# Patient Record
Sex: Male | Born: 1960 | Race: White | Hispanic: No | Marital: Married | Smoking: Never smoker
Health system: Southern US, Community
[De-identification: ages and names within clinical notes are randomized; demographics above are authoritative.]

## PROBLEM LIST (undated history)

## (undated) DIAGNOSIS — I10 Essential (primary) hypertension: Secondary | ICD-10-CM

## (undated) DIAGNOSIS — R45851 Suicidal ideations: Secondary | ICD-10-CM

## (undated) DIAGNOSIS — F419 Anxiety disorder, unspecified: Secondary | ICD-10-CM

## (undated) DIAGNOSIS — F1010 Alcohol abuse, uncomplicated: Secondary | ICD-10-CM

## (undated) DIAGNOSIS — F32A Depression, unspecified: Secondary | ICD-10-CM

## (undated) DIAGNOSIS — F329 Major depressive disorder, single episode, unspecified: Secondary | ICD-10-CM

## (undated) DIAGNOSIS — R001 Bradycardia, unspecified: Secondary | ICD-10-CM

## (undated) DIAGNOSIS — E669 Obesity, unspecified: Secondary | ICD-10-CM

## (undated) DIAGNOSIS — Z5900 Homelessness unspecified: Secondary | ICD-10-CM

## (undated) HISTORY — DX: Obesity, unspecified: E66.9

## (undated) HISTORY — DX: Bradycardia, unspecified: R00.1

## (undated) HISTORY — PX: OTHER SURGICAL HISTORY: SHX169

## (undated) HISTORY — PX: CHOLECYSTECTOMY: SHX55

## (undated) HISTORY — DX: Essential (primary) hypertension: I10

## (undated) HISTORY — PX: TONSILLECTOMY: SUR1361

## (undated) HISTORY — PX: GASTRIC BYPASS: SHX52

---

## 2005-02-04 HISTORY — PX: ROUX-EN-Y PROCEDURE: SUR1287

## 2007-04-03 ENCOUNTER — Inpatient Hospital Stay (HOSPITAL_COMMUNITY): Admission: RE | Admit: 2007-04-03 | Discharge: 2007-04-16 | Payer: Self-pay | Admitting: Psychiatry

## 2007-04-03 ENCOUNTER — Emergency Department (HOSPITAL_COMMUNITY): Admission: EM | Admit: 2007-04-03 | Discharge: 2007-04-03 | Payer: Self-pay | Admitting: Emergency Medicine

## 2007-04-03 ENCOUNTER — Ambulatory Visit: Payer: Self-pay | Admitting: Psychiatry

## 2009-07-26 ENCOUNTER — Emergency Department (HOSPITAL_COMMUNITY): Admission: EM | Admit: 2009-07-26 | Discharge: 2009-07-27 | Payer: Self-pay | Admitting: Emergency Medicine

## 2009-07-27 ENCOUNTER — Inpatient Hospital Stay (HOSPITAL_COMMUNITY): Admission: RE | Admit: 2009-07-27 | Discharge: 2009-07-31 | Payer: Self-pay | Admitting: Psychiatry

## 2009-07-27 ENCOUNTER — Ambulatory Visit: Payer: Self-pay | Admitting: Psychiatry

## 2010-04-22 LAB — COMPREHENSIVE METABOLIC PANEL
Alkaline Phosphatase: 113 U/L (ref 39–117)
BUN: 9 mg/dL (ref 6–23)
Chloride: 109 mEq/L (ref 96–112)
GFR calc non Af Amer: >60 mL/min (ref >60)
Glucose, Bld: 102 mg/dL — ABNORMAL HIGH (ref 70–99)
Potassium: 3.6 mEq/L (ref 3.5–5.1)
Total Bilirubin: 0.3 mg/dL (ref 0.3–1.2)

## 2010-04-22 LAB — DIFFERENTIAL
Basophils Absolute: 0.1 10*3/uL (ref 0.0–0.1)
Basophils Relative: 1 % (ref 0–1)
Neutro Abs: 5.5 10*3/uL (ref 1.7–7.7)
Neutrophils Relative %: 64 % (ref 43–77)

## 2010-04-22 LAB — CBC
HCT: 45 % (ref 39.0–52.0)
MCH: 30.3 pg (ref 26.0–34.0)
MCV: 85.5 fL (ref 78.0–100.0)
RBC: 5.26 MIL/uL (ref 4.22–5.81)
WBC: 8.6 10*3/uL (ref 4.0–10.5)

## 2010-04-22 LAB — RAPID URINE DRUG SCREEN, HOSP PERFORMED
Amphetamines: POSITIVE — AB
Barbiturates: NOT DETECTED
Cocaine: NOT DETECTED
Opiates: NOT DETECTED

## 2010-04-22 LAB — SALICYLATE LEVEL: Salicylate Lvl: <4 mg/dL (ref 2.8–20.0)

## 2010-06-19 NOTE — H&P (Signed)
NAME:  Derek [name], Derek [name] NO.:  [id number]   MEDICAL RECORD NO.:  [id number]          PATIENT TYPE:  IPS   LOCATION:  0501                          FACILITY:  [facility]   PHYSICIAN:  Geoffery Lyons, M.D.      DATE OF BIRTH:  [birth year]   DATE OF ADMISSION:  04/03/2007  DATE OF DISCHARGE:                       PSYCHIATRIC ADMISSION ASSESSMENT   This is a voluntary admission to the service of Dr. Geoffery Lyons.   IDENTIFYING INFORMATION:  This is a 50 year old, married, white male who  presented to the [facility] Emergency Department.  He reported he was  struggling with alcohol and depression.  He denied being suicidal.  He  states that he last saw his therapist, Dr. Owens Loffler, on Tuesday.  They were  talking about issues surrounding his mother and his mother's death and  on July 09, 2022 he realized that he was drinking bourbon at work, and he  left work to continue drinking, and went to the [employer] parking lot in  [place] and began crying.  He called his sister and his wife who  got him to the [facility] Emergency Department on Friday.  In the  emergency room, his blood alcohol level was 77.  His UDS was positive  for benzodiazepines but he is prescribed.   PAST PSYCHIATRIC HISTORY:  He completed a 25-day stay at [facility]  back in 05-Nov-2022.  He states he drank the next day.  He has taken  antidepressant medications for the past 7-8 years.  He had begun to  abuse alcohol before his mother died but it escalated after her death in  11-04-05.  Other important psychiatric information is his ex-  wife's suicide in 1997.  At that time they were separated.   SOCIAL HISTORY:  He has had a couple years of college.  He has been  married x2.  He has a 75-year-old and a 27-year-old son by this wife.  By  his prior wife, he has a 66 year old son and a 63 year old daughter.  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   FAMILY HISTORY:  His father, son, and uncle  all have alcoholism issues.  His own drinking history, he has been drinking a fifth daily for the  past 2 years.   PRIMARY CARE Johnella Crumm:  Dr. Williemae Area.   PSYCHIATRIST:  Dr. Tomasa Rand.   THERAPIST:  Michaelle Copas, Ph.D.   MEDICAL PROBLEMS:  1. He has hypertension and is treated.  2. He is status post a gastric bypass in January 2006.   CURRENT MEDICATIONS:  He is prescribed:  1. Antabuse 250 mg p.o. daily.  2. Campral 333 mg p.o. daily.  3. Diovan 80 mg p.o. daily.  4. Revia 50 mg p.o. daily.  5. Vitamin B12 p.o. daily.  6. Prozac 40 mg p.o. daily.   DRUG ALLERGIES:  No known drug allergies.   POSITIVE PHYSICAL FINDINGS:  He was medically cleared in the ED at  [facility].  As already stated, his blood alcohol level was  77.  His UDS was positive for benzos.  He has no hand tremor and no  CIWA.   Today, he denies any untoward symptoms.   MENTAL STATUS EXAMINATION:  He is alert and oriented.  He is casually  groomed in shorts and T-shirt.  He appears to be adequately nourished.  His speech is not slurred.  His mood is appropriate to the situation.  His affect has normal range.  Thought processes are clear, rational,  goal-oriented.  He realizes that he risks loosing family and employment  if he does not stop drinking.  Judgment and insight are good.  Concentration and memory are intact.  Intelligence is at least average.  He denies being suicidal or homicidal.  He denies auditory or visual  hallucinations.  He states that apparently crying is quite unusual.  He  did do that on Thursday and he wants to find out why he keeps sabotaging  himself.   DIAGNOSES:   AXIS I:  1. Alcohol dependence.  2. Alcohol withdrawal.  3. 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  4. Complicated bereavement.   AXIS II:  Deferred.   AXIS III:  1. History for gastric bypass.  2. Hypertension.   AXIS IV:  1. Severe problems with  primary support group.  2. Potential occupational issues.  3. He is status post a driving while intoxicated in November 2008, but      he does not know when his court date is.   PLAN:  1. Admit for safety and stabilization.  2. We will detox him from alcohol utilizing the low-dose Librium      protocol.  3. His Campral, Revia, and Prozac will be continued.  4. We will involve his family.  5. He will attend [facility].  6. We will consider adding a little Abilify.   ESTIMATED LENGTH OF STAY:  Four to six days.      Mickie Leonarda Salon, P.A.-C.      Geoffery Lyons, M.D.  Electronically Signed    MD/MEDQ  D:  04/04/2007  T:  04/05/2007  Job:  [id number]

## 2010-06-22 NOTE — Discharge Summary (Signed)
NAME:  Derek [name], ATES NO.:  [id number]   MEDICAL RECORD NO.:  [id number]          PATIENT TYPE:  IPS   LOCATION:  0501                          FACILITY:  [facility]   PHYSICIAN:  Geoffery Lyons, M.D.      DATE OF BIRTH:  1961-02-02   DATE OF ADMISSION:  04/03/2007  DATE OF DISCHARGE:  04/16/2007                               DISCHARGE SUMMARY   CHIEF COMPLAINT:  This was the first admission to [facility] for this 50 year-old, married, white male who presented to [facility] Emergency Department.  He reported he was struggling with alcohol  and depression.  He denied being suicidal.  He stated that he saw his  therapist, Dr. Owens Loffler on Tuesday.  They were talking about issues  surrounding his mother and his mother's death.  On 07-05-22,  he  realized that he was drinking at work.  He left work to continue  drinking and went to a [employer] parking lot in [place] and began  crying.  He called his sister and his wife who got him to [facility].  In the emergency room, the alcohol level was 77.   PAST PSYCHIATRIC HISTORY:  He had a 25-day stay in 11/01/22 and  although, he drank the next day.  He has been taking antidepressants for  the past 7-8 years.  He began to abuse alcohol before his mother died,  but escalated after her death in 31-Oct-2005.  Their are issues also  of his ex-wife's suicide in 1977.  At that time, they were separated.   FAMILY HISTORY:  Positive for alcohol dependence.   ALCOHOL AND DRUG HISTORY:  As already stated.  Persistent use of alcohol  drinking a fifth daily for the past 2 years.  Currently, seeing Dr.  Tomasa Rand.   PAST MEDICAL HISTORY:  1. Hypertension.  2. Status post gastric bypass in January 2006.   MEDICATIONS:  1. Antabuse 250 mg per day.  2. Compro 233 mg daily, 2-3 times a day.  3. Diovan 80 mg per day.  4. ReVia 50 mg per day.  5. Prozac 40 mg per day.   PHYSICAL EXAMINATION:  Physical exam failed  to show any acute findings.   LABORATORY DATA AND X-RAY FINDINGS:  Alcohol level 77.  UDS positive for  benzodiazepines.   MENTAL STATUS EXAM:  An alert, cooperative male, casually groomed,  adequately nourished.  Speech is normal rate, tempo and production.  Mood is depressed.  Affect depressed.  Thought process is clear,  rational and goal oriented.  Endorsed that he realized the risk of  losing his family as well as his job if he does not stop drinking.  No  active suicidal or homicidal ideas, no delusions, feeling very  overwhelmed. A lot of issues of loss and grief for the mother.  The ex-  wife died after committing suicide.  No hallucinations.  Cognition well  preserved.   ADMISSION DIAGNOSES:  AXIS I:  1.  Alcohol dependence.  1. Major depression, recurrent.  AXIS II:  No diagnosis.  AXIS III:  1.  History of gastric bypass.  1. Hypertension.  AXIS IV: Moderate.  AXIS V:  On admission 35; GAF in the last year 80.   COURSE IN THE HOSPITAL:  He was admitted and started in individual and  group psychotherapy. We pursued detox with Librium.  Initially, he was  placed on Abilify.  It was discontinued and we start working with  Seroquel.  As already stated, he is dealing with a lot of losses.  He  was able to open up and talk about the losses in his life.  There is a  lot of guilt associated to them.  Concerned that if he does not do  something about his life, he is going to lose his job and his family.  He has depressed mood dealing with the grief and his ex-wife's suicide  and loss of his mother.  Endorsed that he is not the same with lots of  self-doubt, a sense of hope and helplessness, very overwhelmed.  He  claimed he lost himself.  He relapsed shortly after coming out of  hall.  Several medication trials with Wellbutrin and Lexapro and  switched to Prozac.  He continued to deal with the loss and depression.  He slept through the morning, endorsed feeling sluggish, more  down than  usual.  He went to the loss group.  He was able to get, as he claims,  some good hints from the group in terms of thinking about what is he  missing from his mother.  Still hold on to the loss and unsure of what  has been taking so long to let go.  He did intellectualize and  rationalized.  He was focusing on the loss and minimizing the alcohol.  At that particular time, we held the Abilify due to the sluggishness and  considering adding the Wellbutrin to the Prozac.  By March 4, there was  increased pressure from his job in terms of getting an estimated length  of absence.  They were wanting to get him to take a less stressful  position at the job.  On March 4, he slept better and was more energetic  in the morning and went to group.  Then, he suddenly started feeling  depressed, down and started crying having to leave the group.  He was  found in a corner sitting and crying and endorsed he was talking to his  mother in his mind asking what to do.  He was able to talk about guilt,  about the wife's suicide, tending to intellectualize, needing a lot of  approval, apologizing for any displaced emotion.  He would not  acknowledge anger.  He then was involved in some behaviors stating that  he was not benefiting from the group, but yet able to say that in the  group, he was able to start dealing with some things, something  triggered the display of the emotion, but he is very uncomfortable with  it.  He did say that by now he would be drinking if he was to face this  state of affairs at home.  There was a family session with the wife.  He  continued to intellectualize, very controlled and uncomfortable  displaying emotions.  He was approaching his situation as he would  approach work in that he would like to fix it.  He does not feel that  he speaks on the time-frame he should be.  He did start projecting a lot  of anger.  He was seen by the psychiatric intern and the counselors  and  the Pharm-D.  There were definitely some characterological issues that  surfaced.  We worked with the Prozac and the Wellbutrin.  On March 6,  the psychologist met with him.  He was distressed about the realization  that he coped with feelings of inadequacy by putting on a front.  On  March 6, he endorsed he did not sleep at all last night thinking  ruminating, worrying and catastrophizing.  He wanted to address the  core issues that he has been helped to identify with while in this  unit, a sense of hopelessness and helplessness.  On March 9, he was  still very anxious.  Continues to admit that he is very depressed.  He  stated that he has tried to keep his defenses down and deny the way he  really feels and put on a front, but endorsed that he could not do this  anymore, feeling very overwhelmed.  Depressed mood with depressed  affect, tearful, hopeless, helpless, worried about going home, unable to  sleep due to the worries, concerns and ruminations.  We continued to  work with the Seroquel.  On March 10, endorsed overwhelming anxiety.  He  wakes up with anxiety, catastrophizing.  He requested the use of a  benzodiazepine short-term well aware of the dependency issues, but the  feeling was that if this was not addressed, that he was not able to move  forward.  We instituted a trial with a small amount of Klonopin on as a  needed basis, especially in the morning.  We upped our offerings in  terms of coping skills.  On March 11, he stayed in bed most of the  morning.  He was grieving the loss of his job as he knew.  It happened  that he was going to continue employment, but his responsibility was  going to be different.  He was probably going to continue on the same  salary, but he was not going to have as much responsibility.  A lot of  his self-esteem was tied up to this job and this was just one more loss.  The Klonopin did help.  He continued to evidence some characterological   issues and cognitive distortion that we addressed.  On March 12, he was  in full contact reality, no overt withdrawal.  After a lot of thinking  and going back and forth, the family was able to get some money  together.  He felt that he was ready to go deep and deal with his  issues.  He was not suicidal or homicidal.  He was excited to be able to  go into a residential program that might help him address his core  issues.   DISCHARGE DIAGNOSES:  AXIS I:  1.  Alcohol dependence.  1. Major depression, recurrent.  2. Anxiety disorder, not otherwise specified.  AXIS II:  Personality disorder, not otherwise specified.  AXIS III:  1.  Gastric bypass.  1. Hypertension.  AXIS IV:  Moderate.  AXIS V:  Upon discharge, 50.   DISCHARGE MEDICATIONS:  1. Diovan 80 mg per day.  2. ReVia 50 mg per day.  3. Prozac 60 mg per day.  4. Wellbutrin XL 300 mg daily.  5. Seroquel 100 at bedtime.   FOLLOW UP:  Follow up at [facility].  After discharge, he will pursue  outpatient treatment with Dr. Tomasa Rand.  Geoffery Lyons, M.D.  Electronically Signed     IL/MEDQ  D:  05/14/2007  T:  05/14/2007  Job:  [id number]

## 2010-10-26 LAB — RAPID URINE DRUG SCREEN, HOSP PERFORMED
Amphetamines: NOT DETECTED
Barbiturates: NOT DETECTED

## 2010-10-26 LAB — DIFFERENTIAL
Lymphocytes Relative: 16
Monocytes Absolute: 0.5
Monocytes Relative: 6
Neutro Abs: 7.2

## 2010-10-26 LAB — URINALYSIS, ROUTINE W REFLEX MICROSCOPIC
Hgb urine dipstick: NEGATIVE
Nitrite: NEGATIVE
Protein, ur: NEGATIVE
Urobilinogen, UA: 1

## 2010-10-26 LAB — CBC
Hemoglobin: 15.8
MCHC: 34.7
RBC: 5.11

## 2010-10-26 LAB — BASIC METABOLIC PANEL
CO2: 27
Calcium: 9.3
GFR calc Af Amer: >60
Sodium: 142

## 2010-10-29 LAB — HEPATIC FUNCTION PANEL
ALT: 20
AST: 26
Alkaline Phosphatase: 62
Bilirubin, Direct: 0.2
Total Bilirubin: 0.7

## 2015-02-05 HISTORY — PX: ROTATOR CUFF REPAIR: SHX139

## 2015-03-13 ENCOUNTER — Other Ambulatory Visit: Payer: Self-pay | Admitting: Orthopedic Surgery

## 2015-03-13 DIAGNOSIS — M67911 Unspecified disorder of synovium and tendon, right shoulder: Secondary | ICD-10-CM

## 2015-03-22 ENCOUNTER — Ambulatory Visit
Admission: RE | Admit: 2015-03-22 | Discharge: 2015-03-22 | Disposition: A | Discharge status: Home / Self Care | Payer: 59 | Source: Ambulatory Visit | Attending: Orthopedic Surgery | Admitting: Orthopedic Surgery

## 2015-03-22 DIAGNOSIS — M67911 Unspecified disorder of synovium and tendon, right shoulder: Secondary | ICD-10-CM

## 2015-03-30 ENCOUNTER — Other Ambulatory Visit: Payer: Self-pay | Admitting: Orthopedic Surgery

## 2015-04-13 ENCOUNTER — Encounter (HOSPITAL_BASED_OUTPATIENT_CLINIC_OR_DEPARTMENT_OTHER): Payer: Self-pay | Admitting: *Deleted

## 2015-04-13 NOTE — Progress Notes (Signed)
Bring all medications. Can not come for labs before surgery - going out of town. Explained will get labs and Ekg Monday.

## 2015-04-17 ENCOUNTER — Encounter (HOSPITAL_BASED_OUTPATIENT_CLINIC_OR_DEPARTMENT_OTHER)
Admission: RE | Disposition: A | Discharge status: Home / Self Care | Payer: Self-pay | Source: Ambulatory Visit | Attending: Orthopedic Surgery

## 2015-04-17 ENCOUNTER — Encounter (HOSPITAL_BASED_OUTPATIENT_CLINIC_OR_DEPARTMENT_OTHER): Payer: Self-pay

## 2015-04-17 ENCOUNTER — Ambulatory Visit (HOSPITAL_BASED_OUTPATIENT_CLINIC_OR_DEPARTMENT_OTHER): Payer: 59 | Admitting: Anesthesiology

## 2015-04-17 ENCOUNTER — Ambulatory Visit (HOSPITAL_BASED_OUTPATIENT_CLINIC_OR_DEPARTMENT_OTHER)
Admission: RE | Admit: 2015-04-17 | Discharge: 2015-04-17 | Disposition: A | Discharge status: Home / Self Care | Payer: 59 | Source: Ambulatory Visit | Attending: Orthopedic Surgery | Admitting: Orthopedic Surgery

## 2015-04-17 DIAGNOSIS — Z6835 Body mass index (BMI) 35.0-35.9, adult: Secondary | ICD-10-CM | POA: Diagnosis not present

## 2015-04-17 DIAGNOSIS — I10 Essential (primary) hypertension: Secondary | ICD-10-CM | POA: Diagnosis not present

## 2015-04-17 DIAGNOSIS — M75111 Incomplete rotator cuff tear or rupture of right shoulder, not specified as traumatic: Secondary | ICD-10-CM | POA: Diagnosis not present

## 2015-04-17 HISTORY — PX: SHOULDER ARTHROSCOPY WITH SUBACROMIAL DECOMPRESSION: SHX5684

## 2015-04-17 HISTORY — DX: Essential (primary) hypertension: I10

## 2015-04-17 HISTORY — DX: Depression, unspecified: F32.A

## 2015-04-17 HISTORY — DX: Major depressive disorder, single episode, unspecified: F32.9

## 2015-04-17 LAB — POCT I-STAT, CHEM 8
BUN: 7 mg/dL (ref 6–20)
CALCIUM ION: 1.04 mmol/L — AB (ref 1.12–1.23)
CHLORIDE: 105 mmol/L (ref 101–111)
CREATININE: 1 mg/dL (ref 0.61–1.24)
GLUCOSE: 109 mg/dL — AB (ref 65–99)
HCT: 46 % (ref 39.0–52.0)
Hemoglobin: 15.6 g/dL (ref 13.0–17.0)
POTASSIUM: 3.7 mmol/L (ref 3.5–5.1)
Sodium: 143 mmol/L (ref 135–145)
TCO2: 23 mmol/L (ref 0–100)

## 2015-04-17 SURGERY — SHOULDER ARTHROSCOPY WITH SUBACROMIAL DECOMPRESSION
Anesthesia: General | Site: Shoulder | Laterality: Right

## 2015-04-17 MED ORDER — EPHEDRINE SULFATE 50 MG/ML IJ SOLN
INTRAMUSCULAR | Status: DC | PRN
  Administered 2015-04-17 (×2): 10 mg via INTRAVENOUS

## 2015-04-17 MED ORDER — MIDAZOLAM HCL 2 MG/2ML IJ SOLN
1.0000 mg | INTRAMUSCULAR | Status: DC | PRN
  Administered 2015-04-17: 2 mg via INTRAVENOUS

## 2015-04-17 MED ORDER — BUPIVACAINE-EPINEPHRINE (PF) 0.5% -1:200000 IJ SOLN
INTRAMUSCULAR | Status: DC | PRN
  Administered 2015-04-17: 30 mL via PERINEURAL

## 2015-04-17 MED ORDER — SUCCINYLCHOLINE CHLORIDE 20 MG/ML IJ SOLN
INTRAMUSCULAR | Status: AC
  Filled 2015-04-17: qty 1

## 2015-04-17 MED ORDER — SODIUM CHLORIDE 0.9 % IR SOLN
Status: DC | PRN
  Administered 2015-04-17: 3000 mL

## 2015-04-17 MED ORDER — DEXAMETHASONE SODIUM PHOSPHATE 4 MG/ML IJ SOLN
INTRAMUSCULAR | Status: DC | PRN
  Administered 2015-04-17: 10 mg via INTRAVENOUS

## 2015-04-17 MED ORDER — MIDAZOLAM HCL 5 MG/5ML IJ SOLN
INTRAMUSCULAR | Status: DC | PRN
  Administered 2015-04-17: 2 mg via INTRAVENOUS

## 2015-04-17 MED ORDER — DOCUSATE SODIUM 100 MG PO CAPS: 100.0000 mg | ORAL_CAPSULE | Freq: Three times a day (TID) | ORAL | Status: DC | PRN

## 2015-04-17 MED ORDER — CEFAZOLIN SODIUM 1-5 GM-% IV SOLN
INTRAVENOUS | Status: AC
  Filled 2015-04-17: qty 50

## 2015-04-17 MED ORDER — LACTATED RINGERS IV SOLN: INTRAVENOUS | Status: DC

## 2015-04-17 MED ORDER — CEFAZOLIN SODIUM-DEXTROSE 2-3 GM-% IV SOLR
INTRAVENOUS | Status: AC
  Filled 2015-04-17: qty 50

## 2015-04-17 MED ORDER — FENTANYL CITRATE (PF) 100 MCG/2ML IJ SOLN
INTRAMUSCULAR | Status: DC | PRN
  Administered 2015-04-17: 100 ug via INTRAVENOUS

## 2015-04-17 MED ORDER — EPHEDRINE SULFATE 50 MG/ML IJ SOLN
INTRAMUSCULAR | Status: AC
  Filled 2015-04-17: qty 1

## 2015-04-17 MED ORDER — FENTANYL CITRATE (PF) 100 MCG/2ML IJ SOLN
50.0000 ug | INTRAMUSCULAR | Status: DC | PRN
  Administered 2015-04-17: 100 ug via INTRAVENOUS

## 2015-04-17 MED ORDER — SUCCINYLCHOLINE CHLORIDE 20 MG/ML IJ SOLN
INTRAMUSCULAR | Status: DC | PRN
  Administered 2015-04-17: 100 mg via INTRAVENOUS

## 2015-04-17 MED ORDER — PHENYLEPHRINE 40 MCG/ML (10ML) SYRINGE FOR IV PUSH (FOR BLOOD PRESSURE SUPPORT)
PREFILLED_SYRINGE | INTRAVENOUS | Status: AC
  Filled 2015-04-17: qty 10

## 2015-04-17 MED ORDER — SCOPOLAMINE 1 MG/3DAYS TD PT72: 1.0000 | MEDICATED_PATCH | Freq: Once | TRANSDERMAL | Status: DC | PRN

## 2015-04-17 MED ORDER — DEXAMETHASONE SODIUM PHOSPHATE 10 MG/ML IJ SOLN
INTRAMUSCULAR | Status: AC
  Filled 2015-04-17: qty 1

## 2015-04-17 MED ORDER — GLYCOPYRROLATE 0.2 MG/ML IJ SOLN
INTRAMUSCULAR | Status: AC
  Filled 2015-04-17: qty 1

## 2015-04-17 MED ORDER — FENTANYL CITRATE (PF) 100 MCG/2ML IJ SOLN
INTRAMUSCULAR | Status: AC
  Filled 2015-04-17: qty 2

## 2015-04-17 MED ORDER — ONDANSETRON HCL 4 MG/2ML IJ SOLN
INTRAMUSCULAR | Status: DC | PRN
  Administered 2015-04-17: 4 mg via INTRAVENOUS

## 2015-04-17 MED ORDER — PROPOFOL 10 MG/ML IV BOLUS
INTRAVENOUS | Status: AC
  Filled 2015-04-17: qty 20

## 2015-04-17 MED ORDER — LIDOCAINE HCL (CARDIAC) 20 MG/ML IV SOLN
INTRAVENOUS | Status: AC
  Filled 2015-04-17: qty 5

## 2015-04-17 MED ORDER — MIDAZOLAM HCL 2 MG/2ML IJ SOLN
INTRAMUSCULAR | Status: AC
  Filled 2015-04-17: qty 2

## 2015-04-17 MED ORDER — POVIDONE-IODINE 7.5 % EX SOLN: Freq: Once | CUTANEOUS | Status: DC

## 2015-04-17 MED ORDER — OXYCODONE-ACETAMINOPHEN 5-325 MG PO TABS: 1.0000 | ORAL_TABLET | ORAL | Status: DC | PRN

## 2015-04-17 MED ORDER — LACTATED RINGERS IV SOLN
INTRAVENOUS | Status: DC | PRN
  Administered 2015-04-17 (×2): via INTRAVENOUS

## 2015-04-17 MED ORDER — CEFAZOLIN SODIUM-DEXTROSE 2-3 GM-% IV SOLR
2.0000 g | INTRAVENOUS | Status: AC
  Administered 2015-04-17: 3 g via INTRAVENOUS

## 2015-04-17 MED ORDER — LIDOCAINE HCL (CARDIAC) 20 MG/ML IV SOLN
INTRAVENOUS | Status: DC | PRN
  Administered 2015-04-17: 50 mg via INTRAVENOUS

## 2015-04-17 MED ORDER — PROPOFOL 10 MG/ML IV BOLUS
INTRAVENOUS | Status: DC | PRN
  Administered 2015-04-17: 300 mg via INTRAVENOUS

## 2015-04-17 MED ORDER — GLYCOPYRROLATE 0.2 MG/ML IJ SOLN: 0.2000 mg | Freq: Once | INTRAMUSCULAR | Status: DC | PRN

## 2015-04-17 SURGICAL SUPPLY — 80 items
BENZOIN TINCTURE PRP APPL 2/3 (GAUZE/BANDAGES/DRESSINGS) IMPLANT
BLADE CLIPPER SURG (BLADE) IMPLANT
BLADE SURG 15 STRL LF DISP TIS (BLADE) IMPLANT
BLADE SURG 15 STRL SS (BLADE)
BUR OVAL 4.0 (BURR) ×2 IMPLANT
CANNULA 5.75X71 LONG (CANNULA) ×2 IMPLANT
CANNULA TWIST IN 8.25X7CM (CANNULA) IMPLANT
CHLORAPREP W/TINT 26ML (MISCELLANEOUS) ×2 IMPLANT
DECANTER SPIKE VIAL GLASS SM (MISCELLANEOUS) IMPLANT
DRAPE IMP U-DRAPE 54X76 (DRAPES) ×2 IMPLANT
DRAPE INCISE IOBAN 66X45 STRL (DRAPES) ×2 IMPLANT
DRAPE STERI 35X30 U-POUCH (DRAPES) ×2 IMPLANT
DRAPE SURG 17X23 STRL (DRAPES) ×2 IMPLANT
DRAPE U-SHAPE 47X51 STRL (DRAPES) ×2 IMPLANT
DRAPE U-SHAPE 76X120 STRL (DRAPES) ×4 IMPLANT
DRSG PAD ABDOMINAL 8X10 ST (GAUZE/BANDAGES/DRESSINGS) ×2 IMPLANT
ELECT REM PT RETURN 9FT ADLT (ELECTROSURGICAL) ×2
ELECTRODE REM PT RTRN 9FT ADLT (ELECTROSURGICAL) ×1 IMPLANT
GAUZE SPONGE 4X4 12PLY STRL (GAUZE/BANDAGES/DRESSINGS) ×2 IMPLANT
GAUZE SPONGE 4X4 16PLY XRAY LF (GAUZE/BANDAGES/DRESSINGS) IMPLANT
GAUZE XEROFORM 1X8 LF (GAUZE/BANDAGES/DRESSINGS) ×2 IMPLANT
GLOVE BIO SURGEON STRL SZ7 (GLOVE) ×2 IMPLANT
GLOVE BIO SURGEON STRL SZ7.5 (GLOVE) ×2 IMPLANT
GLOVE BIOGEL PI IND STRL 7.0 (GLOVE) ×2 IMPLANT
GLOVE BIOGEL PI IND STRL 8 (GLOVE) ×1 IMPLANT
GLOVE BIOGEL PI INDICATOR 7.0 (GLOVE) ×2
GLOVE BIOGEL PI INDICATOR 8 (GLOVE) ×1
GLOVE ECLIPSE 6.5 STRL STRAW (GLOVE) ×2 IMPLANT
GOWN STRL REUS W/ TWL LRG LVL3 (GOWN DISPOSABLE) ×2 IMPLANT
GOWN STRL REUS W/ TWL XL LVL3 (GOWN DISPOSABLE) ×1 IMPLANT
GOWN STRL REUS W/TWL LRG LVL3 (GOWN DISPOSABLE) ×2
GOWN STRL REUS W/TWL XL LVL3 (GOWN DISPOSABLE) ×1
LASSO CRESCENT QUICKPASS (SUTURE) IMPLANT
LIQUID BAND (GAUZE/BANDAGES/DRESSINGS) IMPLANT
MANIFOLD NEPTUNE II (INSTRUMENTS) ×2 IMPLANT
NDL SUT 6 .5 CRC .975X.05 MAYO (NEEDLE) IMPLANT
NEEDLE 1/2 CIR CATGUT .05X1.09 (NEEDLE) IMPLANT
NEEDLE MAYO TAPER (NEEDLE)
NEEDLE SCORPION MULTI FIRE (NEEDLE) IMPLANT
NS IRRIG 1000ML POUR BTL (IV SOLUTION) IMPLANT
PACK ARTHROSCOPY DSU (CUSTOM PROCEDURE TRAY) ×2 IMPLANT
PACK BASIN DAY SURGERY FS (CUSTOM PROCEDURE TRAY) ×2 IMPLANT
PENCIL BUTTON HOLSTER BLD 10FT (ELECTRODE) IMPLANT
RESECTOR FULL RADIUS 4.2MM (BLADE) ×2 IMPLANT
SHEET MEDIUM DRAPE 40X70 STRL (DRAPES) IMPLANT
SLEEVE SCD COMPRESS KNEE MED (MISCELLANEOUS) ×2 IMPLANT
SLING ARM FOAM STRAP LRG (SOFTGOODS) IMPLANT
SLING ARM FOAM STRAP XLG (SOFTGOODS) ×2 IMPLANT
SLING ARM IMMOBILIZER MED (SOFTGOODS) IMPLANT
SLING ARM MED ADULT FOAM STRAP (SOFTGOODS) IMPLANT
SLING ARM XL FOAM STRAP (SOFTGOODS) IMPLANT
SPONGE LAP 4X18 X RAY DECT (DISPOSABLE) IMPLANT
STRIP CLOSURE SKIN 1/2X4 (GAUZE/BANDAGES/DRESSINGS) IMPLANT
SUCTION FRAZIER HANDLE 10FR (MISCELLANEOUS)
SUCTION TUBE FRAZIER 10FR DISP (MISCELLANEOUS) IMPLANT
SUPPORT WRAP ARM LG (MISCELLANEOUS) IMPLANT
SUT BONE WAX W31G (SUTURE) IMPLANT
SUT ETHIBOND 2 OS 4 DA (SUTURE) IMPLANT
SUT ETHILON 3 0 PS 1 (SUTURE) ×2 IMPLANT
SUT ETHILON 4 0 PS 2 18 (SUTURE) IMPLANT
SUT FIBERWIRE #2 38 T-5 BLUE (SUTURE)
SUT MNCRL AB 3-0 PS2 18 (SUTURE) IMPLANT
SUT MNCRL AB 4-0 PS2 18 (SUTURE) IMPLANT
SUT PDS AB 0 CT 36 (SUTURE) IMPLANT
SUT PROLENE 3 0 PS 2 (SUTURE) IMPLANT
SUT TIGER TAPE 7 IN WHITE (SUTURE) IMPLANT
SUT VIC AB 0 CT1 27 (SUTURE)
SUT VIC AB 0 CT1 27XBRD ANBCTR (SUTURE) IMPLANT
SUT VIC AB 2-0 SH 27 (SUTURE)
SUT VIC AB 2-0 SH 27XBRD (SUTURE) IMPLANT
SUTURE FIBERWR #2 38 T-5 BLUE (SUTURE) IMPLANT
SYR BULB 3OZ (MISCELLANEOUS) IMPLANT
TAPE FIBER 2MM 7IN #2 BLUE (SUTURE) IMPLANT
TOWEL OR 17X24 6PK STRL BLUE (TOWEL DISPOSABLE) ×2 IMPLANT
TOWEL OR NON WOVEN STRL DISP B (DISPOSABLE) ×2 IMPLANT
TUBE CONNECTING 20X1/4 (TUBING) ×2 IMPLANT
TUBING ARTHROSCOPY IRRIG 16FT (MISCELLANEOUS) ×2 IMPLANT
WAND STAR VAC 90 (SURGICAL WAND) ×2 IMPLANT
WATER STERILE IRR 1000ML POUR (IV SOLUTION) ×2 IMPLANT
YANKAUER SUCT BULB TIP NO VENT (SUCTIONS) IMPLANT

## 2015-04-17 NOTE — Op Note (Signed)
Procedure(s): SHOULDER ARTHROSCOPY ROTATOR CUFF DEBRIDEMENT WITH SUBACROMIAL DECOMPRESSION Procedure Note  Derek [name] male 55 y.o. 04/17/2015  Procedure(s) and Anesthesia Type:    #1 arthroscopic debridement right type I SLAP tear, debridement partial thickness rotator cuff tear  #2 arthroscopic right subacromial decompression  Surgeon(s) and Role:    * Jones Broom, MD - Primary     Surgeon: Mable Paris   Assistants: Damita Lack PA-C (Danielle was present and scrubbed throughout the procedure and was essential in positioning, assisting with the camera and instrumentation,, and closure)  Anesthesia: General endotracheal anesthesia with preoperative interscalene block given by the attending anesthesiologist     Procedure Detail  SHOULDER ARTHROSCOPY ROTATOR CUFF DEBRIDEMENT WITH SUBACROMIAL DECOMPRESSION  Estimated Blood Loss: Min         Drains: none  Blood Given: none         Specimens: none        Complications:  * No complications entered in OR log *         Disposition: PACU - hemodynamically stable.         Condition: stable    Procedure:   INDICATIONS FOR SURGERY: The patient is 55 y.o. male who has had a long history proximally 6 months of right shoulder pain, failed conservative management. MRI showed some partial-thickness rotator cuff tearing. Ultimately indicated for surgical treatment to try and decrease pain and restore function.  OPERATIVE FINDINGS: Examination under anesthesia: No stiffness or instability.   DESCRIPTION OF PROCEDURE: The patient was identified in preoperative  holding area where I personally marked the operative site after  verifying site, side, and procedure with the patient. An interscalene block was given by the attending anesthesiologist the holding area.  The patient was taken back to the operating room where general anesthesia was induced without complication and was placed in the beach-chair position  with the back  elevated about 60 degrees and all extremities and head and neck carefully padded and  positioned.   The right upper extremity was then prepped and  draped in a standard sterile fashion. The appropriate time-out  procedure was carried out. The patient did receive IV antibiotics  within 30 minutes of incision.   A small posterior portal incision was made and the arthroscope was introduced into the joint. An anterior portal was then established above the subscapularis using needle localization. Small cannula was placed anteriorly. Diagnostic arthroscopy was then carried out.  He was noted to have some extensive partial tearing of the superior and anterior labrum. This was extensively debrided with a shaver back to healthy stable labrum. The biceps anchor was not significantly involved. The biceps tendon was pulled into the joint and noted to be completely intact. Clinic humeral joint surfaces were intact without significant chondromalacia. Posterior cuff was intact. Superiorly he was noted to have an interesting longitudinal split about 5 or 6 mm back from the biceps. This extended about 15 mm medial to the insertion. There is a few articular sided strands and then some intrasubstance tearing of the rotator cuff. The shaver was used to remove some of the thinned articular sided strands and debride the inner portion of the intrasubstance tear. The tendon posterior and anterior to this area was intact and healthy-appearing.  The arthroscope was then introduced into the subacromial space a standard lateral portal was established with needle localization. The shaver was used through the lateral portal to perform extensive bursectomy. Coracoacromial ligament was examined and found to be severely frayed indicating chronic  impingement.  The bursal side of the rotator cuff was carefully examined. There is a small area and the area of impingement that was thin and but after careful debridement and  probing it was clear that there was no exposed tuberosity and I did not feel it was thin enough for formal takedown and repair here.  The coracoacromial ligament was taken down off the anterior acromion with the ArthroCare exposing a moderate hooked anterior acromial spur. A high-speed bur was then used through the lateral portal to take down the anterior acromial spur from lateral to medial in a standard acromioplasty.  The acromioplasty was also viewed from the lateral portal and the bur was used as necessary to ensure that the acromion was completely flat from posterior to anterior.   The arthroscopic equipment was removed from the joint and the portals were closed with 3-0 nylon in an interrupted fashion. Sterile dressings were then applied including Xeroform 4 x 4's ABDs and tape. The patient was then allowed to awaken from general anesthesia, placed in a sling, transferred to the stretcher and taken to the recovery room in stable condition.   POSTOPERATIVE PLAN: The patient will be discharged home today and will followup in one week for suture removal and wound check.  We will get him into therapy right away.

## 2015-04-17 NOTE — Progress Notes (Signed)
Assisted Dr. Crews with right, ultrasound guided, interscalene  block. Side rails up, monitors on throughout procedure. See vital signs in flow sheet. Tolerated Procedure well. 

## 2015-04-17 NOTE — Anesthesia Postprocedure Evaluation (Signed)
Anesthesia Post Note  Patient: Derek ConchJames Yeatts  Procedure(s) Performed: Procedure(s) (LRB): SHOULDER ARTHROSCOPY ROTATOR CUFF DEBRIDEMENT WITH SUBACROMIAL DECOMPRESSION (Right)  Patient location during evaluation: PACU Anesthesia Type: General Level of consciousness: awake and alert Pain management: pain level controlled Vital Signs Assessment: post-procedure vital signs reviewed and stable Respiratory status: spontaneous breathing, nonlabored ventilation and respiratory function stable Cardiovascular status: blood pressure returned to baseline and stable Postop Assessment: no signs of nausea or vomiting Anesthetic complications: no    Last Vitals:  Filed Vitals:   04/17/15 1430 04/17/15 1443  BP: 127/90 127/91  Pulse: 69 70  Temp:    Resp: 15 19    Last Pain:  Filed Vitals:   04/17/15 1447  PainSc: 0-No pain                 [name]

## 2015-04-17 NOTE — H&P (Signed)
Derek [name] is an 55 y.o. male.   Chief Complaint: Right shoulder pain HPI: 6 month history of right shoulder pain which has failed conservative management with injections, activity modification, medications. MRI shows partial-thickness articular supraspinatus tearing with some degenerative changes in the joint. Indicated for surgical treatment to try and decrease pain and restore function.  Past Medical History  Diagnosis Date  . Hypertension   . Depression     Past Surgical History  Procedure Laterality Date  . Gastric bypass      [facility]  . Cholecystectomy    . Tonsillectomy    . Wisdom teeth extractions      History reviewed. No pertinent family history. Social History:  reports that he has never smoked. He does not have any smokeless tobacco history on file. He reports that he does not drink alcohol or use illicit drugs.  Allergies: No Known Allergies  Medications Prior to Admission  Medication Sig Dispense Refill  . AMPHETAMINE SULFATE PO Take by mouth.    . desvenlafaxine (PRISTIQ) 50 MG 24 hr tablet Take 50 mg by mouth daily.    Marland Kitchen. DOXAZOSIN MESYLATE PO Take 10 mg by mouth.    . valsartan (DIOVAN) 80 MG tablet Take 80 mg by mouth daily.      Results for orders placed or performed during the hospital encounter of 04/17/15 (from the past 48 hour(s))  I-STAT, chem 8     Status: Abnormal   Collection Time: 04/17/15 11:39 AM  Result Value Ref Range   Sodium 143 135 - 145 mmol/L   Potassium 3.7 3.5 - 5.1 mmol/L   Chloride 105 101 - 111 mmol/L   BUN 7 6 - 20 mg/dL   Creatinine, Ser 1.611.00 0.61 - 1.24 mg/dL   Glucose, Bld 096109 (H) 65 - 99 mg/dL   Calcium, Ion 0.451.04 (L) 1.12 - 1.23 mmol/L   TCO2 23 0 - 100 mmol/L   Hemoglobin 15.6 13.0 - 17.0 g/dL   HCT 40.946.0 81.139.0 - 91.452.0 %   No results found.  Review of Systems  All other systems reviewed and are negative.   Blood pressure 154/95, pulse 83, temperature 98.3 F (36.8 C), temperature source Oral, resp. rate  19, height 6\' 1"  (1.854 m), weight 123.492 kg (272 lb 4 oz), SpO2 99 %. Physical Exam  Constitutional: He is oriented to person, place, and time. He appears well-developed and well-nourished.  HENT:  Head: Atraumatic.  Eyes: EOM are normal.  Cardiovascular: Intact distal pulses.   Respiratory: Effort normal.  Musculoskeletal:  Right shoulder pain with rotator cuff testing.  Neurological: He is alert and oriented to person, place, and time.  Skin: Skin is warm and dry.  Psychiatric: He has a normal mood and affect.     Assessment/Plan Right shoulder articular sided rotator cuff partial tear with impingement, failed conservative management Plan right shoulder arthroscopic debridement versus repair with subacromial decompression Risks / benefits of surgery discussed Consent on chart  NPO for OR Preop antibiotics   Mable ParisHANDLER,Cherica Heiden WILLIAM, MD 04/17/2015, 12:51 PM

## 2015-04-17 NOTE — Transfer of Care (Signed)
Immediate Anesthesia Transfer of Care Note  Patient: Derek [name]  Procedure(s) Performed: Procedure(s): SHOULDER ARTHROSCOPY ROTATOR CUFF DEBRIDEMENT WITH SUBACROMIAL DECOMPRESSION (Right)  Patient Location: PACU  Anesthesia Type:GA combined with regional for post-op pain  Level of Consciousness: sedated  Airway & Oxygen Therapy: Patient Spontanous Breathing and Patient connected to face mask oxygen  Post-op Assessment: Report given to RN and Post -op Vital signs reviewed and stable  Post vital signs: Reviewed and stable  Last Vitals:  Filed Vitals:   04/17/15 1230 04/17/15 1245  BP: 170/97 163/93  Pulse: 82 81  Temp:    Resp: 16 16    Complications: No apparent anesthesia complications

## 2015-04-17 NOTE — Anesthesia Procedure Notes (Addendum)
Anesthesia Regional Block:  Interscalene brachial plexus block  Pre-Anesthetic Checklist: ,, timeout performed, Correct Patient, Correct Site, Correct Laterality, Correct Procedure, Correct Position, site marked, Risks and benefits discussed,  Surgical consent,  Pre-op evaluation,  At surgeon's request and post-op pain management  Laterality: Right and Upper  Prep: chloraprep       Needles:  Injection technique: Single-shot  Needle Type: Echogenic Needle     Needle Length: 5cm 5 cm Needle Gauge: 21 and 21 G    Additional Needles:  Procedures: ultrasound guided (picture in chart) Interscalene brachial plexus block Narrative:  Start time: 04/17/2015 12:20 PM End time: 04/17/2015 12:26 PM Injection made incrementally with aspirations every 5 mL.  Performed by: Personally  Anesthesiologist: CREWS, DAVID   Procedure Name: Intubation Date/Time: 04/17/2015 1:02 PM Performed by: [place] DesanctisLINKA, Keshona Kartes L Pre-anesthesia Checklist: Patient identified, Emergency Drugs available, Suction available, Patient being monitored and Timeout performed Patient Re-evaluated:Patient Re-evaluated prior to inductionOxygen Delivery Method: Circle System Utilized Preoxygenation: Pre-oxygenation with 100% oxygen Intubation Type: IV induction Ventilation: Mask ventilation without difficulty Laryngoscope Size: Miller and 3 Grade View: Grade II Tube type: Oral Number of attempts: 1 Airway Equipment and Method: Stylet and Oral airway Placement Confirmation: ETT inserted through vocal cords under direct vision,  positive ETCO2 and breath sounds checked- equal and bilateral Secured at: 23 cm Tube secured with: Tape Dental Injury: Teeth and Oropharynx as per pre-operative assessment       Right ISB image

## 2015-04-17 NOTE — Anesthesia Preprocedure Evaluation (Signed)
Anesthesia Evaluation  Patient identified by MRN, date of birth, ID band Patient awake    Reviewed: Allergy & Precautions, NPO status , Patient's Chart, lab work & pertinent test results  Airway Mallampati: II  TM Distance: >3 FB Neck ROM: Full    Dental  (+) Teeth Intact, Dental Advisory Given   Pulmonary    breath sounds clear to auscultation       Cardiovascular hypertension, Pt. on medications  Rhythm:Regular Rate:Normal     Neuro/Psych    GI/Hepatic   Endo/Other  Morbid obesity  Renal/GU      Musculoskeletal   Abdominal   Peds  Hematology   Anesthesia Other Findings   Reproductive/Obstetrics                             Anesthesia Physical Anesthesia Plan  ASA: III  Anesthesia Plan: General   Post-op Pain Management: MAC Combined w/ Regional for Post-op pain   Induction: Intravenous  Airway Management Planned: Oral ETT  Additional Equipment:   Intra-op Plan:   Post-operative Plan: Extubation in OR  Informed Consent: I have reviewed the patients History and Physical, chart, labs and discussed the procedure including the risks, benefits and alternatives for the proposed anesthesia with the patient or authorized representative who has indicated his/her understanding and acceptance.   Dental advisory given  Plan Discussed with: CRNA, Surgeon and Anesthesiologist  Anesthesia Plan Comments:         Anesthesia Quick Evaluation

## 2015-04-17 NOTE — Discharge Instructions (Signed)
Discharge Instructions after Arthroscopic Shoulder Surgery ° ° °A sling has been provided for you. You may remove the sling after 72 hours. The sling may be worn for your protection, if you are in a crowd.  °Use ice on the shoulder intermittently over the first 48 hours after surgery.  °Pain medication has been prescribed for you.  °Use your medication liberally over the first 48 hours, and then begin to taper your use. You may take Extra Strength Tylenol or Tylenol only in place of the pain pills. DO NOT take ANY nonsteroidal anti-inflammatory pain medications: Advil, Motrin, Ibuprofen, Aleve, Naproxen, or Naprosyn.  °You may remove your dressing after two days.  °You may shower 5 days after surgery. The incision CANNOT get wet prior to 5 days. Simply allow the water to wash over the site and then pat dry. Do not rub the incision. Make sure your axilla (armpit) is completely dry after showering.  °Take one aspirin a day for 2 weeks after surgery, unless you have an aspirin sensitivity/allergy or asthma.  °Three to 5 times each day you should perform assisted overhead reaching and external rotation (outward turning) exercises with the operative arm. Both exercises should be done with the non-operative arm used as the "therapist arm" while the operative arm remains relaxed. Ten of each exercise should be done three to five times each day. ° ° ° °Overhead reach is helping to lift your stiff arm up as high as it will go. To stretch your overhead reach, lie flat on your back, relax, and grasp the wrist of the tight shoulder with your opposite hand. Using the power in your opposite arm, bring the stiff arm up as far as it is comfortable. Start holding it for ten seconds and then work up to where you can hold it for a count of 30. Breathe slowly and deeply while the arm is moved. Repeat this stretch ten times, trying to help the arm up a little higher each time.  ° ° ° ° ° °External rotation is turning the arm out to  the side while your elbow stays close to your body. External rotation is best stretched while you are lying on your back. Hold a cane, yardstick, broom handle, or dowel in both hands. Bend both elbows to a right angle. Use steady, gentle force from your normal arm to rotate the hand of the stiff shoulder out away from your body. Continue the rotation as far as it will go comfortably, holding it there for a count of 10. Repeat this exercise ten times.  ° ° ° °Please call [phone] during normal business hours or [phone] after hours for any problems. Including the following: ° °- excessive redness of the incisions °- drainage for more than 4 days °- fever of more than 101.5 F ° °*Please note that pain medications will not be refilled after hours or on weekends. ° ° °Regional Anesthesia Blocks ° °1. Numbness or the inability to move the "blocked" extremity may last from 3-48 hours after placement. The length of time depends on the medication injected and your individual response to the medication. If the numbness is not going away after 48 hours, call your surgeon. ° °2. The extremity that is blocked will need to be protected until the numbness is gone and the  Strength has returned. Because you cannot feel it, you will need to take extra care to avoid injury. Because it may be weak, you may have difficulty moving it or using   it. You may not know what position it is in without looking at it while the block is in effect. ° °3. For blocks in the legs and feet, returning to weight bearing and walking needs to be done carefully. You will need to wait until the numbness is entirely gone and the strength has returned. You should be able to move your leg and foot normally before you try and bear weight or walk. You will need someone to be with you when you first try to ensure you do not fall and possibly risk injury. ° °4. Bruising and tenderness at the needle site are common side effects and will resolve in a few  days. ° °5. Persistent numbness or new problems with movement should be communicated to the surgeon or the [facility] ([phone])/ [facility] ([phone]). ° ° °Post Anesthesia Home Care Instructions ° °Activity: °Get plenty of rest for the remainder of the day. A responsible adult should stay with you for 24 hours following the procedure.  °For the next 24 hours, DO NOT: °-Drive a car °-Operate machinery °-Drink alcoholic beverages °-Take any medication unless instructed by your physician °-Make any legal decisions or sign important papers. ° °Meals: °Start with liquid foods such as gelatin or soup. Progress to regular foods as tolerated. Avoid greasy, spicy, heavy foods. If nausea and/or vomiting occur, drink only clear liquids until the nausea and/or vomiting subsides. Call your physician if vomiting continues. ° °Special Instructions/Symptoms: °Your throat may feel dry or sore from the anesthesia or the breathing tube placed in your throat during surgery. If this causes discomfort, gargle with warm salt water. The discomfort should disappear within 24 hours. ° °If you had a scopolamine patch placed behind your ear for the management of post- operative nausea and/or vomiting: ° °1. The medication in the patch is effective for 72 hours, after which it should be removed.  Wrap patch in a tissue and discard in the trash. Wash hands thoroughly with soap and water. °2. You may remove the patch earlier than 72 hours if you experience unpleasant side effects which may include dry mouth, dizziness or visual disturbances. °3. Avoid touching the patch. Wash your hands with soap and water after contact with the patch. °  ° °

## 2015-04-18 ENCOUNTER — Encounter (HOSPITAL_BASED_OUTPATIENT_CLINIC_OR_DEPARTMENT_OTHER): Payer: Self-pay | Admitting: Orthopedic Surgery

## 2015-11-07 NOTE — Progress Notes (Signed)
Cardiology Office Note   Date:  11/08/2015   ID:  Derek ConchJames Lenz, DOB 1960/09/17, MRN [id number]  PCP:  Pcp Not In System  Cardiologist:   Chilton Siiffany [place], MD   Chief Complaint  Patient presents with  . New Patient (Initial Visit)    lightheaded/dizziness; occasionally. edema; legs.      History of Present Illness: Derek [name] is a 55 y.o. male with hypertension and alcoholism who presents for an evaluation of bradycardia.  Mr. Landis GandyWilder was admitted to an alcohol addiction program 09/2015 and was noted to have bradycardia with rates in the 50s.  This was discovered on routine vital sign assessments and he was otherwise asymptomatic.  They also noted that the pulses in his right leg were diminished.   While at his rehabilitation program it was recommended that he not take doxazosin due to bradycardia.  It was recommended that he follow up with a cardiologist.  He saw Kathleene HazelKaitlin Stock, NP on 10/16/15 and was referred to our office.  He had an EKG at that appointment that revealed sinus bradycardia with a rate of 56 bpm.   Mr. Landis GandyWilder reports that he has been feeling well. He occasionally notes a flushing sensation that occurs over his head last for a few seconds at a time and has been several times during the day. It typically occurs when he is walking sometimes when he is sitting. He has not noted and when laying down. He denies any chest pain. He does have exertional shortness of breath that he attributes to being out of shape. He notes occasional lower extremity edema after drinking sodas. It improves with elevation. He denies orthopnea or PND.  Also denies claudication. He notes frequent palpitations that last for a few seconds at a time.  He denies syncope or presyncope.  Does not get much exercise due to a torn meniscus in his left knee and her recent rotator cuff surgery.   Past Medical History:  Diagnosis Date  . Depression   . Essential hypertension 11/08/2015  . Hypertension   . Obesity  (BMI 30.0-34.9) 11/08/2015  . Sinus bradycardia 11/08/2015    Past Surgical History:  Procedure Laterality Date  . CHOLECYSTECTOMY    . [facility]IC [facility]PASS     [facility]  . ROTATOR CUFF REPAIR  2017  . ROUX-EN-Y PROCEDURE  2007  . SHOULDER ARTHROSCOPY WITH SUBACROMIAL DECOMPRESSION Right 04/17/2015   Procedure: SHOULDER ARTHROSCOPY ROTATOR CUFF DEBRIDEMENT WITH SUBACROMIAL DECOMPRESSION;  Surgeon: Jones BroomJustin Chandler, MD;  Location: [facility];  Service: Orthopedics;  Laterality: Right;  . TONSILLECTOMY    . wisdom teeth extractions       Current Outpatient Prescriptions  Medication Sig Dispense Refill  . AMPHETAMINE SULFATE PO Take by mouth.    Marland Kitchen. buPROPion (WELLBUTRIN XL) 150 MG 24 hr tablet     . DOXAZOSIN MESYLATE PO Take 10 mg by mouth.    . escitalopram (LEXAPRO) 10 MG tablet     . valsartan (DIOVAN) 80 MG tablet Take 80 mg by mouth daily.     No current facility-administered medications for this visit.     Allergies:   Review of patient's allergies indicates no known allergies.    Social History:  The patient  reports that he has never smoked. He has never used smokeless tobacco. He reports that he does not drink alcohol or use drugs.   Family History:  The patient's family history includes Cancer in his mother; Heart attack in  his maternal grandfather; Peripheral Artery Disease in his father.    ROS:  Please see the history of present illness.   Otherwise, review of systems are positive for none.   All other systems are reviewed and negative.    PHYSICAL EXAM: VS:  BP (!) 181/94   Pulse 61   Ht 6\' 1"  (1.854 m)   Wt 263 lb 3.2 oz (119.4 kg)   BMI 34.73 kg/m  , BMI Body mass index is 34.73 kg/m. GENERAL:  Well appearing HEENT:  Pupils equal round and reactive, fundi not visualized, oral mucosa unremarkable NECK:  No jugular venous distention, waveform within normal limits, carotid upstroke brisk and symmetric, no bruits, no thyromegaly LYMPHATICS:   No cervical adenopathy LUNGS:  Clear to auscultation bilaterally HEART:  RRR.  PMI not displaced or sustained,S1 and S2 within normal limits, no S3, no S4, no clicks, no rubs, no murmurs ABD:  Flat, positive bowel sounds normal in frequency in pitch, no bruits, no rebound, no guarding, no midline pulsatile mass, no hepatomegaly, no splenomegaly EXT:  1+ DP/PT bilaterally, no edema, no cyanosis no clubbing SKIN:  No rashes no nodules NEURO:  Cranial nerves II through XII grossly intact, motor grossly intact throughout PSYCH:  Cognitively intact, oriented to person place and time   EKG:  EKG is ordered today. The ekg ordered today demonstrates sinus rhythm.  Rate 61 bpm.  Low voltage.     Recent Labs: 04/17/2015: BUN 7; Creatinine, Ser 1.00; Hemoglobin 15.6; Potassium 3.7; Sodium 143   02/11/15: Total cholesterol 201, triglycerides 100, HDL 55, LDL 126 AST 26, ALT 19  Lipid Panel No results found for: CHOL, TRIG, HDL, CHOLHDL, VLDL, LDLCALC, LDLDIRECT    Wt Readings from Last 3 Encounters:  11/08/15 263 lb 3.2 oz (119.4 kg)  04/17/15 272 lb 4 oz (123.5 kg)      ASSESSMENT AND PLAN:  # Bradycardia: Mr. Jefferys has asymptomatic bradycardia. It is unclear if these episodes of flushing are related to his bradycardia. It does seem as though his heart rate increases appropriately with exercise, as he notes that it was in the 70s when they checked it after he walked across campus. We will obtain a 48-hour Holter to better evaluate these flushing sensations and see if he is having transient episodes of more significant bradycardia.  # Hypertension:  Blood pressure is poorly-controlled. He has not been taking doxazosin since he was noted to have bradycardia. I have instructed him to resume this medication. He will follow-up in blood pressure clinic in 2 weeks.  He reports that it was previously well-controlled when taking  # Diminished pulses: Mr. Rodriguez has 1+ pedal pulses bilaterally.  However, he is asymptomatic and has no claudication with walking long distances. Therefore, no intervention is required at this time.  # Obesity: # CV Disease Prevention: Lipids recently checked with PCP.  Recommend increasing exercise to at least 30-40 minutes most days of the week. He expressed understanding.   Current medicines are reviewed at length with the patient today.  The patient does not have concerns regarding medicines.  The following changes have been made:  Resume doxazosin  Labs/ tests ordered today include:   Orders Placed This Encounter  Procedures  . Holter monitor - 48 hour  . EKG 12-Lead     Disposition:   FU with Avram Danielson [facility], MD, [facility] in 1 year.  BP clinic in 2 weeks.     This note was written with the assistance of  speech recognition software.  Please excuse any transcriptional errors.  Signed, Caeleigh Prohaska [facility]. [facility] Sal[facility], MD, [facility]  11/08/2015 9:40 AM    [facility]

## 2015-11-08 ENCOUNTER — Ambulatory Visit (INDEPENDENT_AMBULATORY_CARE_PROVIDER_SITE_OTHER): Payer: 59 | Admitting: Cardiovascular Disease

## 2015-11-08 ENCOUNTER — Encounter: Payer: Self-pay | Admitting: Cardiovascular Disease

## 2015-11-08 VITALS — BP 181/94 | HR 61 | Ht 73.0 in | Wt 263.2 lb

## 2015-11-08 DIAGNOSIS — E6609 Other obesity due to excess calories: Secondary | ICD-10-CM

## 2015-11-08 DIAGNOSIS — R001 Bradycardia, unspecified: Secondary | ICD-10-CM

## 2015-11-08 DIAGNOSIS — E669 Obesity, unspecified: Secondary | ICD-10-CM | POA: Diagnosis not present

## 2015-11-08 DIAGNOSIS — E66811 Obesity, class 1: Secondary | ICD-10-CM

## 2015-11-08 DIAGNOSIS — I10 Essential (primary) hypertension: Secondary | ICD-10-CM | POA: Diagnosis not present

## 2015-11-08 HISTORY — DX: Obesity, unspecified: E66.9

## 2015-11-08 HISTORY — DX: Obesity, class 1: E66.811

## 2015-11-08 HISTORY — DX: Bradycardia, unspecified: R00.1

## 2015-11-08 HISTORY — DX: Essential (primary) hypertension: I10

## 2015-11-08 NOTE — Patient Instructions (Signed)
Medication Instructions:  RESTART YOUR DOXAZOSIN   Labwork: NONE  Testing/Procedures: Your physician has recommended that you wear a holter monitor. Holter monitors are medical devices that record the heart's electrical activity. Doctors most often use these monitors to diagnose arrhythmias. Arrhythmias are problems with the speed or rhythm of the heartbeat. The monitor is a small, portable device. You can wear one while you do your normal daily activities. This is usually used to diagnose what is causing palpitations/syncope (passing out). 48 HOUR  Follow-Up: Your physician recommends that you schedule a follow-up appointment in: 2 WEEKS WITH PHARM D FOR BLOOD PRESSURE  Your physician wants you to follow-up in: 1 YEAR OV You will receive a reminder letter in the mail two months in advance. If you don't receive a letter, please call our office to schedule the follow-up appointment.  If you need a refill on your cardiac medications before your next appointment, please call your pharmacy.

## 2015-11-15 ENCOUNTER — Ambulatory Visit (INDEPENDENT_AMBULATORY_CARE_PROVIDER_SITE_OTHER): Payer: 59

## 2015-11-15 DIAGNOSIS — R001 Bradycardia, unspecified: Secondary | ICD-10-CM

## 2015-11-28 ENCOUNTER — Ambulatory Visit: Payer: 59

## 2015-11-28 NOTE — Progress Notes (Deleted)
Patient ID: Derek [name]                 DOB: [birth year]                      MRN: [id number]     HPI: Derek [name] is a 55 y.o. male referred by Dr. [facility] Salviaandolph to HTN clinic - last seen 11/08/2015. PMH significant for HTN, asymptomatic bradycardia, alcoholism, and obesity. Previously on doxazosin which was subsequently stopped by pt per recs from an alcohol treatment program in the setting of bradycardia. When seen by Dr. [facility] Salvia[facility] for evaluation of bradycardia, noted BP 181/94 at which time doxazosin restarted (pt reports BP controlled with medication). Plan for 48hr Holter monitor placement 11/15/2015 - results pending.   Dizziness headache fatigue? Sexual dys? Dry mout?  Current HTN meds: Valsartan 80mg  daily, doxazosin 10mg  daily Previously tried: None BP goal: <140/90 mmHg  Family History:   Social History:   Diet:   Exercise: Little exercise due to a torn meniscus in his left knee and her recent rotator cuff surgery.  Home BP readings:   Wt Readings from Last 3 Encounters:  11/08/15 263 lb 3.2 oz (119.4 kg)  04/17/15 272 lb 4 oz (123.5 kg)   BP Readings from Last 3 Encounters:  11/08/15 (!) 181/94  04/17/15 138/87   Pulse Readings from Last 3 Encounters:  11/08/15 61  04/17/15 73    Renal function: CrCl cannot be calculated (Unknown ideal weight.).  Past Medical History:  Diagnosis Date  . Depression   . Essential hypertension 11/08/2015  . Hypertension   . Obesity (BMI 30.0-34.9) 11/08/2015  . Sinus bradycardia 11/08/2015    Current Outpatient Prescriptions on File Prior to Visit  Medication Sig Dispense Refill  . AMPHETAMINE SULFATE PO Take by mouth.    Marland Kitchen. buPROPion (WELLBUTRIN XL) 150 MG 24 hr tablet     . DOXAZOSIN MESYLATE PO Take 10 mg by mouth.    . escitalopram (LEXAPRO) 10 MG tablet     . valsartan (DIOVAN) 80 MG tablet Take 80 mg by mouth daily.     No current facility-administered medications on file prior to visit.     No Known  Allergies   Assessment/Plan:

## 2016-01-02 ENCOUNTER — Telehealth: Payer: Self-pay | Admitting: *Deleted

## 2016-01-02 NOTE — Telephone Encounter (Signed)
-----   Message from Chilton Siiffany Acampo, MD sent at 12/12/2015  4:18 PM EST ----- Monitor shows occasional early heartbeats in the top and bottom chambers of the heart. This is not dangerous but can cause palpitations. Keep follow-up to discuss

## 2016-01-02 NOTE — Telephone Encounter (Signed)
Left message to call back  Patient no showed for follow up, needs to reschedule when he calls back

## 2016-01-25 NOTE — Telephone Encounter (Signed)
Left message to call back  

## 2016-02-08 ENCOUNTER — Encounter: Payer: Self-pay | Admitting: *Deleted

## 2016-02-08 NOTE — Telephone Encounter (Signed)
Notes Recorded by Burnell BlanksMelinda B Aslin Farinas on 02/08/2016 at 3:30 PM EST Patient never returned call after several messages being left. Mailed letter regarding results

## 2016-02-19 DIAGNOSIS — E119 Type 2 diabetes mellitus without complications: Secondary | ICD-10-CM | POA: Diagnosis not present

## 2016-02-19 DIAGNOSIS — S0990XA Unspecified injury of head, initial encounter: Secondary | ICD-10-CM | POA: Diagnosis not present

## 2016-03-18 DIAGNOSIS — G5602 Carpal tunnel syndrome, left upper limb: Secondary | ICD-10-CM | POA: Diagnosis not present

## 2016-03-18 DIAGNOSIS — M720 Palmar fascial fibromatosis [Dupuytren]: Secondary | ICD-10-CM | POA: Diagnosis not present

## 2016-03-18 DIAGNOSIS — G5622 Lesion of ulnar nerve, left upper limb: Secondary | ICD-10-CM | POA: Diagnosis not present

## 2016-04-06 DIAGNOSIS — R0789 Other chest pain: Secondary | ICD-10-CM | POA: Diagnosis not present

## 2016-04-06 DIAGNOSIS — R61 Generalized hyperhidrosis: Secondary | ICD-10-CM | POA: Diagnosis not present

## 2016-04-06 DIAGNOSIS — I10 Essential (primary) hypertension: Secondary | ICD-10-CM | POA: Diagnosis not present

## 2016-04-06 DIAGNOSIS — R0602 Shortness of breath: Secondary | ICD-10-CM | POA: Diagnosis not present

## 2016-04-06 DIAGNOSIS — R079 Chest pain, unspecified: Secondary | ICD-10-CM | POA: Diagnosis not present

## 2016-04-11 DIAGNOSIS — R0789 Other chest pain: Secondary | ICD-10-CM | POA: Diagnosis not present

## 2016-04-11 DIAGNOSIS — R079 Chest pain, unspecified: Secondary | ICD-10-CM | POA: Diagnosis not present

## 2016-04-11 DIAGNOSIS — R61 Generalized hyperhidrosis: Secondary | ICD-10-CM | POA: Diagnosis not present

## 2016-04-11 DIAGNOSIS — R0602 Shortness of breath: Secondary | ICD-10-CM | POA: Diagnosis not present

## 2016-04-12 DIAGNOSIS — R079 Chest pain, unspecified: Secondary | ICD-10-CM | POA: Diagnosis not present

## 2016-04-16 DIAGNOSIS — R079 Chest pain, unspecified: Secondary | ICD-10-CM | POA: Diagnosis not present

## 2016-04-24 DIAGNOSIS — Z79899 Other long term (current) drug therapy: Secondary | ICD-10-CM | POA: Diagnosis not present

## 2016-05-01 DIAGNOSIS — Z79899 Other long term (current) drug therapy: Secondary | ICD-10-CM | POA: Diagnosis not present

## 2016-05-14 DIAGNOSIS — Z79899 Other long term (current) drug therapy: Secondary | ICD-10-CM | POA: Diagnosis not present

## 2016-05-20 DIAGNOSIS — Z79899 Other long term (current) drug therapy: Secondary | ICD-10-CM | POA: Diagnosis not present

## 2016-05-31 DIAGNOSIS — Z79899 Other long term (current) drug therapy: Secondary | ICD-10-CM | POA: Diagnosis not present

## 2016-06-04 DIAGNOSIS — I10 Essential (primary) hypertension: Secondary | ICD-10-CM | POA: Diagnosis not present

## 2016-06-05 DIAGNOSIS — Z79899 Other long term (current) drug therapy: Secondary | ICD-10-CM | POA: Diagnosis not present

## 2016-06-12 DIAGNOSIS — Z79899 Other long term (current) drug therapy: Secondary | ICD-10-CM | POA: Diagnosis not present

## 2016-06-13 DIAGNOSIS — Z79899 Other long term (current) drug therapy: Secondary | ICD-10-CM | POA: Diagnosis not present

## 2016-06-20 DIAGNOSIS — I10 Essential (primary) hypertension: Secondary | ICD-10-CM | POA: Diagnosis not present

## 2016-06-26 ENCOUNTER — Emergency Department (HOSPITAL_COMMUNITY)
Admission: EM | Admit: 2016-06-26 | Discharge: 2016-06-26 | Disposition: A | Discharge status: Home / Self Care | Payer: 59 | Attending: Emergency Medicine | Admitting: Emergency Medicine

## 2016-06-26 ENCOUNTER — Encounter (HOSPITAL_COMMUNITY): Payer: Self-pay | Admitting: *Deleted

## 2016-06-26 DIAGNOSIS — R55 Syncope and collapse: Secondary | ICD-10-CM | POA: Insufficient documentation

## 2016-06-26 DIAGNOSIS — T50905A Adverse effect of unspecified drugs, medicaments and biological substances, initial encounter: Secondary | ICD-10-CM | POA: Diagnosis not present

## 2016-06-26 DIAGNOSIS — Y829 Unspecified medical devices associated with adverse incidents: Secondary | ICD-10-CM | POA: Insufficient documentation

## 2016-06-26 DIAGNOSIS — I10 Essential (primary) hypertension: Secondary | ICD-10-CM | POA: Diagnosis not present

## 2016-06-26 DIAGNOSIS — T887XXA Unspecified adverse effect of drug or medicament, initial encounter: Secondary | ICD-10-CM | POA: Insufficient documentation

## 2016-06-26 DIAGNOSIS — T675XXA Heat exhaustion, unspecified, initial encounter: Secondary | ICD-10-CM | POA: Diagnosis not present

## 2016-06-26 DIAGNOSIS — Z79899 Other long term (current) drug therapy: Secondary | ICD-10-CM | POA: Diagnosis not present

## 2016-06-26 DIAGNOSIS — I952 Hypotension due to drugs: Secondary | ICD-10-CM | POA: Insufficient documentation

## 2016-06-26 DIAGNOSIS — R079 Chest pain, unspecified: Secondary | ICD-10-CM | POA: Diagnosis not present

## 2016-06-26 DIAGNOSIS — E860 Dehydration: Secondary | ICD-10-CM | POA: Diagnosis not present

## 2016-06-26 DIAGNOSIS — R61 Generalized hyperhidrosis: Secondary | ICD-10-CM | POA: Diagnosis not present

## 2016-06-26 HISTORY — DX: Alcohol abuse, uncomplicated: F10.10

## 2016-06-26 LAB — COMPREHENSIVE METABOLIC PANEL
ALBUMIN: 4.1 g/dL (ref 3.5–5.0)
ALK PHOS: 99 U/L (ref 38–126)
ALT: 22 U/L (ref 17–63)
AST: 36 U/L (ref 15–41)
Anion gap: 12 (ref 5–15)
BILIRUBIN TOTAL: 1.2 mg/dL (ref 0.3–1.2)
BUN: 13 mg/dL (ref 6–20)
CALCIUM: 9.2 mg/dL (ref 8.9–10.3)
CO2: 20 mmol/L — ABNORMAL LOW (ref 22–32)
Chloride: 106 mmol/L (ref 101–111)
Creatinine, Ser: 1.6 mg/dL — ABNORMAL HIGH (ref 0.61–1.24)
GFR calc Af Amer: 54 mL/min — ABNORMAL LOW (ref >60)
GFR calc non Af Amer: 46 mL/min — ABNORMAL LOW (ref >60)
Glucose, Bld: 103 mg/dL — ABNORMAL HIGH (ref 65–99)
Potassium: 4.2 mmol/L (ref 3.5–5.1)
Sodium: 138 mmol/L (ref 135–145)
TOTAL PROTEIN: 6.7 g/dL (ref 6.5–8.1)

## 2016-06-26 LAB — CBC WITH DIFFERENTIAL/PLATELET
BASOS ABS: 0 10*3/uL (ref 0.0–0.1)
BASOS PCT: 0 %
Eosinophils Absolute: 0.1 10*3/uL (ref 0.0–0.7)
Eosinophils Relative: 1 %
HEMATOCRIT: 42.2 % (ref 39.0–52.0)
HEMOGLOBIN: 14.2 g/dL (ref 13.0–17.0)
Lymphocytes Relative: 21 %
Lymphs Abs: 1.6 10*3/uL (ref 0.7–4.0)
MCH: 28 pg (ref 26.0–34.0)
MCHC: 33.6 g/dL (ref 30.0–36.0)
MCV: 83.1 fL (ref 78.0–100.0)
Monocytes Absolute: 0.5 10*3/uL (ref 0.1–1.0)
Monocytes Relative: 7 %
NEUTROS ABS: 5.6 10*3/uL (ref 1.7–7.7)
NEUTROS PCT: 71 %
Platelets: 222 10*3/uL (ref 150–400)
RBC: 5.08 MIL/uL (ref 4.22–5.81)
RDW: 14.9 % (ref 11.5–15.5)
WBC: 7.8 10*3/uL (ref 4.0–10.5)

## 2016-06-26 LAB — CBG MONITORING, ED: GLUCOSE-CAPILLARY: 100 mg/dL — AB (ref 65–99)

## 2016-06-26 LAB — TROPONIN I

## 2016-06-26 MED ORDER — SODIUM CHLORIDE 0.9 % IV BOLUS (SEPSIS)
1000.0000 mL | Freq: Once | INTRAVENOUS | Status: AC
  Administered 2016-06-26: 1000 mL via INTRAVENOUS

## 2016-06-26 MED ORDER — ACETAMINOPHEN 325 MG PO TABS
650.0000 mg | ORAL_TABLET | Freq: Four times a day (QID) | ORAL | Status: DC | PRN
  Administered 2016-06-26: 650 mg via ORAL
  Filled 2016-06-26: qty 2

## 2016-06-26 NOTE — ED Notes (Signed)
Pt complaining of headache and asking for tylenol. PA-C notified. Will await further orders.

## 2016-06-26 NOTE — ED Notes (Signed)
Took patient vitals did ekg shown to Dr Estell HarpinZammit

## 2016-06-26 NOTE — ED Triage Notes (Addendum)
[facility] after possible syncopal  Episode while working outside.  PT was dizzy this am, and dizziness increased when he climbed up latter (later realized he had taken double dose of bp meds). Staff at office stated pt was diaphoretic, flushed and disoriented.  When EMS arrived pt was dry, but flushed.AO x 4, but will occ state there is a bug on his face when there isn't (been sober x 1 week).  ekg unremarkable, 129/84, hr 59, 99% RA, rr 18.  Initial rr were 30  And pt was c/o tingling to face and feet bil.  CBG 126.

## 2016-06-26 NOTE — Discharge Instructions (Signed)
Return if any problems.  Take our Medication as directed.  Avoid becoming overheated

## 2016-06-26 NOTE — ED Notes (Signed)
Pt verbalized understanding of discharge instructions.

## 2016-06-26 NOTE — ED Provider Notes (Signed)
[facility]-EMERGENCY DEPT Provider Note   CSN: [id number] Arrival date & time: 06/26/16  1350     History   Chief Complaint Chief Complaint  Patient presents with  . Loss of Consciousness    HPI Derek [name] is a 56 y.o. male.  The history is provided by the patient. No language interpreter was used.  Loss of Consciousness   This is a new problem. The current episode started 6 to 12 hours ago. The problem occurs constantly. The problem has been gradually worsening. There was no loss of consciousness. The problem is associated with normal activity. Pertinent negatives include visual change. He has tried nothing for the symptoms. The treatment provided no relief. His past medical history is significant for HTN.  Pt reports he took an extra dosage of his blood pressure medication this morning by accident.  Pt reports he was working outside in the heat and became very hot and sweaty.   Pt states he felt like he was going to pass out.   Past Medical History:  Diagnosis Date  . Alcohol abuse   . Depression   . Essential hypertension 11/08/2015  . Hypertension   . Obesity (BMI 30.0-34.9) 11/08/2015  . Sinus bradycardia 11/08/2015    Patient Active Problem List   Diagnosis Date Noted  . Essential hypertension 11/08/2015  . Obesity (BMI 30.0-34.9) 11/08/2015  . Sinus bradycardia 11/08/2015    Past Surgical History:  Procedure Laterality Date  . CHOLECYSTECTOMY    . [facility]PASS     [facility]  . ROTATOR CUFF REPAIR  2017  . ROUX-EN-Y PROCEDURE  2007  . SHOULDER ARTHROSCOPY WITH SUBACROMIAL DECOMPRESSION Right 04/17/2015   Procedure: SHOULDER ARTHROSCOPY ROTATOR CUFF DEBRIDEMENT WITH SUBACROMIAL DECOMPRESSION;  Surgeon: Jones BroomJustin Chandler, MD;  Location: [facility];  Service: Orthopedics;  Laterality: Right;  . TONSILLECTOMY    . wisdom teeth extractions         Home Medications    Prior to Admission medications   Medication Sig Start Date End Date  Taking? Authorizing Provider  acamprosate (CAMPRAL) 333 MG tablet Take 666 mg by mouth 3 (three) times daily. 06/16/16  Yes [provider]  buPROPion (WELLBUTRIN SR) 150 MG 12 hr tablet Take 150 mg by mouth daily.   Yes [provider]  Cyanocobalamin (B-12) 2500 MCG SUBL Place 2,500 mcg under the tongue daily.   Yes [provider]  FLUoxetine (PROZAC) 20 MG capsule Take 20 mg by mouth daily. 06/24/16  Yes [provider]  naltrexone (DEPADE) 50 MG tablet Take 50 mg by mouth daily. 06/24/16  Yes [provider]  valsartan (DIOVAN) 80 MG tablet Take 40 mg by mouth daily.    Yes [provider]  Vitamin D, Ergocalciferol, (DRISDOL) 50000 units CAPS capsule Take 50,000 Units by mouth every Sunday.   Yes [provider]  DOXAZOSIN MESYLATE PO Take 10 mg by mouth.    [provider]    Family History Family History  Problem Relation Age of Onset  . Cancer Mother   . Peripheral Artery Disease Father   . Heart attack Maternal Grandfather     Social History Social History  Substance Use Topics  . Smoking status: Never Smoker  . Smokeless tobacco: Never Used  . Alcohol use No     Comment: history of alcoholism     Allergies   Patient has no known allergies.   Review of Systems Review of Systems  Cardiovascular: Positive for  syncope.  All other systems reviewed and are negative.    Physical Exam Updated Vital Signs BP (!) 152/87   Pulse 61   Temp 98.4 F (36.9 C)   Resp 20   SpO2 96%   Physical Exam  Constitutional: He appears well-developed and well-nourished.  HENT:  Head: Normocephalic and atraumatic.  Eyes: Conjunctivae are normal.  Neck: Neck supple.  Cardiovascular: Normal rate and regular rhythm.   No murmur heard. Pulmonary/Chest: Effort normal and breath sounds normal. No respiratory distress.  Abdominal: Soft. There is no tenderness.  Musculoskeletal: Normal range of motion. He exhibits  no edema.  Neurological: He is alert. A cranial nerve deficit is present.  Skin: Skin is warm and dry.  Psychiatric: He has a normal mood and affect.  Nursing note and vitals reviewed.    ED Treatments / Results  Labs (all labs ordered are listed, but only abnormal results are displayed) Labs Reviewed  COMPREHENSIVE METABOLIC PANEL - Abnormal; Notable for the following:       Result Value   CO2 20 (*)    Glucose, Bld 103 (*)    Creatinine, Ser 1.60 (*)    GFR calc non Af Amer 46 (*)    GFR calc Af Amer 54 (*)    All other components within normal limits  CBG MONITORING, ED - Abnormal; Notable for the following:    Glucose-Capillary 100 (*)    All other components within normal limits  CBC WITH DIFFERENTIAL/PLATELET  TROPONIN I    EKG  EKG Interpretation None       Radiology No results found.  Procedures Procedures (including critical care time)  Medications Ordered in ED Medications  sodium chloride 0.9 % bolus 1,000 mL (not administered)  acetaminophen (TYLENOL) tablet 650 mg (not administered)  sodium chloride 0.9 % bolus 1,000 mL (1,000 mLs Intravenous New Bag/Given 06/26/16 1451)     Initial Impression / Assessment and Plan / ED Course  I have reviewed the triage vital signs and the nursing notes.  Pertinent labs & imaging results that were available during my care of the patient were reviewed by me and considered in my medical decision making (see chart for details).     Pt given Iv fluids x 2 liters.  Pt's blood pressure has stable in ED.  Pt counseled on heat exposure and need to take medication as directed.   Final Clinical Impressions(s) / ED Diagnoses   Final diagnoses:  Near syncope  Heat exhaustion, initial encounter  Hypotension due to drugs   Pt feels better after IV fluids,  No numbness no weakness.  New Prescriptions New Prescriptions   No medications on file  An After Visit Summary was printed and given to the patient.   Elson Areas, PA-C 06/26/16 1709    Elson Areas, PA-C 06/26/16 Maurine Cane, MD 06/28/16 [phone]

## 2016-06-27 DIAGNOSIS — Z79899 Other long term (current) drug therapy: Secondary | ICD-10-CM | POA: Diagnosis not present

## 2016-07-11 DIAGNOSIS — Z79899 Other long term (current) drug therapy: Secondary | ICD-10-CM | POA: Diagnosis not present

## 2016-09-05 DIAGNOSIS — L237 Allergic contact dermatitis due to plants, except food: Secondary | ICD-10-CM | POA: Diagnosis not present

## 2016-10-20 DIAGNOSIS — L03113 Cellulitis of right upper limb: Secondary | ICD-10-CM | POA: Diagnosis not present

## 2016-10-21 DIAGNOSIS — L03113 Cellulitis of right upper limb: Secondary | ICD-10-CM | POA: Diagnosis not present

## 2016-12-14 ENCOUNTER — Encounter (HOSPITAL_COMMUNITY): Payer: Self-pay

## 2016-12-14 DIAGNOSIS — Z23 Encounter for immunization: Secondary | ICD-10-CM | POA: Insufficient documentation

## 2016-12-14 DIAGNOSIS — Y999 Unspecified external cause status: Secondary | ICD-10-CM | POA: Diagnosis not present

## 2016-12-14 DIAGNOSIS — Y939 Activity, unspecified: Secondary | ICD-10-CM | POA: Diagnosis not present

## 2016-12-14 DIAGNOSIS — R7989 Other specified abnormal findings of blood chemistry: Secondary | ICD-10-CM | POA: Diagnosis not present

## 2016-12-14 DIAGNOSIS — W19XXXA Unspecified fall, initial encounter: Secondary | ICD-10-CM | POA: Insufficient documentation

## 2016-12-14 DIAGNOSIS — R42 Dizziness and giddiness: Secondary | ICD-10-CM | POA: Insufficient documentation

## 2016-12-14 DIAGNOSIS — S0990XA Unspecified injury of head, initial encounter: Secondary | ICD-10-CM | POA: Diagnosis not present

## 2016-12-14 DIAGNOSIS — R791 Abnormal coagulation profile: Secondary | ICD-10-CM | POA: Diagnosis not present

## 2016-12-14 DIAGNOSIS — I10 Essential (primary) hypertension: Secondary | ICD-10-CM | POA: Diagnosis not present

## 2016-12-14 DIAGNOSIS — Y929 Unspecified place or not applicable: Secondary | ICD-10-CM | POA: Insufficient documentation

## 2016-12-14 DIAGNOSIS — F1010 Alcohol abuse, uncomplicated: Secondary | ICD-10-CM | POA: Insufficient documentation

## 2016-12-14 DIAGNOSIS — R0789 Other chest pain: Secondary | ICD-10-CM | POA: Diagnosis not present

## 2016-12-14 DIAGNOSIS — R079 Chest pain, unspecified: Secondary | ICD-10-CM | POA: Insufficient documentation

## 2016-12-14 DIAGNOSIS — S299XXA Unspecified injury of thorax, initial encounter: Secondary | ICD-10-CM | POA: Diagnosis not present

## 2016-12-14 DIAGNOSIS — S199XXA Unspecified injury of neck, initial encounter: Secondary | ICD-10-CM | POA: Diagnosis not present

## 2016-12-14 DIAGNOSIS — Z79899 Other long term (current) drug therapy: Secondary | ICD-10-CM | POA: Insufficient documentation

## 2016-12-14 NOTE — ED Triage Notes (Addendum)
States drinking problem and drank more today than he usually drinks with chest pain voiced with nausea voiced. Last drink about 1600 pm today.

## 2016-12-15 ENCOUNTER — Other Ambulatory Visit: Payer: Self-pay

## 2016-12-15 ENCOUNTER — Emergency Department (HOSPITAL_COMMUNITY)
Admission: EM | Admit: 2016-12-15 | Discharge: 2016-12-15 | Disposition: A | Discharge status: Home / Self Care | Payer: 59 | Attending: Emergency Medicine | Admitting: Emergency Medicine

## 2016-12-15 ENCOUNTER — Encounter (HOSPITAL_COMMUNITY): Payer: Self-pay | Admitting: Radiology

## 2016-12-15 ENCOUNTER — Emergency Department (HOSPITAL_COMMUNITY): Payer: 59

## 2016-12-15 DIAGNOSIS — R079 Chest pain, unspecified: Secondary | ICD-10-CM | POA: Diagnosis not present

## 2016-12-15 DIAGNOSIS — S0990XA Unspecified injury of head, initial encounter: Secondary | ICD-10-CM | POA: Diagnosis not present

## 2016-12-15 DIAGNOSIS — S199XXA Unspecified injury of neck, initial encounter: Secondary | ICD-10-CM | POA: Diagnosis not present

## 2016-12-15 DIAGNOSIS — R7989 Other specified abnormal findings of blood chemistry: Secondary | ICD-10-CM | POA: Diagnosis not present

## 2016-12-15 DIAGNOSIS — W19XXXA Unspecified fall, initial encounter: Secondary | ICD-10-CM

## 2016-12-15 DIAGNOSIS — F1010 Alcohol abuse, uncomplicated: Secondary | ICD-10-CM

## 2016-12-15 DIAGNOSIS — S299XXA Unspecified injury of thorax, initial encounter: Secondary | ICD-10-CM | POA: Diagnosis not present

## 2016-12-15 LAB — I-STAT TROPONIN, ED
TROPONIN I, POC: 0 ng/mL (ref 0.00–0.08)
Troponin i, poc: 0 ng/mL (ref 0.00–0.08)

## 2016-12-15 LAB — CBC WITH DIFFERENTIAL/PLATELET
Basophils Absolute: 0.1 10*3/uL (ref 0.0–0.1)
Basophils Relative: 1 %
EOS ABS: 0.1 10*3/uL (ref 0.0–0.7)
Eosinophils Relative: 2 %
HCT: 39 % (ref 39.0–52.0)
Hemoglobin: 13.5 g/dL (ref 13.0–17.0)
Lymphocytes Relative: 29 %
Lymphs Abs: 2.1 10*3/uL (ref 0.7–4.0)
MCH: 29 pg (ref 26.0–34.0)
MCHC: 34.6 g/dL (ref 30.0–36.0)
MCV: 83.7 fL (ref 78.0–100.0)
Monocytes Absolute: 0.8 10*3/uL (ref 0.1–1.0)
Monocytes Relative: 11 %
NEUTROS ABS: 4.1 10*3/uL (ref 1.7–7.7)
Neutrophils Relative %: 57 %
PLATELETS: 257 10*3/uL (ref 150–400)
RBC: 4.66 MIL/uL (ref 4.22–5.81)
RDW: 13.2 % (ref 11.5–15.5)
WBC: 7.2 10*3/uL (ref 4.0–10.5)

## 2016-12-15 LAB — URINALYSIS, ROUTINE W REFLEX MICROSCOPIC
Bilirubin Urine: NEGATIVE
Glucose, UA: 50 mg/dL — AB
Hgb urine dipstick: NEGATIVE
KETONES UR: 5 mg/dL — AB
LEUKOCYTES UA: NEGATIVE
NITRITE: NEGATIVE
PH: 5 (ref 5.0–8.0)
PROTEIN: NEGATIVE mg/dL
Specific Gravity, Urine: 1.018 (ref 1.005–1.030)

## 2016-12-15 LAB — LIPASE, BLOOD: LIPASE: 32 U/L (ref 11–51)

## 2016-12-15 LAB — COMPREHENSIVE METABOLIC PANEL
ALT: 30 U/L (ref 17–63)
ANION GAP: 18 — AB (ref 5–15)
AST: 46 U/L — ABNORMAL HIGH (ref 15–41)
Albumin: 3.6 g/dL (ref 3.5–5.0)
Alkaline Phosphatase: 110 U/L (ref 38–126)
BUN: 14 mg/dL (ref 6–20)
CHLORIDE: 102 mmol/L (ref 101–111)
CO2: 18 mmol/L — AB (ref 22–32)
Calcium: 8.9 mg/dL (ref 8.9–10.3)
Creatinine, Ser: 0.99 mg/dL (ref 0.61–1.24)
Glucose, Bld: 169 mg/dL — ABNORMAL HIGH (ref 65–99)
POTASSIUM: 3.7 mmol/L (ref 3.5–5.1)
SODIUM: 138 mmol/L (ref 135–145)
Total Bilirubin: 0.6 mg/dL (ref 0.3–1.2)
Total Protein: 6.5 g/dL (ref 6.5–8.1)

## 2016-12-15 LAB — RAPID URINE DRUG SCREEN, HOSP PERFORMED
Amphetamines: NOT DETECTED
Barbiturates: NOT DETECTED
Benzodiazepines: NOT DETECTED
COCAINE: NOT DETECTED
OPIATES: NOT DETECTED
TETRAHYDROCANNABINOL: NOT DETECTED

## 2016-12-15 LAB — ETHANOL: Alcohol, Ethyl (B): 125 mg/dL — ABNORMAL HIGH (ref <10)

## 2016-12-15 LAB — D-DIMER, QUANTITATIVE: D-Dimer, Quant: 3.48 ug/mL-FEU — ABNORMAL HIGH (ref 0.00–0.50)

## 2016-12-15 MED ORDER — VITAMIN B-1 100 MG PO TABS: 100.0000 mg | ORAL_TABLET | Freq: Every day | ORAL | Status: DC

## 2016-12-15 MED ORDER — LORAZEPAM 2 MG/ML IJ SOLN
0.0000 mg | Freq: Four times a day (QID) | INTRAMUSCULAR | Status: DC
  Administered 2016-12-15: 2 mg via INTRAVENOUS
  Filled 2016-12-15: qty 1

## 2016-12-15 MED ORDER — LORAZEPAM 2 MG/ML IJ SOLN: 0.0000 mg | Freq: Two times a day (BID) | INTRAMUSCULAR | Status: DC

## 2016-12-15 MED ORDER — IOPAMIDOL (ISOVUE-370) INJECTION 76%
100.0000 mL | Freq: Once | INTRAVENOUS | Status: AC | PRN
  Administered 2016-12-15: 100 mL via INTRAVENOUS

## 2016-12-15 MED ORDER — SODIUM CHLORIDE 0.9 % IV BOLUS (SEPSIS)
500.0000 mL | Freq: Once | INTRAVENOUS | Status: AC
  Administered 2016-12-15: 500 mL via INTRAVENOUS

## 2016-12-15 MED ORDER — THIAMINE HCL 100 MG/ML IJ SOLN: 100.0000 mg | Freq: Every day | INTRAMUSCULAR | Status: DC

## 2016-12-15 MED ORDER — LORAZEPAM 1 MG PO TABS: 0.0000 mg | ORAL_TABLET | Freq: Four times a day (QID) | ORAL | Status: DC

## 2016-12-15 MED ORDER — TETANUS-DIPHTH-ACELL PERTUSSIS 5-2.5-18.5 LF-MCG/0.5 IM SUSP
0.5000 mL | Freq: Once | INTRAMUSCULAR | Status: AC
  Administered 2016-12-15: 0.5 mL via INTRAMUSCULAR
  Filled 2016-12-15: qty 0.5

## 2016-12-15 MED ORDER — FAMOTIDINE 20 MG PO TABS
20.0000 mg | ORAL_TABLET | Freq: Once | ORAL | Status: AC
  Administered 2016-12-15: 20 mg via ORAL
  Filled 2016-12-15: qty 1

## 2016-12-15 MED ORDER — IOPAMIDOL (ISOVUE-370) INJECTION 76%
INTRAVENOUS | Status: AC
  Filled 2016-12-15: qty 100

## 2016-12-15 MED ORDER — LORAZEPAM 1 MG PO TABS: 0.0000 mg | ORAL_TABLET | Freq: Two times a day (BID) | ORAL | Status: DC

## 2016-12-15 NOTE — ED Notes (Signed)
Pt called from lobby with no response. 

## 2016-12-15 NOTE — Discharge Instructions (Signed)
1. Medications: usual home medications 2. Treatment: rest, drink plenty of fluids, eat a balanced diet and ensure that you are getting enough calories 3. Follow Up: Please followup with your primary doctor in 2-3 days for discussion of your diagnoses and further evaluation after today's visit; if you do not have a primary care doctor use the resource guide provided to find one; Please return to the ER for return of CP, SOB or development of abd pain, N/V/D, weakness or other concerns

## 2016-12-15 NOTE — ED Notes (Signed)
Bed: [id number] Expected date:  Expected time:  Means of arrival:  Comments: [facility]

## 2016-12-15 NOTE — ED Provider Notes (Signed)
[facility]-EMERGENCY DEPT Provider Note   CSN: [id number] Arrival date & time: 12/14/16  2053     History   Chief Complaint Chief Complaint  Patient presents with  . Alcohol Problem    chest pain    HPI Derek [name] is a 56 y.o. male with a hx of depression, alcohol abuse, hypertension, obesity, gastric bypass (2007) presents to the Emergency Department complaining of gradual, persistent, progressively worsening SOB and palpitations onset earlier in the day. Associated symptoms include central chest pain that persists.  He reports pain has been steady, but is not worsened with exertion.  Pt reports drinking approx 1/5 of liquor tonight.  Pt reports he is a binge drinker and not a daily drinker.  Pt denies smoking.  Pt reports getting lightheaded in the waiting room. Pt reports he fell earlier but cannot remember the incident.  Unknown syncope.  Pt denies feeling like this in the past.  Nothing makes it better and nothing makes it worse.  Pt denies fever, chills, headache, neck pain, cough, congestion, abdominal pain, nausea, vomiting, diarrhea, weakness, dizziness, syncope.  Pt also denies recent illness, cough or congestion.   The history is provided by the patient and medical records. No language interpreter was used.    Past Medical History:  Diagnosis Date  . Alcohol abuse   . Depression   . Essential hypertension 11/08/2015  . Hypertension   . Obesity (BMI 30.0-34.9) 11/08/2015  . Sinus bradycardia 11/08/2015    Patient Active Problem List   Diagnosis Date Noted  . Essential hypertension 11/08/2015  . Obesity (BMI 30.0-34.9) 11/08/2015  . Sinus bradycardia 11/08/2015    Past Surgical History:  Procedure Laterality Date  . CHOLECYSTECTOMY    . GASTRIC BYPASS     2006 - [facility]  . ROTATOR CUFF REPAIR  2017  . ROUX-EN-Y PROCEDURE  2007  . TONSILLECTOMY    . wisdom teeth extractions         Home Medications    Prior to Admission  medications   Medication Sig Start Date End Date Taking? Authorizing Provider  acamprosate (CAMPRAL) 333 MG tablet Take 666 mg by mouth 3 (three) times daily. 06/16/16  Yes [provider]  amphetamine-dextroamphetamine (ADDERALL) 20 MG tablet Take 20 mg 3 (three) times daily by mouth. 11/22/16  Yes [provider]  buPROPion (WELLBUTRIN SR) 150 MG 12 hr tablet Take 150 mg by mouth daily.   Yes [provider]  Cyanocobalamin (B-12) 2500 MCG SUBL Place 2,500 mcg under the tongue daily.   Yes [provider]  FLUoxetine (PROZAC) 20 MG capsule Take 20 mg by mouth daily. 06/24/16  Yes [provider]  losartan (COZAAR) 25 MG tablet Take 25 mg daily by mouth. 11/12/16  Yes [provider]  Vitamin D, Ergocalciferol, (DRISDOL) 50000 units CAPS capsule Take 50,000 Units by mouth every Sunday.   Yes [provider]    Family History Family History  Problem Relation Age of Onset  . Cancer Mother   . Peripheral Artery Disease Father   . Heart attack Maternal Grandfather     Social History Social History   Tobacco Use  . Smoking status: Never Smoker  . Smokeless tobacco: Never Used  Substance Use Topics  . Alcohol use: No    Comment: history of alcoholism  . Drug use: No     Allergies   Patient has no known allergies.   Review of Systems Review of Systems  Constitutional:  Negative for appetite change, diaphoresis, fatigue, fever and unexpected weight change.  HENT: Negative for mouth sores.   Eyes: Negative for visual disturbance.  Respiratory: Positive for chest tightness and shortness of breath. Negative for cough and wheezing.   Cardiovascular: Positive for chest pain.  Gastrointestinal: Negative for abdominal pain, constipation, diarrhea, nausea and vomiting.  Endocrine: Negative for polydipsia, polyphagia and polyuria.  Genitourinary: Negative for dysuria, frequency, hematuria and urgency.  Musculoskeletal: Negative  for back pain and neck stiffness.  Skin: Negative for rash.  Allergic/Immunologic: Negative for immunocompromised state.  Neurological: Positive for light-headedness. Negative for syncope and headaches.  Hematological: Does not bruise/bleed easily.  Psychiatric/Behavioral: Negative for sleep disturbance. The patient is not nervous/anxious.      Physical Exam Updated Vital Signs BP (!) 145/86   Pulse 81   Temp 98.4 F (36.9 C) (Oral)   Resp 18   Ht 6\' 1"  (1.854 m)   Wt 119.3 kg (263 lb)   SpO2 95%   BMI 34.70 kg/m   Physical Exam  Constitutional: He appears well-developed and well-nourished. No distress.  Awake, alert, nontoxic appearance  HENT:  Head: Normocephalic and atraumatic.  Mouth/Throat: Oropharynx is clear and moist. No oropharyngeal exudate.  Eyes: Conjunctivae are normal. No scleral icterus.  Neck: Normal range of motion. Neck supple.  Cardiovascular: Normal rate, regular rhythm and intact distal pulses.  Pulmonary/Chest: Effort normal and breath sounds normal. Tachypnea noted. No respiratory distress. He has no wheezes.  Equal chest expansion  Abdominal: Soft. Bowel sounds are normal. He exhibits no mass. There is no tenderness. There is no rebound and no guarding.  Musculoskeletal: Normal range of motion. He exhibits no edema.  No calf tenderness No peripheral edema  Neurological: He is alert.  Speech is clear and goal oriented Moves extremities without ataxia Strength 5/5 in the BUE and BLE  Sensation intact to normal touch in all extremities  Skin: Skin is warm and dry. He is not diaphoretic.  Abrasions to the right inner thigh  Psychiatric: His mood appears anxious.  Nursing note and vitals reviewed.    ED Treatments / Results  Labs (all labs ordered are listed, but only abnormal results are displayed) Labs Reviewed  COMPREHENSIVE METABOLIC PANEL - Abnormal; Notable for the following components:      Result Value   CO2 18 (*)    Glucose, Bld 169  (*)    AST 46 (*)    Anion gap 18 (*)    All other components within normal limits  ETHANOL - Abnormal; Notable for the following components:   Alcohol, Ethyl (B) 125 (*)    All other components within normal limits  URINALYSIS, ROUTINE W REFLEX MICROSCOPIC - Abnormal; Notable for the following components:   Glucose, UA 50 (*)    Ketones, ur 5 (*)    All other components within normal limits  D-DIMER, QUANTITATIVE (NOT AT [facility]) - Abnormal; Notable for the following components:   D-Dimer, Quant 3.48 (*)    All other components within normal limits  CBC WITH DIFFERENTIAL/PLATELET  LIPASE, BLOOD  RAPID URINE DRUG SCREEN, HOSP PERFORMED  I-STAT TROPONIN, ED  I-STAT TROPONIN, ED    EKG  EKG Interpretation  Date/Time:  Sunday December 15 2016 01:56:13 EST Ventricular Rate:  74 PR Interval:    QRS Duration: 90 QT Interval:  389 QTC Calculation: 432 R Axis:   -61 Text Interpretation:  Sinus or ectopic atrial rhythm Left anterior fascicular block Abnormal R-wave progression, late transition Confirmed  by Ross Marcus ([id number]) on 12/15/2016 1:58:27 AM        Radiology Dg Chest 2 View  Result Date: 12/15/2016 CLINICAL DATA:  56 y/o M; heart fluttering, chest pain, shortness of breath, lightheadedness. EXAM: CHEST  2 VIEW COMPARISON:  None. FINDINGS: The heart size and mediastinal contours are within normal limits. Both lungs are clear. The visualized skeletal structures are unremarkable. Cholecystectomy and gastric bypass upper abdomen surgical clips. IMPRESSION: No active cardiopulmonary disease. Electronically Signed   By: Mitzi Hansen M.D.   On: 12/15/2016 01:33   Ct Head Wo Contrast  Result Date: 12/15/2016 CLINICAL DATA:  Status post fall. Hit head, with concern for head or cervical spine injury. Initial encounter. EXAM: CT HEAD WITHOUT CONTRAST CT CERVICAL SPINE WITHOUT CONTRAST TECHNIQUE: Multidetector CT imaging of the head and cervical spine was performed  following the standard protocol without intravenous contrast. Multiplanar CT image reconstructions of the cervical spine were also generated. COMPARISON:  None. FINDINGS: CT HEAD FINDINGS Brain: No evidence of acute infarction, hemorrhage, hydrocephalus, extra-axial collection or mass lesion/mass effect. Prominence of the ventricles and sulci reflects mild cortical volume loss. Mild subcortical white matter change likely reflects small vessel ischemic microangiopathy. The posterior fossa, including the cerebellum, brainstem and fourth ventricle, is within normal limits. The third and lateral ventricles, and basal ganglia are unremarkable in appearance. The cerebral hemispheres are symmetric in appearance, with normal gray-white differentiation. No mass effect or midline shift is seen. Vascular: No hyperdense vessel or unexpected calcification. Skull: There is no evidence of fracture; visualized osseous structures are unremarkable in appearance. Sinuses/Orbits: The visualized portions of the orbits are within normal limits. The paranasal sinuses and mastoid air cells are well-aerated. Other: No significant soft tissue abnormalities are seen. CT CERVICAL SPINE FINDINGS Alignment: Normal. Skull base and vertebrae: No acute fracture. No primary bone lesion or focal pathologic process. Soft tissues and spinal canal: No prevertebral fluid or swelling. No visible canal hematoma. Disc levels: Minimal intervertebral disc space narrowing is noted along the lower cervical spine, with anterior disc osteophyte complexes. Mild facet disease is noted along the cervical spine, particularly on the right at C4-C5. Upper chest: The visualized lung apices are clear. The thyroid gland is unremarkable. Other: No additional soft tissue abnormalities are seen. IMPRESSION: 1. No evidence of traumatic intracranial injury or fracture. 2. No evidence fracture or subluxation along the cervical spine. 3. Mild cortical volume loss and scattered  small vessel ischemic microangiopathy. 4. Minimal degenerative change along the cervical spine. Electronically Signed   By: Roanna Raider M.D.   On: 12/15/2016 03:15   Ct Angio Chest Pe W And/or Wo Contrast  Result Date: 12/15/2016 CLINICAL DATA:  Elevated D-dimer, fall with intoxication EXAM: CT ANGIOGRAPHY CHEST WITH CONTRAST TECHNIQUE: Multidetector CT imaging of the chest was performed using the standard protocol during bolus administration of intravenous contrast. Multiplanar CT image reconstructions and MIPs were obtained to evaluate the vascular anatomy. CONTRAST:  ISOVUE-370 IOPAMIDOL (ISOVUE-370) INJECTION 76% COMPARISON:  None. FINDINGS: Cardiovascular: Satisfactory opacification of the pulmonary arteries to the segmental level. No evidence of pulmonary embolism. Normal heart size. No pericardial effusion. Mild atherosclerotic calcification of the aorta. No aneurysmal dilatation. Mediastinum/Nodes: Midline trachea. No thyroid mass. No significantly enlarged lymph nodes. Esophagus within normal limits. Lungs/Pleura: Lungs are clear. No pleural effusion or pneumothorax. Upper Abdomen: Postsurgical changes of the stomach consistent with gastric bypass. No acute abnormality. Musculoskeletal: No chest wall abnormality. No acute or significant osseous findings. Review of the MIP  images confirms the above findings. IMPRESSION: 1. Negative for acute pulmonary embolus. 2. Clear lung fields Aortic Atherosclerosis (ICD10-I70.0). Electronically Signed   By: Jasmine Pang M.D.   On: 12/15/2016 03:58   Ct Cervical Spine Wo Contrast  Result Date: 12/15/2016 CLINICAL DATA:  Status post fall. Hit head, with concern for head or cervical spine injury. Initial encounter. EXAM: CT HEAD WITHOUT CONTRAST CT CERVICAL SPINE WITHOUT CONTRAST TECHNIQUE: Multidetector CT imaging of the head and cervical spine was performed following the standard protocol without intravenous contrast. Multiplanar CT image  reconstructions of the cervical spine were also generated. COMPARISON:  None. FINDINGS: CT HEAD FINDINGS Brain: No evidence of acute infarction, hemorrhage, hydrocephalus, extra-axial collection or mass lesion/mass effect. Prominence of the ventricles and sulci reflects mild cortical volume loss. Mild subcortical white matter change likely reflects small vessel ischemic microangiopathy. The posterior fossa, including the cerebellum, brainstem and fourth ventricle, is within normal limits. The third and lateral ventricles, and basal ganglia are unremarkable in appearance. The cerebral hemispheres are symmetric in appearance, with normal gray-white differentiation. No mass effect or midline shift is seen. Vascular: No hyperdense vessel or unexpected calcification. Skull: There is no evidence of fracture; visualized osseous structures are unremarkable in appearance. Sinuses/Orbits: The visualized portions of the orbits are within normal limits. The paranasal sinuses and mastoid air cells are well-aerated. Other: No significant soft tissue abnormalities are seen. CT CERVICAL SPINE FINDINGS Alignment: Normal. Skull base and vertebrae: No acute fracture. No primary bone lesion or focal pathologic process. Soft tissues and spinal canal: No prevertebral fluid or swelling. No visible canal hematoma. Disc levels: Minimal intervertebral disc space narrowing is noted along the lower cervical spine, with anterior disc osteophyte complexes. Mild facet disease is noted along the cervical spine, particularly on the right at C4-C5. Upper chest: The visualized lung apices are clear. The thyroid gland is unremarkable. Other: No additional soft tissue abnormalities are seen. IMPRESSION: 1. No evidence of traumatic intracranial injury or fracture. 2. No evidence fracture or subluxation along the cervical spine. 3. Mild cortical volume loss and scattered small vessel ischemic microangiopathy. 4. Minimal degenerative change along the  cervical spine. Electronically Signed   By: Roanna Raider M.D.   On: 12/15/2016 03:15    Procedures Procedures (including critical care time)  Medications Ordered in ED Medications  LORazepam (ATIVAN) injection 0-4 mg (2 mg Intravenous Given 12/15/16 0238)    Or  LORazepam (ATIVAN) tablet 0-4 mg ( Oral See Alternative 12/15/16 0238)  LORazepam (ATIVAN) injection 0-4 mg (not administered)    Or  LORazepam (ATIVAN) tablet 0-4 mg (not administered)  thiamine (VITAMIN B-1) tablet 100 mg (not administered)    Or  thiamine (B-1) injection 100 mg (not administered)  iopamidol (ISOVUE-370) 76 % injection (not administered)  famotidine (PEPCID) tablet 20 mg (20 mg Oral Given 12/15/16 0206)  Tdap (BOOSTRIX) injection 0.5 mL (0.5 mLs Intramuscular Given 12/15/16 0210)  sodium chloride 0.9 % bolus 500 mL (0 mLs Intravenous Stopped 12/15/16 0307)  iopamidol (ISOVUE-370) 76 % injection 100 mL (100 mLs Intravenous Contrast Given 12/15/16 0338)     Initial Impression / Assessment and Plan / ED Course  I have reviewed the triage vital signs and the nursing notes.  Pertinent labs & imaging results that were available during my care of the patient were reviewed by me and considered in my medical decision making (see chart for details).  Clinical Course as of Dec 15 616  Wynelle Link Dec 15, 2016  0559 Pt  ambulates without assistance in the ED.  [HM]    Clinical Course User Index [HM] Audreyana Huntsberry, Dahlia ClientHannah, PA-C    Pt presents with chest pain.  He is intoxicated and reports a fall earlier today.  He denies neurologic deficits, vision changes, headache, nausea, vomiting.  He is hyperventilating and reports feeling SOB.  Pt denies risk factors for DVT or previous DVT.  Pt with elevated d-dimer, but CT angio neg for PE.  He denies hx of cancer.  No masses or nodules noted on CT scan.  Abd is soft and nontender.  He denies abd pain and emesis.  Denies night sweats or unintentional weight loss. CXR without  pneumomediastinum, PTX, PNA.  ECG is reassuring; no tachycardia or arrhythmia noted while in the ED.  Initial troponin is negative.  No recent URI, doubt myocarditis.  Pt is very anxious on initial exam.  CT head without acute abnormality or ICH.  5:54 AM Pt reports feeling much better.  CP and SOB have resolved completely.  Patient with elevated anion gap.  I believe this is likely secondary to his alcohol intake.  Doubt alcoholic ketoacidosis. He has no abdominal pain nausea or vomiting.  He has no significant hyperglycemia.  Fluids given.  Small amount of ketones on his urine. Pt has tolerated PO without difficulty.  He has not CP, SOB or palpitations with ambulation here. Discussed close PCP follow-up and reasons to return to the ED.  Pt states understanding and is in agreement with the plan.    Final Clinical Impressions(s) / ED Diagnoses   Final diagnoses:  Alcohol abuse  Chest pain, central  Elevated d-dimer  Fall, initial encounter    ED Discharge Orders    None       Mardene SayerMuthersbaugh, Boyd KerbsHannah, PA-C 12/15/16 [id number]    Shon BatonHorton, Courtney F, MD 12/16/16 [phone]

## 2016-12-15 NOTE — ED Notes (Signed)
In xray

## 2016-12-18 ENCOUNTER — Other Ambulatory Visit: Payer: Self-pay

## 2016-12-18 ENCOUNTER — Encounter (HOSPITAL_COMMUNITY): Payer: Self-pay | Admitting: Emergency Medicine

## 2016-12-18 ENCOUNTER — Emergency Department (HOSPITAL_COMMUNITY)
Admission: EM | Admit: 2016-12-18 | Discharge: 2016-12-19 | Disposition: A | Discharge status: Other Acute Inpatient Hospital | Payer: 59 | Attending: Emergency Medicine | Admitting: Emergency Medicine

## 2016-12-18 DIAGNOSIS — Y908 Blood alcohol level of 240 mg/100 ml or more: Secondary | ICD-10-CM | POA: Diagnosis not present

## 2016-12-18 DIAGNOSIS — Z046 Encounter for general psychiatric examination, requested by authority: Secondary | ICD-10-CM | POA: Insufficient documentation

## 2016-12-18 DIAGNOSIS — F339 Major depressive disorder, recurrent, unspecified: Secondary | ICD-10-CM | POA: Diagnosis present

## 2016-12-18 DIAGNOSIS — F329 Major depressive disorder, single episode, unspecified: Secondary | ICD-10-CM | POA: Diagnosis present

## 2016-12-18 DIAGNOSIS — T510X2A Toxic effect of ethanol, intentional self-harm, initial encounter: Secondary | ICD-10-CM | POA: Diagnosis not present

## 2016-12-18 DIAGNOSIS — T450X2A Poisoning by antiallergic and antiemetic drugs, intentional self-harm, initial encounter: Secondary | ICD-10-CM | POA: Diagnosis not present

## 2016-12-18 DIAGNOSIS — F1010 Alcohol abuse, uncomplicated: Secondary | ICD-10-CM | POA: Diagnosis not present

## 2016-12-18 DIAGNOSIS — T450X1A Poisoning by antiallergic and antiemetic drugs, accidental (unintentional), initial encounter: Secondary | ICD-10-CM | POA: Diagnosis not present

## 2016-12-18 DIAGNOSIS — F1020 Alcohol dependence, uncomplicated: Secondary | ICD-10-CM | POA: Diagnosis present

## 2016-12-18 DIAGNOSIS — T50904A Poisoning by unspecified drugs, medicaments and biological substances, undetermined, initial encounter: Secondary | ICD-10-CM | POA: Diagnosis not present

## 2016-12-18 DIAGNOSIS — I10 Essential (primary) hypertension: Secondary | ICD-10-CM | POA: Insufficient documentation

## 2016-12-18 DIAGNOSIS — F332 Major depressive disorder, recurrent severe without psychotic features: Secondary | ICD-10-CM | POA: Diagnosis not present

## 2016-12-18 DIAGNOSIS — Z79899 Other long term (current) drug therapy: Secondary | ICD-10-CM | POA: Insufficient documentation

## 2016-12-18 DIAGNOSIS — T1491XA Suicide attempt, initial encounter: Secondary | ICD-10-CM | POA: Diagnosis not present

## 2016-12-18 LAB — CBG MONITORING, ED: GLUCOSE-CAPILLARY: 80 mg/dL (ref 65–99)

## 2016-12-18 LAB — CBC
HCT: 42.9 % (ref 39.0–52.0)
Hemoglobin: 14.4 g/dL (ref 13.0–17.0)
MCH: 28.8 pg (ref 26.0–34.0)
MCHC: 33.6 g/dL (ref 30.0–36.0)
MCV: 85.8 fL (ref 78.0–100.0)
PLATELETS: 251 10*3/uL (ref 150–400)
RBC: 5 MIL/uL (ref 4.22–5.81)
RDW: 13.3 % (ref 11.5–15.5)
WBC: 8.9 10*3/uL (ref 4.0–10.5)

## 2016-12-18 LAB — COMPREHENSIVE METABOLIC PANEL
ALBUMIN: 4.1 g/dL (ref 3.5–5.0)
ALT: 38 U/L (ref 17–63)
ANION GAP: 9 (ref 5–15)
AST: 49 U/L — AB (ref 15–41)
Alkaline Phosphatase: 116 U/L (ref 38–126)
BUN: 10 mg/dL (ref 6–20)
CHLORIDE: 106 mmol/L (ref 101–111)
CO2: 26 mmol/L (ref 22–32)
Calcium: 9.1 mg/dL (ref 8.9–10.3)
Creatinine, Ser: 1.02 mg/dL (ref 0.61–1.24)
GFR calc Af Amer: >60 mL/min (ref >60)
GLUCOSE: 86 mg/dL (ref 65–99)
POTASSIUM: 3.7 mmol/L (ref 3.5–5.1)
Sodium: 141 mmol/L (ref 135–145)
TOTAL PROTEIN: 6.9 g/dL (ref 6.5–8.1)
Total Bilirubin: 0.5 mg/dL (ref 0.3–1.2)

## 2016-12-18 LAB — RAPID URINE DRUG SCREEN, HOSP PERFORMED
AMPHETAMINES: NOT DETECTED
Barbiturates: NOT DETECTED
Benzodiazepines: NOT DETECTED
COCAINE: NOT DETECTED
OPIATES: NOT DETECTED
Tetrahydrocannabinol: NOT DETECTED

## 2016-12-18 LAB — SALICYLATE LEVEL: Salicylate Lvl: <7 mg/dL (ref 2.8–30.0)

## 2016-12-18 LAB — ETHANOL: ALCOHOL ETHYL (B): 270 mg/dL — AB (ref <10)

## 2016-12-18 LAB — ACETAMINOPHEN LEVEL

## 2016-12-18 NOTE — ED Notes (Signed)
Patients Wife: Rexford Mausmy Granlund- [phone]- [phone]

## 2016-12-18 NOTE — ED Notes (Signed)
Seizure pads placed on bed as precaution. 

## 2016-12-18 NOTE — ED Triage Notes (Signed)
Pt comes in via friend after reportedly overdosing on 15- 25 mg benadryl and drinking a 5th of liquor over the past "few hours".  Pt reporting dizziness and drowsiness. A&O x4.  Keeps repeating "I don't want to go where the crazy people are. I let my wife and my friend down".  Reportedly told friend he did not want to be here anymore.

## 2016-12-18 NOTE — ED Notes (Signed)
Per PC: Recommends EKG and 4 hour tylenol level, check LFT's, electrolytes, and magnesium.  Hydrate patient.  Increased risk for anticholinergic effects.  Due to patient's medication history risk of conduction effects. If seizure occurs treat with benzos first then phenobarb.  Observation time of 6-8 hours.

## 2016-12-19 ENCOUNTER — Other Ambulatory Visit: Payer: Self-pay

## 2016-12-19 ENCOUNTER — Inpatient Hospital Stay (HOSPITAL_COMMUNITY)
Admission: AD | Admit: 2016-12-19 | Discharge: 2016-12-22 | DRG: 885 | Disposition: A | Discharge status: Home / Self Care | Payer: 59 | Source: Intra-hospital | Attending: Psychiatry | Admitting: Psychiatry

## 2016-12-19 ENCOUNTER — Encounter (HOSPITAL_COMMUNITY): Payer: Self-pay | Admitting: *Deleted

## 2016-12-19 DIAGNOSIS — Z6830 Body mass index (BMI) 30.0-30.9, adult: Secondary | ICD-10-CM | POA: Diagnosis not present

## 2016-12-19 DIAGNOSIS — F332 Major depressive disorder, recurrent severe without psychotic features: Secondary | ICD-10-CM | POA: Diagnosis present

## 2016-12-19 DIAGNOSIS — E669 Obesity, unspecified: Secondary | ICD-10-CM | POA: Diagnosis present

## 2016-12-19 DIAGNOSIS — Y92092 Bedroom in other non-institutional residence as the place of occurrence of the external cause: Secondary | ICD-10-CM

## 2016-12-19 DIAGNOSIS — F4310 Post-traumatic stress disorder, unspecified: Secondary | ICD-10-CM | POA: Diagnosis present

## 2016-12-19 DIAGNOSIS — F1020 Alcohol dependence, uncomplicated: Secondary | ICD-10-CM | POA: Diagnosis present

## 2016-12-19 DIAGNOSIS — Z79899 Other long term (current) drug therapy: Secondary | ICD-10-CM | POA: Diagnosis not present

## 2016-12-19 DIAGNOSIS — Z23 Encounter for immunization: Secondary | ICD-10-CM | POA: Diagnosis not present

## 2016-12-19 DIAGNOSIS — Z9049 Acquired absence of other specified parts of digestive tract: Secondary | ICD-10-CM

## 2016-12-19 DIAGNOSIS — T450X2A Poisoning by antiallergic and antiemetic drugs, intentional self-harm, initial encounter: Secondary | ICD-10-CM | POA: Diagnosis present

## 2016-12-19 DIAGNOSIS — T510X2A Toxic effect of ethanol, intentional self-harm, initial encounter: Secondary | ICD-10-CM | POA: Diagnosis not present

## 2016-12-19 DIAGNOSIS — Z62810 Personal history of physical and sexual abuse in childhood: Secondary | ICD-10-CM | POA: Diagnosis present

## 2016-12-19 DIAGNOSIS — T50904A Poisoning by unspecified drugs, medicaments and biological substances, undetermined, initial encounter: Secondary | ICD-10-CM | POA: Diagnosis not present

## 2016-12-19 DIAGNOSIS — Z8249 Family history of ischemic heart disease and other diseases of the circulatory system: Secondary | ICD-10-CM | POA: Diagnosis not present

## 2016-12-19 DIAGNOSIS — Z9884 Bariatric surgery status: Secondary | ICD-10-CM | POA: Diagnosis not present

## 2016-12-19 DIAGNOSIS — T1491XA Suicide attempt, initial encounter: Secondary | ICD-10-CM

## 2016-12-19 DIAGNOSIS — R450 Nervousness: Secondary | ICD-10-CM | POA: Diagnosis not present

## 2016-12-19 DIAGNOSIS — G4700 Insomnia, unspecified: Secondary | ICD-10-CM | POA: Diagnosis present

## 2016-12-19 DIAGNOSIS — F419 Anxiety disorder, unspecified: Secondary | ICD-10-CM | POA: Diagnosis present

## 2016-12-19 DIAGNOSIS — I10 Essential (primary) hypertension: Secondary | ICD-10-CM | POA: Diagnosis not present

## 2016-12-19 DIAGNOSIS — F339 Major depressive disorder, recurrent, unspecified: Secondary | ICD-10-CM | POA: Diagnosis present

## 2016-12-19 LAB — TSH: TSH: 0.749 u[IU]/mL (ref 0.350–4.500)

## 2016-12-19 LAB — VITAMIN B12: VITAMIN B 12: 350 pg/mL (ref 180–914)

## 2016-12-19 LAB — ACETAMINOPHEN LEVEL

## 2016-12-19 LAB — MAGNESIUM: Magnesium: 1.9 mg/dL (ref 1.7–2.4)

## 2016-12-19 LAB — T4, FREE: FREE T4: 0.81 ng/dL (ref 0.61–1.12)

## 2016-12-19 LAB — SALICYLATE LEVEL

## 2016-12-19 MED ORDER — ONDANSETRON 4 MG PO TBDP: 4.0000 mg | ORAL_TABLET | Freq: Four times a day (QID) | ORAL | Status: DC | PRN

## 2016-12-19 MED ORDER — VITAMIN B-1 100 MG PO TABS: 100.0000 mg | ORAL_TABLET | Freq: Every day | ORAL | Status: DC

## 2016-12-19 MED ORDER — PNEUMOCOCCAL VAC POLYVALENT 25 MCG/0.5ML IJ INJ
0.5000 mL | INJECTION | INTRAMUSCULAR | Status: AC
  Administered 2016-12-20: 0.5 mL via INTRAMUSCULAR

## 2016-12-19 MED ORDER — ONDANSETRON HCL 4 MG PO TABS: 4.0000 mg | ORAL_TABLET | Freq: Three times a day (TID) | ORAL | Status: DC | PRN

## 2016-12-19 MED ORDER — LOSARTAN POTASSIUM 25 MG PO TABS
25.0000 mg | ORAL_TABLET | Freq: Every day | ORAL | Status: DC
  Administered 2016-12-19: 25 mg via ORAL
  Filled 2016-12-19 (×2): qty 1

## 2016-12-19 MED ORDER — FLUOXETINE HCL 20 MG PO CAPS
20.0000 mg | ORAL_CAPSULE | Freq: Every day | ORAL | Status: DC
  Administered 2016-12-19: 20 mg via ORAL
  Filled 2016-12-19: qty 1

## 2016-12-19 MED ORDER — GABAPENTIN 100 MG PO CAPS
200.0000 mg | ORAL_CAPSULE | Freq: Two times a day (BID) | ORAL | Status: DC
  Administered 2016-12-19 (×2): 200 mg via ORAL
  Filled 2016-12-19 (×2): qty 2

## 2016-12-19 MED ORDER — ADULT MULTIVITAMIN W/MINERALS CH
1.0000 | ORAL_TABLET | Freq: Every day | ORAL | Status: DC
  Administered 2016-12-19: 1 via ORAL
  Filled 2016-12-19: qty 1

## 2016-12-19 MED ORDER — LOPERAMIDE HCL 2 MG PO CAPS: 2.0000 mg | ORAL_CAPSULE | ORAL | Status: DC | PRN

## 2016-12-19 MED ORDER — INFLUENZA VAC SPLIT QUAD 0.5 ML IM SUSY
0.5000 mL | PREFILLED_SYRINGE | INTRAMUSCULAR | Status: AC
  Administered 2016-12-20: 0.5 mL via INTRAMUSCULAR
  Filled 2016-12-19: qty 0.5

## 2016-12-19 MED ORDER — ALUM & MAG HYDROXIDE-SIMETH 200-200-20 MG/5ML PO SUSP: 30.0000 mL | Freq: Four times a day (QID) | ORAL | Status: DC | PRN

## 2016-12-19 MED ORDER — HYDROXYZINE HCL 25 MG PO TABS: 25.0000 mg | ORAL_TABLET | Freq: Four times a day (QID) | ORAL | Status: DC | PRN

## 2016-12-19 MED ORDER — THIAMINE HCL 100 MG/ML IJ SOLN
100.0000 mg | Freq: Once | INTRAMUSCULAR | Status: AC
  Administered 2016-12-19: 100 mg via INTRAMUSCULAR
  Filled 2016-12-19: qty 2

## 2016-12-19 MED ORDER — LORAZEPAM 1 MG PO TABS
1.0000 mg | ORAL_TABLET | Freq: Four times a day (QID) | ORAL | Status: DC | PRN
  Administered 2016-12-19: 1 mg via ORAL
  Filled 2016-12-19: qty 1

## 2016-12-19 NOTE — ED Notes (Signed)
Pt reports he takes Losartan. This nurse notified Jameson,NP.

## 2016-12-19 NOTE — BH Assessment (Signed)
[facility] Assessment Progress Note  Per Thedore MinsMojeed Akintayo, MD, this pt requires psychiatric hospitalization at this time.  Malva LimesLinsey Strader, RN, [facility]; they will be ready to receive pt at 20:03.  Pt has signed Voluntary Admission and Consent for Treatment, as well as Consent to Release Information to Milagros Evenerupinder Kaur, MD and to Gates RiggMatt Sandifer, pt's psychiatrist and therapist respectively, and notification calls have been placed to both.  S[facility].  Pt's nurse, Morrie Sheldonshley, has been notified, and agrees to send original paperwork along with pt via [place] [place], and to call report to [phone].  Doylene Canninghomas Miryam Mcelhinney, MA Triage Specialist [phone]

## 2016-12-19 NOTE — ED Notes (Signed)
[facility] [place]. Ambulatory and in stable condition. Belongings given to patient.

## 2016-12-19 NOTE — ED Notes (Signed)
Patient currently denies SI/HI/AVH. It was reported that patient took an overdose PTA. Patient is calm and cooperative at this time. Plan of care discussed. Encouragement and support provided and safety maintain. Q 15 min safety checks in place and video monitoring.

## 2016-12-19 NOTE — Progress Notes (Signed)
12/19/16 1407:  LRT introduced self to pt and offered activities.  Pt declined stating he wasn't feeling well.   Caroll RancherMarjette Claris Guymon, LRT/CTRS

## 2016-12-19 NOTE — ED Provider Notes (Signed)
[facility]-EMERGENCY DEPT Provider Note   CSN: [id number] Arrival date & time: 12/18/16  2142     History   Chief Complaint Chief Complaint  Patient presents with  . Ingestion    HPI Derek [name] is a 56 y.o. male.  HPI Patient with history of alcohol abuse, depression comes in with chief complaint of overdose. According to triage note, patient came in with a friend after reporting 15 into 25 mg Benadryl and heavy alcohol use over the past few hours.  Patient had reported to the friends that he "wanted to go to sleep and not wake up".  Unfortunately, the friend who brought the patient to the ER is not available during my exam.  Patient reports that he was discharged from the ER 3 days ago.  He stayed sober until this evening, when he decided to have a "pretty party"and started drinking alcohol.  At some point he "got stupid" and decided to take Benadryl so that he can fall asleep.  Patient normally takes 2 Benadryl, however he read the instructions and decided to take 15 Benadryl so that he can fall asleep and not wake up.  Patient states that he never had intention to hurt himself, and he just acted stupidly.  Patient does not want to be admitted to the hospital.  Patient's ingestion was about 2 hours prior to ED arrival. Pt denies nausea, emesis, fevers, chills, chest pains, shortness of breath, headaches, abdominal pain, uti like symptoms.   Past Medical History:  Diagnosis Date  . Alcohol abuse   . Depression   . Essential hypertension 11/08/2015  . Hypertension   . Obesity (BMI 30.0-34.9) 11/08/2015  . Sinus bradycardia 11/08/2015    Patient Active Problem List   Diagnosis Date Noted  . Essential hypertension 11/08/2015  . Obesity (BMI 30.0-34.9) 11/08/2015  . Sinus bradycardia 11/08/2015    Past Surgical History:  Procedure Laterality Date  . CHOLECYSTECTOMY    . [facility]     [facility]  . ROTATOR CUFF REPAIR  2017  .  ROUX-EN-Y PROCEDURE  2007  . TONSILLECTOMY    . wisdom teeth extractions         Home Medications    Prior to Admission medications   Medication Sig Start Date End Date Taking? Authorizing Provider  acamprosate (CAMPRAL) 333 MG tablet Take 666 mg by mouth 3 (three) times daily. 06/16/16  Yes [provider]  buPROPion (WELLBUTRIN SR) 150 MG 12 hr tablet Take 150 mg by mouth daily.   Yes [provider]  FLUoxetine (PROZAC) 20 MG capsule Take 20 mg by mouth daily. 06/24/16  Yes [provider]    Family History Family History  Problem Relation Age of Onset  . Cancer Mother   . Peripheral Artery Disease Father   . Heart attack Maternal Grandfather     Social History Social History   Tobacco Use  . Smoking status: Never Smoker  . Smokeless tobacco: Never Used  Substance Use Topics  . Alcohol use: Yes    Comment: history of alcoholism  . Drug use: No     Allergies   Patient has no known allergies.   Review of Systems Review of Systems  Constitutional: Positive for activity change.  Eyes: Negative for photophobia and visual disturbance.  Respiratory: Negative for chest tightness.   Cardiovascular: Negative for chest pain.  Gastrointestinal: Negative for abdominal pain.  Neurological: Positive for dizziness. Negative for tremors, seizures, syncope, speech  difficulty, numbness and headaches.  All other systems reviewed and are negative.    Physical Exam Updated Vital Signs BP (!) 147/99   Pulse 68   Temp 98.2 F (36.8 C)   Resp 13   Ht 6\' 1"  (1.854 m)   Wt 99.8 kg (220 lb)   SpO2 95%   BMI 29.03 kg/m   Physical Exam  Constitutional: He is oriented to person, place, and time. He appears well-developed.  HENT:  Head: Normocephalic and atraumatic.  Eyes: Conjunctivae and EOM are normal. Pupils are equal, round, and reactive to light.  horizontal nystagmus  Neck: Normal range of motion. Neck supple.  Cardiovascular: Normal rate  and regular rhythm.  Pulmonary/Chest: Effort normal and breath sounds normal.  Abdominal: Soft. Bowel sounds are normal. He exhibits no distension and no mass. There is no tenderness. There is no rebound and no guarding.  Musculoskeletal: He exhibits no deformity.  Neurological: He is alert and oriented to person, place, and time. No cranial nerve deficit.  Skin: Skin is warm.  Nursing note and vitals reviewed.    ED Treatments / Results  Labs (all labs ordered are listed, but only abnormal results are displayed) Labs Reviewed  COMPREHENSIVE METABOLIC PANEL - Abnormal; Notable for the following components:      Result Value   AST 49 (*)    All other components within normal limits  ETHANOL - Abnormal; Notable for the following components:   Alcohol, Ethyl (B) 270 (*)    All other components within normal limits  ACETAMINOPHEN LEVEL - Abnormal; Notable for the following components:   Acetaminophen (Tylenol), Serum <10 (*)    All other components within normal limits  SALICYLATE LEVEL  CBC  RAPID URINE DRUG SCREEN, HOSP PERFORMED  CBG MONITORING, ED    EKG  EKG Interpretation  Date/Time:  Wednesday December 18 2016 22:09:15 EST Ventricular Rate:  70 PR Interval:    QRS Duration: 98 QT Interval:  450 QTC Calculation: 486 R Axis:   -69 Text Interpretation:  Sinus or ectopic atrial rhythm Left anterior fascicular block Consider anterior infarct No acute changes No significant change since last tracing Confirmed by Derwood KaplanNanavati, Aracelis Ulrey [phone]([id number]) on 12/18/2016 11:17:58 PM       Radiology No results found.  Procedures Procedures (including critical care time)  Medications Ordered in ED Medications - No data to display   Initial Impression / Assessment and Plan / ED Course  I have reviewed the triage vital signs and the nursing notes.  Pertinent labs & imaging results that were available during my care of the patient were reviewed by me and considered in my medical decision  making (see chart for details).    Patient comes in with chief complaint of alcohol intoxication and overdose.   Patient reports that his intent with overdose was never to kill himself.  He states that he started drinking and acted poorly.  He is noted to have mild nystagmus.  Patient has no other medical complaints.  Patient will be medically cleared 6 hours after ED arrival.  Labs and EKG are overall reassuring besides alcohol intoxication   Final Clinical Impressions(s) / ED Diagnoses   Final diagnoses:  Drug overdose, undetermined intent, initial encounter  Alcohol abuse    ED Discharge Orders    None       Derwood KaplanNanavati, Bryttney Netzer, MD 12/19/16 [phone]

## 2016-12-19 NOTE — Tx Team (Signed)
Initial Treatment Plan 12/19/2016 11:53 PM Derek ConchJames Houseworth [id number]    PATIENT STRESSORS: Financial difficulties Substance abuse Other: "lonliness"   PATIENT STRENGTHS: Ability for insight Active sense of humor Average or above average intelligence Capable of independent living General fund of knowledge Motivation for treatment/growth Supportive family/friends   PATIENT IDENTIFIED PROBLEMS: Depression Substance Abuse "Lonliness" "I need help with my depression"                     DISCHARGE CRITERIA:  Ability to meet basic life and health needs Improved stabilization in mood, thinking, and/or behavior Verbal commitment to aftercare and medication compliance Withdrawal symptoms are absent or subacute and managed without 24-hour nursing intervention  PRELIMINARY DISCHARGE PLAN: Attend aftercare/continuing care group Return to previous living arrangement  PATIENT/FAMILY INVOLVEMENT: This treatment plan has been presented to and reviewed with the patient, Derek [name], and/or family member, .  The patient and family have been given the opportunity to ask questions and make suggestions.  Alee Gressman, [place], [place]RN 12/19/2016, 11:53 PM

## 2016-12-19 NOTE — ED Provider Notes (Signed)
8:55 PM patient is alert Glasgow Coma Score 15 pleasant cooperative ambulates without difficulty no distress.  [facility]   Derek Catino, MD 12/19/16 2100

## 2016-12-19 NOTE — ED Notes (Signed)
Patient aware of his transfer. [facility] are [facility]. ([facility])

## 2016-12-19 NOTE — ED Notes (Signed)
[facility] ready to accept patient. [place] transportation called.

## 2016-12-19 NOTE — ED Notes (Signed)
Pt complaint with medication regimen. Pt reports "feeling overwhelmed" and "feeling lonely" Pt denies SI/HI/AVH. Encouragement and support provided. Special checks q 15 mins in place for safety, Video monitoring in place. Will continue to monitor.
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
## 2016-12-19 NOTE — ED Notes (Signed)
Visitor at bedside.
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
## 2016-12-19 NOTE — Progress Notes (Signed)
Derek [name] is a 56 year old male pt admitted on voluntary basis. He reports depression and alcohol abuse and does report recent suicidal thoughts. He reports that he has been drinking daily and has been through detox and rehab before and reports that he went through detox earlier in the year and reports that he keeps slipping up. He also spoke about depression and feeling lonely and spoke about how he is married and how his wife is still with him "hanging in there with me". He reports that he currently is staying at a halfway house and reports that he plans on going back there at discharge. He reports that he is compliant with medications and reports that he does not abuse any other substances. Derek [name] was oriented to the unit and safety maintained.

## 2016-12-19 NOTE — ED Notes (Signed)
Patient cleared by poison control.   

## 2016-12-19 NOTE — BH Assessment (Signed)
[facility] Assessment Progress Note   Per Donell SievertSpencer Simon, PA pt is recommended for inpt treatment. EDP Dr. Nicanor AlconPalumbo, MD notified of disposition.   Princess BruinsAquicha Karion Cudd, MSW, LCSW Therapeutic Triage Specialist  [phone]

## 2016-12-19 NOTE — ED Notes (Signed)
Bed: [id number] Expected date:  Expected time:  Means of arrival:  Comments: Derek [name], res B

## 2016-12-19 NOTE — ED Notes (Signed)
Pt in room reading magazines.

## 2016-12-19 NOTE — ED Notes (Signed)
Patient seen sleeping

## 2016-12-19 NOTE — Consult Note (Signed)
[facility] Face-to-Face Psychiatry Consult    Patient Identification: Derek [name] MRN:  [id number] Principal Diagnosis: Major depressive disorder, recurrent episode [facility]) Diagnosis:   Patient Active Problem List   Diagnosis Date Noted  . Major depressive disorder, recurrent episode ([place]) [F33.9] 12/19/2016  . Alcohol use disorder, severe, dependence ([place]) [F10.20] 12/19/2016  . Essential hypertension [I10] 11/08/2015  . Obesity (BMI 30.0-34.9) [E66.9] 11/08/2015  . Sinus bradycardia [R00.1] 11/08/2015    Total Time spent with patient: 45 minutes  Subjective:   Derek [name] is a 56 y.o. male patient admitted to the ER following an overdose on benadryl and alcohol. Patient brought into the ER by his friend.  HPI:   Patient presents to the ER following a suicide attempt by taking "about 15 benadryl and 1/5th of liquor". Patient notes that after he took the pills he texted his family "something that made them worried" eluding to him committing suicide. A family friend found him and brought him to the ER. Patient notes that he had his heart on moving home after 3 months of sobriety and living in a local recovery house. After talking with his wife who stated that she was not ready for him to come home despite his 3 months of sobriety he went out and took the benadryl pills with a 1/5th of liquor. Patient further notes that over the last few weeks he had been drinking small amounts of alcohol intermittently. Patient denies suicidal ideation, thoughts of hurting himself or others and feels safe at home.  Past Psychiatric History:  Patient states that he currently sees  Dr. Consuela Mimes and Dr. Gloris Manchester for psychiatric mental health treatment. Patient states he has been taking Prozac, Wellbutrin, Losartan, and Adderall that he feels has been "mostly" effective.  Risk to Self: Suicidal Ideation: Yes-Currently Present Suicidal Intent: Yes-Currently Present Is patient at risk for suicide?: Yes Suicidal Plan?:  Yes-Currently Present Specify Current Suicidal Plan: pt intentionally ingested 15 benadryl PTA mixed with alcohol in a suicide attempt  Access to Means: Yes Specify Access to Suicidal Means: pt has access to benadryl and alcohol  What has been your use of drugs/alcohol within the last 12 months?: reports to daily alcohol use  How many times?: 1 Triggers for Past Attempts: Unpredictable Intentional Self Injurious Behavior: None Risk to Others: Homicidal Ideation: No Thoughts of Harm to Others: No Current Homicidal Intent: No Current Homicidal Plan: No Access to Homicidal Means: No History of harm to others?: No Assessment of Violence: None Noted Does patient have access to weapons?: No Criminal Charges Pending?: No Does patient have a court date: No Prior Inpatient Therapy: Prior Inpatient Therapy: Yes Prior Therapy Dates: 2018, 2011, 2009 Prior Therapy Facilty/Provider(s): [facility] [place]  Reason for Treatment: ALCOHOL DEPENDENCE Prior Outpatient Therapy: Prior Outpatient Therapy: Yes Prior Therapy Dates: current Prior Therapy Facilty/Provider(s): Dr. Toy Care, MD Reason for Treatment: Med management, ongoing counseling  Does patient have an ACCT team?: No Does patient have Intensive In-House Services?  : No Does patient have [facility] services? : No Does patient have P4CC services?: No  Past Medical History:  Past Medical History:  Diagnosis Date  . Alcohol abuse   . Depression   . Essential hypertension 11/08/2015  . Hypertension   . Obesity (BMI 30.0-34.9) 11/08/2015  . Sinus bradycardia 11/08/2015    Past Surgical History:  Procedure Laterality Date  . CHOLECYSTECTOMY    . GASTRIC BYPASS     2006 - done at [facility]  . ROTATOR CUFF REPAIR  2017  .  ROUX-EN-Y PROCEDURE  2007  . SHOULDER ARTHROSCOPY WITH SUBACROMIAL DECOMPRESSION Right 04/17/2015   Procedure: SHOULDER ARTHROSCOPY ROTATOR CUFF DEBRIDEMENT WITH SUBACROMIAL DECOMPRESSION;  Surgeon: Tania Ade, MD;  Location:  [facility];  Service: Orthopedics;  Laterality: Right;  . TONSILLECTOMY    . wisdom teeth extractions     Family History:  Family History  Problem Relation Age of Onset  . Cancer Mother   . Peripheral Artery Disease Father   . Heart attack Maternal Grandfather    Family Psychiatric  History: depression Social History:  Social History   Substance and Sexual Activity  Alcohol Use Yes   Comment: history of alcoholism     Social History   Substance and Sexual Activity  Drug Use No    Social History   Socioeconomic History  . Marital status: Married    Spouse name: None  . Number of children: None  . Years of education: None  . Highest education level: None  Social Needs  . Financial resource strain: None  . Food insecurity - worry: None  . Food insecurity - inability: None  . Transportation needs - medical: None  . Transportation needs - non-medical: None  Occupational History  . None  Tobacco Use  . Smoking status: Never Smoker  . Smokeless tobacco: Never Used  Substance and Sexual Activity  . Alcohol use: Yes    Comment: history of alcoholism  . Drug use: No  . Sexual activity: None  Other Topics Concern  . None  Social History Narrative  . None   Additional Social History:    Allergies:  No Known Allergies  Labs:  Results for orders placed or performed during the hospital encounter of 12/18/16 (from the past 48 hour(s))  Comprehensive metabolic panel     Status: Abnormal   Collection Time: 12/18/16  9:55 PM  Result Value Ref Range   Sodium 141 135 - 145 mmol/L   Potassium 3.7 3.5 - 5.1 mmol/L   Chloride 106 101 - 111 mmol/L   CO2 26 22 - 32 mmol/L   Glucose, Bld 86 65 - 99 mg/dL   BUN 10 6 - 20 mg/dL   Creatinine, Ser 1.02 0.61 - 1.24 mg/dL   Calcium 9.1 8.9 - 10.3 mg/dL   Total Protein 6.9 6.5 - 8.1 g/dL   Albumin 4.1 3.5 - 5.0 g/dL   AST 49 (H) 15 - 41 U/L   ALT 38 17 - 63 U/L   Alkaline Phosphatase 116 38 - 126 U/L   Total  Bilirubin 0.5 0.3 - 1.2 mg/dL   GFR calc non Af Amer >60 >60 mL/min   GFR calc Af Amer >60 >60 mL/min    Comment: (NOTE) The eGFR has been calculated using the CKD EPI equation. This calculation has not been validated in all clinical situations. eGFR's persistently <60 mL/min signify possible Chronic Kidney Disease.    Anion gap 9 5 - 15  Ethanol     Status: Abnormal   Collection Time: 12/18/16  9:55 PM  Result Value Ref Range   Alcohol, Ethyl (B) 270 (H) <10 mg/dL    Comment:        LOWEST DETECTABLE LIMIT FOR SERUM ALCOHOL IS 10 mg/dL FOR MEDICAL PURPOSES ONLY   Salicylate level     Status: None   Collection Time: 12/18/16  9:55 PM  Result Value Ref Range   Salicylate Lvl <8.4 2.8 - 30.0 mg/dL  Acetaminophen level     Status: Abnormal  Collection Time: 12/18/16  9:55 PM  Result Value Ref Range   Acetaminophen (Tylenol), Serum <10 (L) 10 - 30 ug/mL    Comment:        THERAPEUTIC CONCENTRATIONS VARY SIGNIFICANTLY. A RANGE OF 10-30 ug/mL MAY BE AN EFFECTIVE CONCENTRATION FOR MANY PATIENTS. HOWEVER, SOME ARE BEST TREATED AT CONCENTRATIONS OUTSIDE THIS RANGE. ACETAMINOPHEN CONCENTRATIONS >150 ug/mL AT 4 HOURS AFTER INGESTION AND >50 ug/mL AT 12 HOURS AFTER INGESTION ARE OFTEN ASSOCIATED WITH TOXIC REACTIONS.   cbc     Status: None   Collection Time: 12/18/16  9:55 PM  Result Value Ref Range   WBC 8.9 4.0 - 10.5 K/uL   RBC 5.00 4.22 - 5.81 MIL/uL   Hemoglobin 14.4 13.0 - 17.0 g/dL   HCT 42.9 39.0 - 52.0 %   MCV 85.8 78.0 - 100.0 fL   MCH 28.8 26.0 - 34.0 pg   MCHC 33.6 30.0 - 36.0 g/dL   RDW 13.3 11.5 - 15.5 %   Platelets 251 150 - 400 K/uL  Magnesium     Status: None   Collection Time: 12/18/16  9:55 PM  Result Value Ref Range   Magnesium 1.9 1.7 - 2.4 mg/dL  Rapid urine drug screen (hospital performed)     Status: None   Collection Time: 12/18/16 10:06 PM  Result Value Ref Range   Opiates NONE DETECTED NONE DETECTED   Cocaine NONE DETECTED NONE DETECTED    Benzodiazepines NONE DETECTED NONE DETECTED   Amphetamines NONE DETECTED NONE DETECTED   Tetrahydrocannabinol NONE DETECTED NONE DETECTED   Barbiturates NONE DETECTED NONE DETECTED    Comment:        DRUG SCREEN FOR MEDICAL PURPOSES ONLY.  IF CONFIRMATION IS NEEDED FOR ANY PURPOSE, NOTIFY LAB WITHIN 5 DAYS.        LOWEST DETECTABLE LIMITS FOR URINE DRUG SCREEN Drug Class       Cutoff (ng/mL) Amphetamine      1000 Barbiturate      200 Benzodiazepine   397 Tricyclics       673 Opiates          300 Cocaine          300 THC              50   CBG monitoring, ED     Status: None   Collection Time: 12/18/16 10:32 PM  Result Value Ref Range   Glucose-Capillary 80 65 - 99 mg/dL  Acetaminophen level     Status: Abnormal   Collection Time: 12/19/16  3:06 AM  Result Value Ref Range   Acetaminophen (Tylenol), Serum <10 (L) 10 - 30 ug/mL    Comment:        THERAPEUTIC CONCENTRATIONS VARY SIGNIFICANTLY. A RANGE OF 10-30 ug/mL MAY BE AN EFFECTIVE CONCENTRATION FOR MANY PATIENTS. HOWEVER, SOME ARE BEST TREATED AT CONCENTRATIONS OUTSIDE THIS RANGE. ACETAMINOPHEN CONCENTRATIONS >150 ug/mL AT 4 HOURS AFTER INGESTION AND >50 ug/mL AT 12 HOURS AFTER INGESTION ARE OFTEN ASSOCIATED WITH TOXIC REACTIONS.   Salicylate level     Status: None   Collection Time: 12/19/16  3:06 AM  Result Value Ref Range   Salicylate Lvl <4.1 2.8 - 30.0 mg/dL    Current Facility-Administered Medications  Medication Dose Route Frequency Provider Last Rate Last Dose  . alum & mag hydroxide-simeth (MAALOX/MYLANTA) 200-200-20 MG/5ML suspension 30 mL  30 mL Oral Q6H PRN Palumbo, April, MD      . FLUoxetine (PROZAC) capsule 20 mg  20 mg Oral  Daily Corena Pilgrim, MD   20 mg at 12/19/16 1121  . gabapentin (NEURONTIN) capsule 200 mg  200 mg Oral BID Darleene Cleaver, Racquel Arkin, MD   200 mg at 12/19/16 1121  . ondansetron (ZOFRAN) tablet 4 mg  4 mg Oral Q8H PRN Palumbo, April, MD       Current Outpatient Medications   Medication Sig Dispense Refill  . acamprosate (CAMPRAL) 333 MG tablet Take 666 mg by mouth 3 (three) times daily.    Marland Kitchen buPROPion (WELLBUTRIN SR) 150 MG 12 hr tablet Take 150 mg by mouth daily.    Marland Kitchen FLUoxetine (PROZAC) 20 MG capsule Take 20 mg by mouth daily.  0     Psychiatric Specialty Exam: Physical Exam  Constitutional: He is oriented to person, place, and time. He appears well-developed and well-nourished.  HENT:  Head: Normocephalic.  Neck: Normal range of motion.  Respiratory: Effort normal.  Musculoskeletal: Normal range of motion.  Neurological: He is alert and oriented to person, place, and time.  Psychiatric: His speech is normal. His affect is blunt. He is aggressive and withdrawn. He is not actively hallucinating. Cognition and memory are normal. He expresses impulsivity. He expresses suicidal ideation. He expresses suicidal plans. He is attentive.    ROS  Blood pressure (!) 138/115, pulse 70, temperature 99.7 F (37.6 C), temperature source Oral, resp. rate 16, height _0  (1.854 m), weight 99.8 kg (220 lb), SpO2 99 %.Body mass index is 29.03 kg/m.  General Appearance: Casual  Eye Contact:  Fair  Speech:  Clear and Coherent  Volume:  Normal  Mood:  Euthymic  Affect:  Depressed  Thought Process:  Coherent  Orientation:  Full (Time, Place, and Person)  Thought Content:  Logical  Suicidal Thoughts:  Yes.  with intent/plan  Homicidal Thoughts:  No  Memory:  Immediate;   Good Recent;   Good Remote;   Good  Judgement:  Impaired  Insight:  Fair  Psychomotor Activity:  Normal  Concentration:  Concentration: Good and Attention Span: Good  Recall:  Good  Fund of Knowledge:  Good  Language:  Good  Akathisia:  No  Handed:  Right  AIMS (if indicated):     Assets:  Communication Skills Desire for Improvement Financial Resources/Insurance Resilience Social Support  ADL's:  Intact  Cognition:  WNL  Sleep:        Treatment Plan Summary: Daily contact with  patient to assess and evaluate symptoms and progress in treatment  Major depressive disorder, recurrent, severe without psychosis: -Crisis stabilization -Medication management:  Started Prozac 20 mg daily for depression and Gabapentin 200 mg BID for detox along with Ativan alcohol detox protocol and his medical medications -Individual and substance abuse counseling  Disposition: Recommend psychiatric Inpatient admission when medically cleared.  Waylan Boga, NP 12/19/2016 2:41 PM  Patient seen face-to-face for psychiatric evaluation, chart reviewed and case discussed with the physician extender and developed treatment plan. Reviewed the information documented and agree with the treatment plan. Corena Pilgrim, MD

## 2016-12-20 DIAGNOSIS — F332 Major depressive disorder, recurrent severe without psychotic features: Secondary | ICD-10-CM | POA: Diagnosis present

## 2016-12-20 MED ORDER — VITAMIN B-1 100 MG PO TABS
100.0000 mg | ORAL_TABLET | Freq: Every day | ORAL | Status: DC
  Administered 2016-12-20 – 2016-12-22 (×3): 100 mg via ORAL
  Filled 2016-12-20 (×4): qty 1

## 2016-12-20 MED ORDER — FLUOXETINE HCL 20 MG PO CAPS
40.0000 mg | ORAL_CAPSULE | Freq: Every day | ORAL | Status: DC
Start: 2016-12-20 — End: 2016-12-22
  Administered 2016-12-20 – 2016-12-22 (×3): 40 mg via ORAL
  Filled 2016-12-20 (×5): qty 2

## 2016-12-20 MED ORDER — LOSARTAN POTASSIUM 25 MG PO TABS
25.0000 mg | ORAL_TABLET | Freq: Every day | ORAL | Status: DC
  Administered 2016-12-20 – 2016-12-22 (×3): 25 mg via ORAL
  Filled 2016-12-20 (×5): qty 1

## 2016-12-20 MED ORDER — MAGNESIUM HYDROXIDE 400 MG/5ML PO SUSP: 30.0000 mL | Freq: Every day | ORAL | Status: DC | PRN

## 2016-12-20 MED ORDER — ACETAMINOPHEN 325 MG PO TABS
650.0000 mg | ORAL_TABLET | Freq: Four times a day (QID) | ORAL | Status: DC | PRN
  Administered 2016-12-20: 650 mg via ORAL
  Filled 2016-12-20: qty 2

## 2016-12-20 MED ORDER — LORAZEPAM 1 MG PO TABS: 1.0000 mg | ORAL_TABLET | Freq: Four times a day (QID) | ORAL | Status: DC | PRN

## 2016-12-20 MED ORDER — LORAZEPAM 1 MG PO TABS
1.0000 mg | ORAL_TABLET | Freq: Two times a day (BID) | ORAL | Status: DC
  Filled 2016-12-20 (×2): qty 1

## 2016-12-20 MED ORDER — LORAZEPAM 1 MG PO TABS
1.0000 mg | ORAL_TABLET | Freq: Three times a day (TID) | ORAL | Status: AC
  Administered 2016-12-21 – 2016-12-22 (×3): 1 mg via ORAL
  Filled 2016-12-20 (×2): qty 1

## 2016-12-20 MED ORDER — ACAMPROSATE CALCIUM 333 MG PO TBEC
666.0000 mg | DELAYED_RELEASE_TABLET | Freq: Three times a day (TID) | ORAL | Status: DC
  Administered 2016-12-20 – 2016-12-22 (×7): 666 mg via ORAL
  Filled 2016-12-20 (×12): qty 2

## 2016-12-20 MED ORDER — ADULT MULTIVITAMIN W/MINERALS CH
1.0000 | ORAL_TABLET | Freq: Every day | ORAL | Status: DC
  Administered 2016-12-20 – 2016-12-22 (×3): 1 via ORAL
  Filled 2016-12-20 (×5): qty 1

## 2016-12-20 MED ORDER — TRAZODONE HCL 50 MG PO TABS
50.0000 mg | ORAL_TABLET | Freq: Every evening | ORAL | Status: DC | PRN
  Administered 2016-12-20 – 2016-12-21 (×3): 50 mg via ORAL
  Filled 2016-12-20 (×9): qty 1

## 2016-12-20 MED ORDER — ALUM & MAG HYDROXIDE-SIMETH 200-200-20 MG/5ML PO SUSP: 30.0000 mL | ORAL | Status: DC | PRN

## 2016-12-20 MED ORDER — ONDANSETRON 4 MG PO TBDP: 4.0000 mg | ORAL_TABLET | Freq: Four times a day (QID) | ORAL | Status: DC | PRN

## 2016-12-20 MED ORDER — LOPERAMIDE HCL 2 MG PO CAPS
2.0000 mg | ORAL_CAPSULE | ORAL | Status: DC | PRN
  Filled 2016-12-20: qty 1

## 2016-12-20 MED ORDER — HYDROXYZINE HCL 25 MG PO TABS: 25.0000 mg | ORAL_TABLET | Freq: Four times a day (QID) | ORAL | Status: DC | PRN

## 2016-12-20 MED ORDER — LORAZEPAM 1 MG PO TABS: 1.0000 mg | ORAL_TABLET | Freq: Every day | ORAL | Status: DC

## 2016-12-20 MED ORDER — BUPROPION HCL ER (XL) 150 MG PO TB24
150.0000 mg | ORAL_TABLET | Freq: Every day | ORAL | Status: DC
  Administered 2016-12-20 – 2016-12-22 (×3): 150 mg via ORAL
  Filled 2016-12-20 (×5): qty 1

## 2016-12-20 MED ORDER — LORAZEPAM 1 MG PO TABS
1.0000 mg | ORAL_TABLET | Freq: Four times a day (QID) | ORAL | Status: AC
  Administered 2016-12-20 (×4): 1 mg via ORAL
  Filled 2016-12-20 (×4): qty 1

## 2016-12-20 NOTE — [facility] Suicide Risk Assessment (Signed)
[facility]   Nursing information obtained from:    Demographic factors:    Current Mental Status:    Loss Factors:    Historical Factors:    Risk Reduction Factors:     Total Time spent with patient: 45 minutes Principal Problem: Severe recurrent major depression without psychotic features (HCC) Diagnosis:   Patient Active Problem List   Diagnosis Date Noted  . Severe recurrent major depression without psychotic features (HCC) [F33.2] 12/20/2016  . Major depressive disorder, recurrent episode ([facility]) [F33.9] 12/19/2016  . Alcohol use disorder, severe, dependence ([facility]CC) [F10.20] 12/19/2016  . Essential hypertension [I10] 11/08/2015  . Obesity (BMI 30.0-34.9) [E66.9] 11/08/2015  . Sinus bradycardia [R00.1] 11/08/2015   Subjective Data:  Derek [name] is a 56 y/o M with history of MDD and alcohol use disorder who was admitted after overdose of benadryl. Pt reports that he was not intending to end his life but he would be accepting if that was the outcome of his overdose. Pt shares details that he was intoxicated and feeling overwhelmed when he took the overdose, which was impulsive and not a premeditated behavior. Pt cites stressors of living in a sober living house in the basement and feeling isolated and lonely. He began to have increased anxiety and felt "like the room was caving in" so he used alcohol to address his symptoms and he felt worsened depression. Pt then took about 15 benadryl tablets of 25mg  in an attempt to fall asleep. He immediately did not feel well and told staff who brought him in for assessment. Today upon evaluation, pt reports that he feels embarrassed about his behaviors and he is surprised that he acted the way he did. Pt has relevant history of his 1st wife completing suicide, but he notes that he feels much differently about his own behaviors because he has been reaching out for help. Pt denies current SI/HI/AH/VH. He denies symptoms of mania,  OCD, and PTSD. He is future oriented about returning to treatment and working on his relationship with his current wife with plan to move back home in the coming weeks. Pt is also future-oriented about helping out with his son's school events starting next week. Discussed with patient about treatment options; he reports that he has been taking prozac 40mg  at home as well as wellbutrin XL 150mg  qDay. He has not been taking Campral three times per day as it is prescribed, but rather he has been taking it once per day. We discussed restarting his home medications, and pt was in agreement. He had no further questions, comments, or concerns.    Continued Clinical Symptoms:  Alcohol Use Disorder Identification Test Final Score (AUDIT): 31 The "Alcohol Use Disorders Identification Test", Guidelines for Use in Primary Care, Second Edition.  World Science writerHealth Organization [facility](WHO). Score between 0-7:  no or low risk or alcohol related problems. Score between 8-15:  moderate risk of alcohol related problems. Score between 16-19:  high risk of alcohol related problems. Score 20 or above:  warrants further diagnostic evaluation for alcohol dependence and treatment.   CLINICAL FACTORS:   Severe Anxiety and/or Agitation Depression:   Comorbid alcohol abuse/dependence Alcohol/Substance Abuse/Dependencies More than one psychiatric diagnosis Previous Psychiatric Diagnoses and Treatments Medical Diagnoses and Treatments/Surgeries   Musculoskeletal: Strength & Muscle Tone: within normal limits Gait & Station: normal Patient leans: N/A  Psychiatric Specialty Exam: Physical Exam  Nursing note and vitals reviewed.   Review of Systems  Constitutional: Negative for chills and fever.  Respiratory: Negative for cough and shortness of breath.   Cardiovascular: Negative for chest pain.  Gastrointestinal: Negative for abdominal pain, heartburn, nausea and vomiting.  Psychiatric/Behavioral: Negative for depression,  hallucinations and suicidal ideas. The patient is not nervous/anxious.     Blood pressure (!) 156/78, pulse 75, temperature 98.3 F (36.8 C), temperature source Oral, resp. rate 18, height 6\' 1"  (1.854 m), weight 102.1 kg (225 lb).Body mass index is 29.69 kg/m.  General Appearance: Casual and Fairly Groomed  Eye Contact:  Good  Speech:  Clear and Coherent and Normal Rate  Volume:  Normal  Mood:  Euthymic  Affect:  Congruent and Full Range  Thought Process:  Coherent and Linear  Orientation:  Full (Time, Place, and Person)  Thought Content:  Logical  Suicidal Thoughts:  No  Homicidal Thoughts:  No  Memory:  Immediate;   Good Recent;   Good Remote;   Good  Judgement:  Fair  Insight:  Fair  Psychomotor Activity:  Normal  Concentration:  Concentration: Fair  Recall:  Fair  Fund of Knowledge:  Good  Language:  Good  Akathisia:  No  Handed:    AIMS (if indicated):     Assets:  Communication Skills Leisure Time Physical Health Resilience Social Support  ADL's:  Intact  Cognition:  WNL  Sleep:  Number of Hours: 6      COGNITIVE FEATURES THAT CONTRIBUTE TO RISK:  None    SUICIDE RISK:   Minimal: No identifiable suicidal ideation.  Patients presenting with no risk factors but with morbid ruminations; may be classified as minimal risk based on the severity of the depressive symptoms  PLAN OF CARE:  - Admit to inpatient psychiatry unit - MDD  - Restart prozac 40mg  qDay  - Restart Wellbutrin XL 150mg  qDay  - Start vistaril 25mg  q6h prn anxiety - Alcohol use disorder  - Continue CIWA protocol with ativan  - Restart Campral 666mg  TID - Insomnia  - Continue trazodone 50mg  qhs prn insomnia - HTN  - Continue losartan 25mg  qDay - Encourage participation in groups and the therapeutic milieu - Discharge planning will be ongoing  I certify that inpatient services furnished can reasonably be expected to improve the patient's condition.   Micheal Likenshristopher T Makayli Bracken,  MD 12/20/2016, 3:42 PM
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
## 2016-12-20 NOTE — Progress Notes (Signed)
Patient attended AA group meeting.  

## 2016-12-20 NOTE — Progress Notes (Addendum)
  [facility] Discharge Plan :  Will you be returning to the same living situation after discharge:  Yes,  [facility] to [facility] At discharge, do you have transportation home?: Yes,  Derek [name] or Derek [name] IS TENTATIVELY SCHEDULED FOR DISCHARGE ON Sunday, 12/22/16 PER DR. RAINVILLE.  Do you have the ability to pay for your medications: Yes,  [facility]  Release of information consent forms completed and submitted to medical records by CSW.  Patient to Follow up at: Follow-up Information    Milagros EvenerKaur, Rupinder, MD Follow up on 12/24/2016.   Specialty:  Psychiatry Why:  Hospital follow-up/medication management with Dr. Evelene CroonKaur at 4:15PM on Tuesday. Thank you.  Contact information: [address] [address] [place] [address] [phone]        Gates RiggMatt Sandifer ([facility]) Follow up on 12/25/2016.   Why:  Therapy appt on Wednesday at 3:00PM with Matt. Thank you.  Contact information: [address] [place], [place] [address] Phone: [phone] Fax: [phone]          Next level of care provider has [facility] to [facility] Link:no  Safety Planning and Suicide Prevention discussed: Yes,  SPE completed with pt's [name]. SPI pamphlet and Mobile Crisis information provided to pt.   Have you used any form of tobacco in the last 30 days? (Cigarettes, Smokeless Tobacco, Cigars, and/or Pipes): No  Has patient been referred to the Quitline?: N/A patient is not a smoker  Patient has been referred for addiction treatment: Yes  Pulte HomesHeather N Smart, LCSW 12/20/2016, 3:26 PM

## 2016-12-20 NOTE — Tx Team (Signed)
Interdisciplinary Treatment and Diagnostic Plan Update  12/20/2016 Time of Session: 0830AM Derek [name] MRN: [id number]  Principal Diagnosis: MDD, severe, recurrent.   Secondary Diagnoses: Active Problems:   Severe recurrent major depression without psychotic features (HCC)   Current Medications:  Current Facility-Administered Medications  Medication Dose Route Frequency Provider Last Rate Last Dose  . acetaminophen (TYLENOL) tablet 650 mg  650 mg Oral Q6H PRN Lindon Romp A, NP   650 mg at 12/20/16 0020  . alum & mag hydroxide-simeth (MAALOX/MYLANTA) 200-200-20 MG/5ML suspension 30 mL  30 mL Oral Q4H PRN Lindon Romp A, NP      . hydrOXYzine (ATARAX/VISTARIL) tablet 25 mg  25 mg Oral Q6H PRN Lindon Romp A, NP      . loperamide (IMODIUM) capsule 2-4 mg  2-4 mg Oral PRN Lindon Romp A, NP      . LORazepam (ATIVAN) tablet 1 mg  1 mg Oral Q6H PRN Lindon Romp A, NP      . LORazepam (ATIVAN) tablet 1 mg  1 mg Oral QID Lindon Romp A, NP   1 mg at 12/20/16 0840   Followed by  . [START ON 12/21/2016] LORazepam (ATIVAN) tablet 1 mg  1 mg Oral TID Rozetta Nunnery, NP       Followed by  . [START ON 12/22/2016] LORazepam (ATIVAN) tablet 1 mg  1 mg Oral BID Rozetta Nunnery, NP       Followed by  . [START ON 12/23/2016] LORazepam (ATIVAN) tablet 1 mg  1 mg Oral Daily Lindon Romp A, NP      . magnesium hydroxide (MILK OF MAGNESIA) suspension 30 mL  30 mL Oral Daily PRN Lindon Romp A, NP      . multivitamin with minerals tablet 1 tablet  1 tablet Oral Daily Lindon Romp A, NP   1 tablet at 12/20/16 0841  . ondansetron (ZOFRAN-ODT) disintegrating tablet 4 mg  4 mg Oral Q6H PRN Rozetta Nunnery, NP      . Derrill Memo ON 12/21/2016] thiamine (VITAMIN B-1) tablet 100 mg  100 mg Oral Daily Lindon Romp A, NP   100 mg at 12/20/16 0840  . traZODone (DESYREL) tablet 50 mg  50 mg Oral QHS,MR X 1 Lindon Romp A, NP   50 mg at 12/20/16 0021   PTA Medications: Medications Prior to Admission  Medication Sig  Dispense Refill Last Dose  . acamprosate (CAMPRAL) 333 MG tablet Take 666 mg by mouth 3 (three) times daily.   12/18/2016 at Unknown time  . buPROPion (WELLBUTRIN SR) 150 MG 12 hr tablet Take 150 mg by mouth daily.   12/18/2016 at Unknown time  . FLUoxetine (PROZAC) 20 MG capsule Take 20 mg by mouth daily.  0 12/18/2016 at Unknown time    Patient Stressors: Financial difficulties Substance abuse Other: "lonliness"  Patient Strengths: Ability for insight Active sense of humor Average or above average intelligence Capable of independent living General fund of knowledge Motivation for treatment/growth Supportive family/friends  Treatment Modalities: Medication Management, Group therapy, Case management,  1 to 1 session with clinician, Psychoeducation, Recreational therapy.   Physician Treatment Plan for Primary Diagnosis: MDD, severe, recurrent.    Medication Management: Evaluate patient's response, side effects, and tolerance of medication regimen.  Therapeutic Interventions: 1 to 1 sessions, Unit Group sessions and Medication administration.  Evaluation of Outcomes: Not Met  Physician Treatment Plan for Secondary Diagnosis: Active Problems:   Severe recurrent major depression without psychotic features ([place])     Medication  Management: Evaluate patient's response, side effects, and tolerance of medication regimen.  Therapeutic Interventions: 1 to 1 sessions, Unit Group sessions and Medication administration.  Evaluation of Outcomes: Not Met   RN Treatment Plan for Primary Diagnosis: MDD, severe, recurrent.  Long Term Goal(s): Knowledge of disease and therapeutic regimen to maintain health will improve  Short Term Goals: Ability to remain free from injury will improve, Ability to verbalize feelings will improve and Ability to disclose and discuss suicidal ideas  Medication Management: RN will administer medications as ordered by provider, will assess and evaluate  patient's response and provide education to patient for prescribed medication. RN will report any adverse and/or side effects to prescribing provider.  Therapeutic Interventions: 1 on 1 counseling sessions, Psychoeducation, Medication administration, Evaluate responses to treatment, Monitor vital signs and CBGs as ordered, Perform/monitor CIWA, COWS, AIMS and Fall Risk screenings as ordered, Perform wound care treatments as ordered.  Evaluation of Outcomes: Progressing   LCSW Treatment Plan for Primary Diagnosis: MDD, severe, recurrent.  Long Term Goal(s): Safe transition to appropriate next level of care at discharge, Engage patient in therapeutic group addressing interpersonal concerns.  Short Term Goals: Engage patient in aftercare planning with referrals and resources, Facilitate patient progression through stages of change regarding substance use diagnoses and concerns and Identify triggers associated with mental health/substance abuse issues  Therapeutic Interventions: Assess for all discharge needs, 1 to 1 time with Social worker, Explore available resources and support systems, Assess for adequacy in community support network, Educate family and significant other(s) on suicide prevention, Complete Psychosocial Assessment, Interpersonal group therapy.  Evaluation of Outcomes: Not Met   Progress in Treatment: Attending groups: No. Participating in groups: No.New to unit. Continuing to assess.  Taking medication as prescribed: Yes. Toleration medication: Yes. Family/Significant other contact made: No, will contact:  family member if patient consents. Patient understands diagnosis: Yes. Discussing patient identified problems/goals with staff: Yes. Medical problems stabilized or resolved: Yes. Denies suicidal/homicidal ideation:No. Passive SI/able to contract for safety on the unit.  Issues/concerns per patient self-inventory: No. Other: n/a   New problem(s) identified: No, Describe:   N/A  New Short Term/Long Term Goal(s): elimination of SI thoughts, detox/medication management for mood stabilization, development of comprehensive mental wellness/sobriety plan.   Discharge Plan or Barriers: CSW assessing for appropriate referrals. Pt to be provided with AA/NA list and Brent pamphlet for additional community supports.   Reason for Continuation of Hospitalization: Depression Medication stabilization Suicidal ideation Withdrawal symptoms  Estimated Length of Stay: Tuesday, 12/24/16  Attendees: Patient: 12/20/2016 10:53 AM  Physician: Dr. Nancy Fetter MD; Dr. Parke Poisson MD 12/20/2016 10:53 AM  Nursing: Arna Medici RN 12/20/2016 10:53 AM  RN Care Manager: Lars Pinks CM 12/20/2016 10:53 AM  Social Worker: Maxie Better, LCSW 12/20/2016 10:53 AM  Recreational Therapist: x 12/20/2016 10:53 AM  Other: Lindell Spar NP; Ricky Ala NP 12/20/2016 10:53 AM  Other:  12/20/2016 10:53 AM  Other: 12/20/2016 10:53 AM    Scribe for Treatment Team: [place], LCSW 12/20/2016 10:53 AM

## 2016-12-20 NOTE — Progress Notes (Signed)
Recreation Therapy Notes  Date: 12/20/16 Time: 0930 Location: [address] Dayroom  Group Topic: Stress Management  Goal Area(s) Addresses:  Patient will verbalize importance of using healthy stress management.  Patient will identify positive emotions associated with healthy stress management.   Behavioral Response: Engaged  Intervention: Stress Management  Activity :  Press photographerAuthentic Self Meditation.  LRT introduced the stress management technique of meditation.  LRT lead patients in a meditation that allowed them to explore the things that make them who they are.  Education:  Stress Management, Discharge Planning.   Education Outcome: Acknowledges edcuation/In group clarification offered/Needs additional education  Clinical Observations/Feedback: Pt attended group.    Caroll RancherMarjette Karliah Kowalchuk, LRT/CTRS        Caroll RancherLindsay, Carnisha Feltz A 12/20/2016 12:10 PM

## 2016-12-20 NOTE — [facility] Suicide Risk Assessment (Signed)
[facility] INPATIENT:  Family/Significant Other Suicide Prevention Education  Suicide Prevention Education:  Education Completed; Derek [name] (pt's wife) [phone] has been identified by the patient as the family member/significant other with whom the patient will be residing, and identified as the person(s) who will aid the patient in the event of a mental health crisis (suicidal ideations/suicide attempt).  With written consent from the patient, the family member/significant other has been provided the following suicide prevention education, prior to the and/or following the discharge of the patient.  The suicide prevention education provided includes the following:  Suicide risk factors  Suicide prevention and interventions  National Suicide Hotline telephone number  [facility] assessment telephone number  [facility] Emergency Assistance 911  [facility] and/or Residential Mobile Crisis Unit telephone number  Request made of family/significant other to:  Remove weapons (e.g., guns, rifles, knives), all items previously/currently identified as safety concern.    Remove drugs/medications (over-the-counter, prescriptions, illicit drugs), all items previously/currently identified as a safety concern.  The family member/significant other verbalizes understanding of the suicide prevention education information provided.  The family member/significant other agrees to remove the items of safety concern listed above.  SPE completed with pt's wife and aftercare reviewed. Pt's wife reports that pt does not have access to guns/firearms; [facility]. Pt's wife shared that this was pt's first suicide/overdose attempt. She has no other questions or concerns. Pt's wife given resources for her and family (additionally family support)-[facility] friends and family group; Al-Anon; Al-Ateen for their kids.   Brenton Joines N Smart LCSW 12/20/2016, 2:25 PM

## 2016-12-20 NOTE — Progress Notes (Signed)
Psychoeducational Group Note  Date:  12/20/2016 Time:  2405  Group Topic/Focus:  Wrap-Up Group:   The focus of this group is to help patients review their daily goal of treatment and discuss progress on daily workbooks.  Participation Level: Did Not Attend  Participation Quality:  Not Applicable  Affect:  Not Applicable  Cognitive:  Not Applicable  Insight:  Not Applicable  Engagement in Group: Not Applicable  Additional Comments: The patient did not attend group since he was not admitted until after the group concluded.   Kerisha Goughnour S 12/20/2016, 12:05 AM

## 2016-12-21 DIAGNOSIS — I10 Essential (primary) hypertension: Secondary | ICD-10-CM

## 2016-12-21 DIAGNOSIS — Z62810 Personal history of physical and sexual abuse in childhood: Secondary | ICD-10-CM

## 2016-12-21 DIAGNOSIS — T1491XA Suicide attempt, initial encounter: Secondary | ICD-10-CM

## 2016-12-21 DIAGNOSIS — F332 Major depressive disorder, recurrent severe without psychotic features: Principal | ICD-10-CM

## 2016-12-21 DIAGNOSIS — R450 Nervousness: Secondary | ICD-10-CM

## 2016-12-21 DIAGNOSIS — T450X2A Poisoning by antiallergic and antiemetic drugs, intentional self-harm, initial encounter: Secondary | ICD-10-CM

## 2016-12-21 DIAGNOSIS — F1020 Alcohol dependence, uncomplicated: Secondary | ICD-10-CM

## 2016-12-21 DIAGNOSIS — Z9884 Bariatric surgery status: Secondary | ICD-10-CM

## 2016-12-21 DIAGNOSIS — F4310 Post-traumatic stress disorder, unspecified: Secondary | ICD-10-CM

## 2016-12-21 DIAGNOSIS — F419 Anxiety disorder, unspecified: Secondary | ICD-10-CM

## 2016-12-21 MED ORDER — GABAPENTIN 100 MG PO CAPS
100.0000 mg | ORAL_CAPSULE | Freq: Three times a day (TID) | ORAL | Status: DC
  Administered 2016-12-21 – 2016-12-22 (×4): 100 mg via ORAL
  Filled 2016-12-21 (×9): qty 1

## 2016-12-21 MED ORDER — HYDROXYZINE HCL 25 MG PO TABS
25.0000 mg | ORAL_TABLET | Freq: Four times a day (QID) | ORAL | Status: DC | PRN
  Administered 2016-12-21 (×2): 25 mg via ORAL
  Filled 2016-12-21 (×2): qty 1

## 2016-12-21 NOTE — [facility] Group Notes (Signed)
[facility] LCSW Group Therapy Note  12/21/2016  @ 10:15 to 11:10 AM  Type of Therapy and Topic:  Group Therapy: Avoiding Self-Sabotaging and Enabling Behaviors  Participation Level:  Active   Description of Group The main focus of today's process group to discuss what "self-sabotage" means and use motivational iInterviewing to discuss what benefits, negative or positive, were involved in a self-identified self-sabotaging behavior. We then talked about reasons the patient may want to change the behavior and their current desire to change.   Summary of Patient Progress: Patient presented with appropriate affect and engaged easily in group discussion despite being called out to meet with medical staff. Patient identified negative attitudes and shame and guilt as main factors that play into his self medication.    Therapeutic molalities: Cognitive Behavioral Therapy Person-Centered Therapy Motivational Interviewing  Therapeutic Goals: 1. Patients will demonstrate understanding of the concept of self sabotage 2. Patients will be able to identify pros and cons of their behaviors 3. Patients will be able to identify at least two motivating factors for l of their desire for change   Derek Bernatherine C Harrill, LCSW

## 2016-12-21 NOTE — Progress Notes (Signed)
[facility] MD Progress Note  12/21/2016 10:26 AM Ean Gettel  MRN:  [id number] Subjective: Fayrene Fearing reports " I am feeling okay today"  Objective: Wendel Homeyer is awake, alert and oriented. Patient seen attending  groups session. Reports he is living  in a recovery house and started feeling overwhelm. Denies suicidal or homicidal ideation during this assessment . Denies auditory or visual hallucination and does not appear to be responding to internal stimuli. Reports he is hopeful to continue with his sobriety. Reports his depression 2/10. Reports increased anxiety today. Discussed using PRN medication and coping skills. Support, encouragement and reassurance was provided.    Principal Problem: Severe recurrent major depression without psychotic features (HCC) Diagnosis:   Patient Active Problem List   Diagnosis Date Noted  . Severe recurrent major depression without psychotic features (HCC) [F33.2] 12/20/2016  . Major depressive disorder, recurrent episode (HCC) [F33.9] 12/19/2016  . Alcohol use disorder, severe, dependence (HCC) [F10.20] 12/19/2016  . Essential hypertension [I10] 11/08/2015  . Obesity (BMI 30.0-34.9) [E66.9] 11/08/2015  . Sinus bradycardia [R00.1] 11/08/2015   Total Time spent with patient: 30 minutes  Past Psychiatric History:   Past Medical History:  Past Medical History:  Diagnosis Date  . Alcohol abuse   . Depression   . Essential hypertension 11/08/2015  . Hypertension   . Obesity (BMI 30.0-34.9) 11/08/2015  . Sinus bradycardia 11/08/2015    Past Surgical History:  Procedure Laterality Date  . CHOLECYSTECTOMY    . GASTRIC BYPASS     2006 - [facility] at [facility]  . ROTATOR CUFF REPAIR  2017  . ROUX-EN-Y PROCEDURE  2007  . SHOULDER ARTHROSCOPY ROTATOR CUFF DEBRIDEMENT WITH SUBACROMIAL DECOMPRESSION Right 04/17/2015   Performed by Jones Broom, MD at [facility]  . TONSILLECTOMY    . wisdom teeth extractions     Family History:  Family History  Problem  Relation Age of Onset  . Cancer Mother   . Peripheral Artery Disease Father   . Heart attack Maternal Grandfather    Family Psychiatric  History:  Social History:  Social History   Substance and Sexual Activity  Alcohol Use Yes   Comment: history of alcoholism     Social History   Substance and Sexual Activity  Drug Use No    Social History   Socioeconomic History  . Marital status: Married    Spouse name: None  . Number of children: None  . Years of education: None  . Highest education level: None  Social Needs  . Financial resource strain: None  . Food insecurity - worry: None  . Food insecurity - inability: None  . Transportation needs - medical: None  . Transportation needs - non-medical: None  Occupational History  . None  Tobacco Use  . Smoking status: Never Smoker  . Smokeless tobacco: Never Used  Substance and Sexual Activity  . Alcohol use: Yes    Comment: history of alcoholism  . Drug use: No  . Sexual activity: None  Other Topics Concern  . None  Social History Narrative  . None   Additional Social History:                         Sleep: Fair  Appetite:  Fair  Current Medications: Current Facility-Administered Medications  Medication Dose Route Frequency Provider Last Rate Last Dose  . acamprosate (CAMPRAL) tablet 666 mg  666 mg Oral TID Armandina Stammer I, NP   666 mg at 12/21/16 0810  .  acetaminophen (TYLENOL) tablet 650 mg  650 mg Oral Q6H PRN Nira ConnBerry, Jason A, NP   650 mg at 12/20/16 0020  . alum & mag hydroxide-simeth (MAALOX/MYLANTA) 200-200-20 MG/5ML suspension 30 mL  30 mL Oral Q4H PRN Nira ConnBerry, Jason A, NP      . buPROPion (WELLBUTRIN XL) 24 hr tablet 150 mg  150 mg Oral Daily Armandina StammerNwoko, Agnes I, NP   150 mg at 12/21/16 0811  . FLUoxetine (PROZAC) capsule 40 mg  40 mg Oral Daily Armandina StammerNwoko, Agnes I, NP   40 mg at 12/21/16 0810  . hydrOXYzine (ATARAX/VISTARIL) tablet 25 mg  25 mg Oral Q6H PRN Nira ConnBerry, Jason A, NP      . loperamide (IMODIUM)  capsule 2-4 mg  2-4 mg Oral PRN Nira ConnBerry, Jason A, NP      . LORazepam (ATIVAN) tablet 1 mg  1 mg Oral Q6H PRN Nira ConnBerry, Jason A, NP      . LORazepam (ATIVAN) tablet 1 mg  1 mg Oral TID Nira ConnBerry, Jason A, NP   1 mg at 12/21/16 [id number]   Followed by  . [START ON 12/22/2016] LORazepam (ATIVAN) tablet 1 mg  1 mg Oral BID Jackelyn PolingBerry, Jason A, NP       Followed by  . [START ON 12/23/2016] LORazepam (ATIVAN) tablet 1 mg  1 mg Oral Daily Nira ConnBerry, Jason A, NP      . losartan (COZAAR) tablet 25 mg  25 mg Oral Daily Armandina StammerNwoko, Agnes I, NP   25 mg at 12/21/16 0811  . magnesium hydroxide (MILK OF MAGNESIA) suspension 30 mL  30 mL Oral Daily PRN Nira ConnBerry, Jason A, NP      . multivitamin with minerals tablet 1 tablet  1 tablet Oral Daily Nira ConnBerry, Jason A, NP   1 tablet at 12/21/16 0810  . ondansetron (ZOFRAN-ODT) disintegrating tablet 4 mg  4 mg Oral Q6H PRN Nira ConnBerry, Jason A, NP      . thiamine (VITAMIN B-1) tablet 100 mg  100 mg Oral Daily Nira ConnBerry, Jason A, NP   100 mg at 12/21/16 0811  . traZODone (DESYREL) tablet 50 mg  50 mg Oral QHS,MR X 1 Nira ConnBerry, Jason A, NP   50 mg at 12/20/16 2125    Lab Results:  Results for orders placed or performed during the hospital encounter of 12/18/16 (from the past 48 hour(s))  TSH     Status: None   Collection Time: 12/19/16  3:14 PM  Result Value Ref Range   TSH 0.749 0.350 - 4.500 uIU/mL    Comment: Performed by a 3rd Generation assay with a functional sensitivity of <=0.01 uIU/mL.  T4, free     Status: None   Collection Time: 12/19/16  3:14 PM  Result Value Ref Range   Free T4 0.81 0.61 - 1.12 ng/dL    Comment: (NOTE) Biotin ingestion may interfere with free T4 tests. If the results are inconsistent with the TSH level, previous test results, or the clinical presentation, then consider biotin interference. If needed, order repeat testing after stopping biotin. Performed at [facility], [address] [place], [place] [address]   Vitamin B12     Status: None   Collection Time:  12/19/16  3:14 PM  Result Value Ref Range   Vitamin B-12 350 180 - 914 pg/mL    Comment: (NOTE) This assay is not validated for testing neonatal or myeloproliferative syndrome specimens for Vitamin B12 levels. Performed at [facility], [address] [place], [place] [address]  Blood Alcohol level:  Lab Results  Component Value Date   ETH 270 (H) 12/18/2016   ETH 125 (H) 12/15/2016    Metabolic Disorder Labs: No results found for: HGBA1C, MPG No results found for: PROLACTIN No results found for: CHOL, TRIG, HDL, CHOLHDL, VLDL, LDLCALC  Physical Findings: AIMS: Facial and Oral Movements Muscles of Facial Expression: None, normal Lips and Perioral Area: None, normal Jaw: None, normal Tongue: None, normal,Extremity Movements Upper (arms, wrists, hands, fingers): None, normal Lower (legs, knees, ankles, toes): None, normal, Trunk Movements Neck, shoulders, hips: None, normal, Overall Severity Severity of abnormal movements (highest score from questions above): None, normal Incapacitation due to abnormal movements: None, normal Patient's awareness of abnormal movements (rate only patient's report): No Awareness, Dental Status Current problems with teeth and/or dentures?: No Does patient usually wear dentures?: No  CIWA:  CIWA-Ar Total: 4 COWS:     Musculoskeletal: Strength & Muscle Tone: within normal limits Gait & Station: normal Patient leans: N/A  Psychiatric Specialty Exam: Physical Exam  Nursing note and vitals reviewed. Constitutional: He is oriented to person, place, and time.  Cardiovascular: Normal rate.  Neurological: He is alert and oriented to person, place, and time.  Psychiatric: He has a normal mood and affect. His behavior is normal.    Review of Systems  Psychiatric/Behavioral: Positive for depression (improving 2/10). Negative for suicidal ideas. The patient is nervous/anxious (ongoing).     Blood pressure (!) 145/88, pulse 66,  temperature 97.6 F (36.4 C), temperature source Oral, resp. rate 16, height 6\' 1"  (1.854 m), weight 102.1 kg (225 lb).Body mass index is 29.69 kg/m.  General Appearance: Casual  Eye Contact:  Good  Speech:  Clear and Coherent  Volume:  Normal  Mood:  Anxious and Depressed  Affect:  Congruent  Thought Process:  Coherent  Orientation:  Full (Time, Place, and Person)  Thought Content:  Hallucinations: None  Suicidal Thoughts:  No  Homicidal Thoughts:  No  Memory:  Immediate;   Fair Recent;   Fair Remote;   Fair  Judgement:  Fair  Insight:  Fair  Psychomotor Activity:  Normal  Concentration:  Concentration: Fair  Recall:  Fis[employer]Fair  Fund of Knowledge:  Fair  Language:  Good  Akathisia:  No  Handed:  Right  AIMS (if indicated):     Assets:  Communication Skills Desire for Improvement Resilience Social Support  ADL's:  Intact  Cognition:  WNL  Sleep:  Number of Hours: 6.75     Treatment Plan Summary: Daily contact with patient to assess and evaluate symptoms and progress in treatment and Medication management    Contiune with current treatment plan on 11/20/2016 except where noted  Continue Wellbutrin 150 mg and Prozac 40 mg   for mood stabilization. Start Neurtion 100 mg for mood stabilization  Continue with Trazodone 50 mg for insomnia Continue  on CWIA/ Ativan  Protocol Will continue to monitor vitals ,medication compliance and treatment side effects while patient is here.   CSW will start working on disposition.  Patient to participate in therapeutic milieu  Oneta Rackanika N Lewis, NP 12/21/2016, 10:26 AM  Agree with NP Progress Note

## 2016-12-21 NOTE — Progress Notes (Signed)
D patient is observed standing at the med window this morning for morning med pass. HE is quiet. He is depressed and has a flat affect. AHe completed his daily assessment and on this he wrote he denied havign SI within the past 24 hrs and he rated his depression, hopelessness and anxiety " 0/0/4", respectively. R Safety is in place. Cont to encourage pt participation in his recovery program.

## 2016-12-21 NOTE — Progress Notes (Signed)
D.  Pt pleasant on approach, denies complaints at this time.  Pt was positive for evening AA group, observed interacting in dayroom appropriately with peers . Pt denies SI/HI/AVH at this time.  A.  Support and encouragement offered, medications given as ordered.  R.  Pt remains safe on the unit, will continue to monitor.

## 2016-12-21 NOTE — Progress Notes (Signed)
Patient ID: Derek [name], male   DOB: 08/17/1960, 56 y.o.   MRN: [id number]  DAR Note: Pt observed in dayroom interacting with peers. Pt at the time of assessment endorsed moderate anxiety and irritability; "my wife told my sponsor somethings I didn't want him to know." Pt however; denied pain, SI, HI, or AVH. Pt was med compliant. All patient's questions and concerns addressed. Support, encouragement, and safe environment provided. 15-minute safety checks continue. Pt attended AA meeting.

## 2016-12-21 NOTE — [facility] Group Notes (Signed)
Goals Group  Date:  12/21/2016  Time:  0930  Type of Therapy:  Nurse Education  /  Goals group:  The group focuses on teaching patients how to use the acronym SMART to  Set and develop daily goals that will help them in their walk to recovery.  Participation Level:  Minimal  Participation Quality:  Attentive  Affect:  Depressed  Cognitive:  Alert  Insight:  Improving  Engagement in Group:  Engaged  Modes of Intervention:  Education  Summary of Progress/Problems:  Derek [name], [name] 12/21/2016, 12:52 PM

## 2016-12-22 MED ORDER — TRAZODONE HCL 50 MG PO TABS: 50.0000 mg | ORAL_TABLET | Freq: Every evening | ORAL | 0 refills | Status: DC | PRN

## 2016-12-22 MED ORDER — GABAPENTIN 100 MG PO CAPS: 100.0000 mg | ORAL_CAPSULE | Freq: Three times a day (TID) | ORAL | 0 refills | Status: DC

## 2016-12-22 MED ORDER — FLUOXETINE HCL 40 MG PO CAPS: 40.0000 mg | ORAL_CAPSULE | Freq: Every day | ORAL | 0 refills | Status: DC

## 2016-12-22 MED ORDER — BUPROPION HCL ER (XL) 150 MG PO TB24: 150.0000 mg | ORAL_TABLET | Freq: Every day | ORAL | 0 refills | Status: DC

## 2016-12-22 MED ORDER — ACAMPROSATE CALCIUM 333 MG PO TBEC: 666.0000 mg | DELAYED_RELEASE_TABLET | Freq: Three times a day (TID) | ORAL | 0 refills | Status: DC

## 2016-12-22 NOTE — Progress Notes (Signed)
Pt is prepared for dc as Clinical research associatewriter completed dc instructions with pt. Pt verbalized understanding and willingness to comply. Pt was given cc of all dc paperwork ( SRA, AVS, SSP and transition record).; Pt completed her daily assessment and on it he wrote he denied SI today and he rated his depression, hopelessness and anxeity " 1/0/4", respectively. `pt escorted to bldg entrance.

## 2016-12-22 NOTE — Discharge Summary (Signed)
Physician Discharge Summary Note  Patient:  Derek [name] is an 56 y.o., male MRN:  [id number] DOB:  04/24/1960 Patient phone:  [phone] (home)  Patient address:   [address] [place] [address],  Total Time spent with patient: 30 minutes  Date of Admission:  12/19/2016 Date of Discharge: 12/22/2016  Reason for Admission:  Per History of Present Illness on on 12/19/2016: This is an admission assessment for this 56 year old Caucasian male with hx of chronic alcoholism. Admitted to the [facility] adult unit from the [facility] ED with reports indicating suicide attempt by overdose on 15 capsules Benadryl 25 mg each & a fifth of liquor. Chart review cited the trigger to be patient's refusing patient to return back home after living in the an [facility] for 6 months due to alcohol use disorder.  Principal Problem: Severe recurrent major depression without psychotic features [facility]) Discharge Diagnoses: Patient Active Problem List   Diagnosis Date Noted  . Severe recurrent major depression without psychotic features (HCC) [F33.2] 12/20/2016  . Major depressive disorder, recurrent episode (HCC) [F33.9] 12/19/2016  . Alcohol use disorder, severe, dependence (HCC) [F10.20] 12/19/2016  . Essential hypertension [I10] 11/08/2015  . Obesity (BMI 30.0-34.9) [E66.9] 11/08/2015  . Sinus bradycardia [R00.1] 11/08/2015    Past Psychiatric History: Past Medical History:  Past Medical History:  Diagnosis Date  . Alcohol abuse   . Depression   . Essential hypertension 11/08/2015  . Hypertension   . Obesity (BMI 30.0-34.9) 11/08/2015  . Sinus bradycardia 11/08/2015    Past Surgical History:  Procedure Laterality Date  . CHOLECYSTECTOMY    . GASTRIC BYPASS     2006 - done at [facility]  . ROTATOR CUFF REPAIR  2017  . ROUX-EN-Y PROCEDURE  2007  . SHOULDER ARTHROSCOPY ROTATOR CUFF DEBRIDEMENT WITH SUBACROMIAL DECOMPRESSION Right 04/17/2015   Performed by Jones Broom, MD at [facility]  . TONSILLECTOMY    . wisdom teeth extractions     Family History:  Family History  Problem Relation Age of Onset  . Cancer Mother   . Peripheral Artery Disease Father   . Heart attack Maternal Grandfather    Family Psychiatric  History:  Social History:  Social History   Substance and Sexual Activity  Alcohol Use Yes   Comment: history of alcoholism     Social History   Substance and Sexual Activity  Drug Use No    Social History   Socioeconomic History  . Marital status: Married    Spouse name: None  . Number of children: None  . Years of education: None  . Highest education level: None  Social Needs  . Financial resource strain: None  . Food insecurity - worry: None  . Food insecurity - inability: None  . Transportation needs - medical: None  . Transportation needs - non-medical: None  Occupational History  . None  Tobacco Use  . Smoking status: Never Smoker  . Smokeless tobacco: Never Used  Substance and Sexual Activity  . Alcohol use: Yes    Comment: history of alcoholism  . Drug use: No  . Sexual activity: None  Other Topics Concern  . None  Social History Narrative  . None    Hospital Course:  Derek [name] was admitted for Severe recurrent major depression without psychotic features [facility])  and crisis management.  Pt was treated discharged with the medications listed below under Medication List.  Medical problems were identified and treated as needed.  Home medications were restarted as appropriate.  Improvement was monitored by observation and Derek [name] 's daily report of symptom reduction.  Emotional and mental status was monitored by daily self-inventory reports completed by Derek [name] and clinical staff.         Derek [name] was evaluated by the treatment team for stability and plans for continued recovery upon discharge. Derek [name] 's motivation was an integral factor for scheduling further treatment. Employment,  transportation, bed availability, health status, family support, and any pending legal issues were also considered during hospital stay. Pt was offered further treatment options upon discharge including but not limited to Residential, Intensive Outpatient, and Outpatient treatment.  Derek [name] will follow up with the services as listed below under Follow Up Information.     Upon completion of this admission the patient was both mentally and medically stable for discharge denying suicidal/homicidal ideation, auditory/visual/tactile hallucinations, delusional thoughts and paranoia.     Derek [name] responded well to treatment with Prozac and Wellbutrin and Neurontin.  Pt demonstrated improvement without reported or observed adverse effects to the point of stability appropriate for outpatient management. Pertinent labs include: CMP and CBC for which outpatient follow-up is necessary for lab recheck as mentioned below. Reviewed CBC, CMP, BAL 240 on admission  and UDS; all unremarkable aside from noted exceptions.   Physical Findings: AIMS: Facial and Oral Movements Muscles of Facial Expression: None, normal Lips and Perioral Area: None, normal Jaw: None, normal Tongue: None, normal,Extremity Movements Upper (arms, wrists, hands, fingers): None, normal Lower (legs, knees, ankles, toes): None, normal, Trunk Movements Neck, shoulders, hips: None, normal, Overall Severity Severity of abnormal movements (highest score from questions above): None, normal Incapacitation due to abnormal movements: None, normal Patient's awareness of abnormal movements (rate only patient's report): No Awareness, Dental Status Current problems with teeth and/or dentures?: No Does patient usually wear dentures?: No  CIWA:  CIWA-Ar Total: 0 COWS:     Musculoskeletal: Strength & Muscle Tone: within normal limits Gait & Station: normal Patient leans: N/A  Psychiatric Specialty Exam: See SRA by MD Physical Exam  ROS   Blood pressure 121/76, pulse 71, temperature 97.6 F (36.4 C), temperature source Oral, resp. rate 16, height 6\' 1"  (1.854 m), weight 102.1 kg (225 lb).Body mass index is 29.69 kg/m.    Have you used any form of tobacco in the last 30 days? (Cigarettes, Smokeless Tobacco, Cigars, and/or Pipes): No  Has this patient used any form of tobacco in the last 30 days? (Cigarettes, Smokeless Tobacco, Cigars, and/or Pipes) No  Blood Alcohol level:  Lab Results  Component Value Date   ETH 270 (H) 12/18/2016   ETH 125 (H) 12/15/2016    Metabolic Disorder Labs:  No results found for: HGBA1C, MPG No results found for: PROLACTIN No results found for: CHOL, TRIG, HDL, CHOLHDL, VLDL, LDLCALC  See Psychiatric Specialty Exam and Suicide Risk Assessment completed by Attending Physician prior to discharge.  Discharge destination:  Home  Is patient on multiple antipsychotic therapies at discharge:  No   Has Patient had three or more failed trials of antipsychotic monotherapy by history:  No  Recommended Plan for Multiple Antipsychotic Therapies: NA  Discharge Instructions    Diet - low sodium heart healthy   Complete by:  As directed    Discharge instructions   Complete by:  As directed    Treatment Plan Summary: Daily contact with patient to assess and evaluate symptoms and progress in treatment and Medication management  Take all medications as prescribed. Keep all follow-up appointments as scheduled.  Do not consume alcohol or use illegal drugs while on prescription medications. Report any adverse effects from your medications to your primary care provider promptly.  In the event of recurrent symptoms or worsening symptoms, call 911, a crisis hotline, or go to the nearest emergency department for evaluation.   Increase activity slowly   Complete by:  As directed      Allergies as of 12/22/2016   No Known Allergies     Medication List    STOP taking these medications   buPROPion  150 MG 12 hr tablet Commonly known as:  WELLBUTRIN SR Replaced by:  buPROPion 150 MG 24 hr tablet     TAKE these medications     Indication  acamprosate 333 MG tablet Commonly known as:  CAMPRAL Take 2 tablets (666 mg total) 3 (three) times daily by mouth.  Indication:  Excessive Use of Alcohol   buPROPion 150 MG 24 hr tablet Commonly known as:  WELLBUTRIN XL Take 1 tablet (150 mg total) daily by mouth. Start taking on:  12/23/2016 Replaces:  buPROPion 150 MG 12 hr tablet  Indication:  Major Depressive Disorder   FLUoxetine 40 MG capsule Commonly known as:  PROZAC Take 1 capsule (40 mg total) daily by mouth. Start taking on:  12/23/2016 What changed:    medication strength  how much to take  Indication:  Major Depressive Disorder   gabapentin 100 MG capsule Commonly known as:  NEURONTIN Take 1 capsule (100 mg total) 3 (three) times daily by mouth.  Indication:  Agitation   traZODone 50 MG tablet Commonly known as:  DESYREL Take 1 tablet (50 mg total) at bedtime and may repeat dose one time if needed by mouth.  Indication:  Drug-Induced Difficulty in Voluntary Movement      Follow-up Information    Milagros EvenerKaur, Rupinder, MD Follow up on 12/24/2016.   Specialty:  Psychiatry Why:  Hospital follow-up/medication management with Dr. Evelene CroonKaur at 4:15PM on Tuesday. Thank you.  Contact information: [address] [address] [place] [address] [phone]        Gates RiggMatt Sandifer ([facility]) Follow up on 12/25/2016.   Why:  Therapy appt on Wednesday at 3:00PM with Matt. Thank you.  Contact information: [address] [place], [place] [address] Phone: [phone] Fax: [phone]          Follow-up recommendations:  Activity:  as tolerated Diet:  heart healthy  Comments: Take all medications as prescribed. Keep all follow-up appointments as scheduled.  Do not consume alcohol or use illegal drugs while on prescription medications. Report any  adverse effects from your medications to your primary care provider promptly.  In the event of recurrent symptoms or worsening symptoms, call 911, a crisis hotline, or go to the nearest emergency department for evaluation.   Signed: Oneta Rackanika N Lewis, NP 12/22/2016, 10:01 AM   Patient was seen face to face for psychiatric evaluation, suicide risk assessment and case discussed with treatment team and NP and made appropriate disposition plans. Reviewed the information documented and agree with the treatment plan.    Jomarie LongsSaramma Sharnise Blough ,MD [facility]

## 2016-12-22 NOTE — [facility] Suicide Risk Assessment (Signed)
[facility]   Principal Problem: Severe recurrent major depression without psychotic features [facility](HCC) Discharge Diagnoses:  Patient Active Problem List   Diagnosis Date Noted  . Severe recurrent major depression without psychotic features (HCC) [F33.2] 12/20/2016  . Major depressive disorder, recurrent episode (HCC) [F33.9] 12/19/2016  . Alcohol use disorder, severe, dependence (HCC) [F10.20] 12/19/2016  . Essential hypertension [I10] 11/08/2015  . Obesity (BMI 30.0-34.9) [E66.9] 11/08/2015  . Sinus bradycardia [R00.1] 11/08/2015    Total Time spent with patient: 30 minutes  Musculoskeletal: Strength & Muscle Tone: within normal limits Gait & Station: normal Patient leans: N/A  Psychiatric Specialty Exam: Review of Systems  Psychiatric/Behavioral: Positive for substance abuse. Negative for depression, hallucinations and suicidal ideas. The patient is not nervous/anxious.   All other systems reviewed and are negative.   Blood pressure 121/76, pulse 71, temperature 97.6 F (36.4 C), temperature source Oral, resp. rate 16, height 6\' 1"  (1.854 m), weight 102.1 kg (225 lb).Body mass index is 29.69 kg/m.  General Appearance: Casual  Eye Contact::  Fair  Speech:  Clear and Coherent409  Volume:  Normal  Mood:  Euthymic  Affect:  Appropriate  Thought Process:  Goal Directed and Descriptions of Associations: Intact  Orientation:  Full (Time, Place, and Person)  Thought Content:  Logical  Suicidal Thoughts:  No  Homicidal Thoughts:  No  Memory:  Immediate;   Fair Recent;   Fair Remote;   Fair  Judgement:  Fair  Insight:  Fair  Psychomotor Activity:  Normal  Concentration:  Fair  Recall:  FiservFair  Fund of Knowledge:Fair  Language: Fair  Akathisia:  No  Handed:  Right  AIMS (if indicated):     Assets:  Communication Skills Desire for Improvement Housing Physical Health Social Support Transportation  Sleep:  Number of Hours: 6.5  Cognition: WNL   ADL's:  Intact   Mental Status Per Nursing Assessment::   On Admission:     Demographic Factors:  Male and Caucasian  Loss Factors: NA  Historical Factors: Impulsivity  Risk Reduction Factors:   Positive social support and Positive therapeutic relationship  Continued Clinical Symptoms:  Alcohol/Substance Abuse/Dependencies  Cognitive Features That Contribute To Risk:  None    Suicide Risk:  Minimal: No identifiable suicidal ideation.  Patients presenting with no risk factors but with morbid ruminations; may be classified as minimal risk based on the severity of the depressive symptoms  Follow-up Information    Milagros EvenerKaur, Rupinder, MD Follow up on 12/24/2016.   Specialty:  Psychiatry Why:  Hospital follow-up/medication management with Dr. Evelene CroonKaur at 4:15PM on Tuesday. Thank you.  Contact information: [address] [address] [place] [address] [phone]        Gates RiggMatt Sandifer ([facility]) Follow up on 12/25/2016.   Why:  Therapy appt on Wednesday at 3:00PM with Matt. Thank you.  Contact information: [address] [place], [place] [address] Phone: [phone] Fax: [phone]          Plan Of Care/Follow-up recommendations:  Activity:  no restrictions Diet:  regular Tests:  as needed Other:  follow up with aftercare  Jomarie LongsSaramma Gurfateh Mcclain, MD 12/22/2016, 9:43 AM

## 2017-03-20 ENCOUNTER — Other Ambulatory Visit: Payer: Self-pay

## 2017-03-20 ENCOUNTER — Encounter (HOSPITAL_COMMUNITY): Payer: Self-pay | Admitting: Emergency Medicine

## 2017-03-20 ENCOUNTER — Emergency Department (HOSPITAL_COMMUNITY)
Admission: EM | Admit: 2017-03-20 | Discharge: 2017-03-20 | Disposition: A | Discharge status: Home / Self Care | Payer: 59 | Attending: Emergency Medicine | Admitting: Emergency Medicine

## 2017-03-20 DIAGNOSIS — F10230 Alcohol dependence with withdrawal, uncomplicated: Secondary | ICD-10-CM | POA: Insufficient documentation

## 2017-03-20 DIAGNOSIS — Z79899 Other long term (current) drug therapy: Secondary | ICD-10-CM | POA: Diagnosis not present

## 2017-03-20 DIAGNOSIS — F1099 Alcohol use, unspecified with unspecified alcohol-induced disorder: Secondary | ICD-10-CM | POA: Diagnosis present

## 2017-03-20 DIAGNOSIS — I10 Essential (primary) hypertension: Secondary | ICD-10-CM | POA: Insufficient documentation

## 2017-03-20 LAB — BASIC METABOLIC PANEL
Anion gap: 19 — ABNORMAL HIGH (ref 5–15)
BUN: 13 mg/dL (ref 6–20)
CO2: 19 mmol/L — ABNORMAL LOW (ref 22–32)
CREATININE: 1.14 mg/dL (ref 0.61–1.24)
Calcium: 8.9 mg/dL (ref 8.9–10.3)
Chloride: 98 mmol/L — ABNORMAL LOW (ref 101–111)
GFR calc Af Amer: >60 mL/min (ref >60)
GFR calc non Af Amer: >60 mL/min (ref >60)
GLUCOSE: 120 mg/dL — AB (ref 65–99)
POTASSIUM: 4.2 mmol/L (ref 3.5–5.1)
SODIUM: 136 mmol/L (ref 135–145)

## 2017-03-20 LAB — CBC WITH DIFFERENTIAL/PLATELET
BASOS ABS: 0.1 10*3/uL (ref 0.0–0.1)
Basophils Relative: 1 %
EOS ABS: 0 10*3/uL (ref 0.0–0.7)
EOS PCT: 0 %
HCT: 42.5 % (ref 39.0–52.0)
Hemoglobin: 15.1 g/dL (ref 13.0–17.0)
LYMPHS ABS: 1.8 10*3/uL (ref 0.7–4.0)
LYMPHS PCT: 15 %
MCH: 28.9 pg (ref 26.0–34.0)
MCHC: 35.5 g/dL (ref 30.0–36.0)
MCV: 81.3 fL (ref 78.0–100.0)
MONO ABS: 1.2 10*3/uL — AB (ref 0.1–1.0)
Monocytes Relative: 10 %
Neutro Abs: 9.2 10*3/uL — ABNORMAL HIGH (ref 1.7–7.7)
Neutrophils Relative %: 74 %
PLATELETS: 261 10*3/uL (ref 150–400)
RBC: 5.23 MIL/uL (ref 4.22–5.81)
RDW: 13.2 % (ref 11.5–15.5)
WBC: 12.3 10*3/uL — AB (ref 4.0–10.5)

## 2017-03-20 MED ORDER — ONDANSETRON HCL 4 MG/2ML IJ SOLN
4.0000 mg | Freq: Once | INTRAMUSCULAR | Status: AC
  Administered 2017-03-20: 4 mg via INTRAVENOUS
  Filled 2017-03-20: qty 2

## 2017-03-20 MED ORDER — DIAZEPAM 5 MG PO TABS
10.0000 mg | ORAL_TABLET | Freq: Once | ORAL | Status: AC
  Administered 2017-03-20: 10 mg via ORAL
  Filled 2017-03-20: qty 2

## 2017-03-20 MED ORDER — ONDANSETRON 4 MG PO TBDP
4.0000 mg | ORAL_TABLET | Freq: Once | ORAL | Status: AC
  Administered 2017-03-20: 4 mg via ORAL
  Filled 2017-03-20: qty 1

## 2017-03-20 MED ORDER — SODIUM CHLORIDE 0.9 % IV BOLUS (SEPSIS)
1000.0000 mL | Freq: Once | INTRAVENOUS | Status: AC
  Administered 2017-03-20: 1000 mL via INTRAVENOUS

## 2017-03-20 MED ORDER — CHLORDIAZEPOXIDE HCL 25 MG PO CAPS: ORAL_CAPSULE | ORAL | 0 refills | Status: DC

## 2017-03-20 NOTE — Discharge Instructions (Addendum)
Substance Abuse Treatment Programs  Intensive Outpatient Programs [facility]     [address]      [place], [place]                   [phone]       [facility] [address] [place], [place] [phone]  [facility]     (Inpatient and outpatient)     [address]           [phone]    [facility] [phone] (Suboxone and Methadone)  [address]      [place], [place] [address]      [phone]       [address] [place], [place] [phone]  [facility] (Outpatient/Inpatient, Chemical)    (insurance only) [phone]             [facility] (Groups & Residential) [place], [place] [phone]     [facility]     [address]     [place], [place]      [phone]       [facility] (for caregivers and family) [phone] [address] [place], [place] [phone]      Residential Treatment Programs [facility]      [address], [place], [place] [address]  [phone]       [facility] [address] [place], [place] [address] [phone]  [facility]        [phone]       [facility] [phone]  [facility] ([facility]             [address]                                         [place], [place]                                                [phone] or [phone]                               [facility] [place] [address] [place], [address] [phone]  [facility]    [address]      [place], [place]     [phone]       [facility] [address] [place], [place] [phone]  [facility]   [address]     [place], [place] [address]     [phone]      Admissions: 8am-3pm M-F  [facility] ([facility]) [address] [place],  [place] [phone]  [facility] Program: Residential Program [phone](90 Days)   [place], [place]      [phone] or [phone]     [facility]: [facility] [place], [place] (Walk in Hours over the weekend or by referral)  [facility] [address] [place], [place], [place] [address] [phone]  Crisis Mobile: [facility]:  [phone] (for crisis response 24 hours a day) [facility] Hotline:      [phone] Outpatient Psychiatry and Counseling  [facility]:  Mobile Crisis Management 24 hours:  7744635923  [facility] of the [employer] sliding scale fee and walk in schedule: M-F 8am-12pm/1pm-3pm [address]  [place], [place] [address] [phone]  [facility] [address] [place], [place] [address] [phone]  [facility] (Formerly known as [facility] [employer])- new patient walk-in appointments available Monday - Friday 8am -3pm.          [address] [place], [place] [address] [phone] or crisis line- [phone]  [facility] Out[facility] Services/ Intensive Outpatient Therapy Program [address] [place], [place] [address] [phone]  [facility]                  Crisis Services      [phone] [address]     [place], [place] [address]                 [facility]   [facility] [phone]. [address] [place], [place] [address]   [employer] of Care          [address]  [place], [place] [address]       [phone]  [facility] [address] [place], [place] [address] [phone]  [facility]    [address]    [place], [place] [address]     [phone]       Andee Poles, MD     [address]     [place] [address]     [phone]       [facility] [address] [place] [address]  [facility]     [address] [place], [place]      [phone]       [facility] Eulogio Ditch, MD [address] [place], [place] [address] [phone]  [facility]     [address]     [place], [place] [address]     [phone]       [facility] [address] [place], [place] [address] [phone] Resources for Temporary Residential Assistance/Crisis Centers  DAY CENTERS [facility]) M-F 8am-3pm   [address] [place], [place] [address]   [phone] Services include: laundry, barbering, support groups, case management, phone  & computer access, showers, AA/NA mtgs, mental health/substance abuse nurse, job skills class, disability information, VA assistance, spiritual classes, etc.   HOMELESS SHELTERS  [facility]     [facility]   [address], [place]     [phone]              [employer] (women and children)       [address] [place], [place] [address] [phone] [email or web address] for application and process Application Required  [facility]   [address]    [place] [address]     [phone]                    [facility] [address] [place], [place] [address] [phone] application appt.) Application Required  [facility] (women only)    [address]     [place], [place] [address]     [phone]  Intake starts 6pm daily Need valid ID, SSC, & Police report [employer] [address] [place], [place] [phone] Application Required  [employer] (men only)     [address]      [place], [place]     [phone]       [facility] [place] [facility] [place] (Pregnant women only) [address] [place], [place] [phone]  [facility]      [address]      [place], [place] [address]     [phone]             [facility] [address] [place], [place] [phone] 90 day commitment/SA/Application process  [facility](men only)     [address]     [place], [place]     [phone]       Check-in at [facility] [address] [place], [place] [address] [phone] Men/Women/Women and Children must be there by 7 pm  [facility] [place], [place] [phone]    Your evaluated in the emergency department for alcohol dependence and withdrawal.  You were given some medication to hydrate you and some medicine to help with withdrawal symptoms.  We are prescribing you Librium to help you with withdrawal.  You should not drink alcohol when you are taking this medication.  We recommend that you consider detox and have provided you with some names and numbers.  Please return to the emergency department if any worsening symptoms.

## 2017-03-20 NOTE — ED Notes (Signed)
Patient was kept until he could stop shaking and able to calm down.

## 2017-03-20 NOTE — ED Notes (Signed)
Adds he has hemorrhoids and noticed blood when wiping the past couple days.

## 2017-03-20 NOTE — ED Notes (Signed)
Bed: [facility]D Expected date:  Expected time:  Means of arrival:  Comments: 

## 2017-03-20 NOTE — ED Triage Notes (Signed)
Requesting detox from ETOH last drink was around noon today. Reports nausea. Been drinking several cases of beer over the past couple days. Is currently living in a halfway house.

## 2017-03-20 NOTE — Patient Outreach (Signed)
CPSS  and Derek [name] met with the patient and provided substance use recovery support. CPSS met with the patient previously on 12/22/16. Patient has been to multiple substance use inpatient treatment centers [facility]. Patient currently has a sponsor and is involved with AA. CPSS will continue to follow up with the patient and help him gain access to substance use treatment. CPSS also provided CPSS contact information. CPSS encouraged the patient to contact CPSS at anytime for substance use recovery support or help with substance use treatment resources.  

## 2017-03-20 NOTE — ED Provider Notes (Signed)
[facility]-EMERGENCY DEPT Provider Note   CSN: [id number] Arrival date & time: 03/20/17  1301     History   Chief Complaint Chief Complaint  Patient presents with  . detox    HPI Derek [name] is a 57 y.o. male. 57 year old male with history of alcohol dependence here after being kicked out of his halfway house.  He states for the last 4 days he has been drinking 3-4 cases of beer a day.  He states he is looking for detox and does not feel well.  States he felt like he was in a pass out today.  He is also been complaining of some bright red blood per rectum when wiping after bowel movements and has a history of hemorrhoids.  He denies any chest pain or shortness of breath.  There is no suicidal ideations.  He states currently he is living in his car.  The history is provided by the patient.  Alcohol Problem  This is a recurrent problem. The current episode started more than 2 days ago. The problem has been gradually worsening. Pertinent negatives include no chest pain, no abdominal pain, no headaches and no shortness of breath. The symptoms are aggravated by drinking. Nothing relieves the symptoms. He has tried nothing for the symptoms.    Past Medical History:  Diagnosis Date  . Alcohol abuse   . Depression   . Essential hypertension 11/08/2015  . Hypertension   . Obesity (BMI 30.0-34.9) 11/08/2015  . Sinus bradycardia 11/08/2015    Patient Active Problem List   Diagnosis Date Noted  . 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 Major depressive disorder, recurrent episode (HCC) 12/19/2016  . Alcohol use disorder, severe, dependence (HCC) 12/19/2016  . Essential hypertension 11/08/2015  . Obesity (BMI 30.0-34.9) 11/08/2015  . Sinus bradycardia 11/08/2015    Past Surgical History:  Procedure Laterality Date  . CHOLECYSTECTOMY    . [facility]PASS     [facility]  . ROTATOR CUFF REPAIR  2017  . ROUX-EN-Y  PROCEDURE  2007  . SHOULDER ARTHROSCOPY WITH SUBACROMIAL DECOMPRESSION Right 04/17/2015   Procedure: SHOULDER ARTHROSCOPY ROTATOR CUFF DEBRIDEMENT WITH SUBACROMIAL DECOMPRESSION;  Surgeon: Jones BroomJustin Chandler, MD;  Location: [facility];  Service: Orthopedics;  Laterality: Right;  . TONSILLECTOMY    . wisdom teeth extractions         Home Medications    Prior to Admission medications   Medication Sig Start Date End Date Taking? Authorizing Provider  acamprosate (CAMPRAL) 333 MG tablet Take 2 tablets (666 mg total) 3 (three) times daily by mouth. 12/22/16   Oneta RackLewis, Tanika N, NP  buPROPion (WELLBUTRIN XL) 150 MG 24 hr tablet Take 1 tablet (150 mg total) daily by mouth. 12/23/16   Oneta RackLewis, Tanika N, NP  FLUoxetine (PROZAC) 40 MG capsule Take 1 capsule (40 mg total) daily by mouth. 12/23/16   Oneta RackLewis, Tanika N, NP  gabapentin (NEURONTIN) 100 MG capsule Take 1 capsule (100 mg total) 3 (three) times daily by mouth. 12/22/16   Oneta RackLewis, Tanika N, NP  traZODone (DESYREL) 50 MG tablet Take 1 tablet (50 mg total) at bedtime and may repeat dose one time if needed by mouth. 12/22/16   Oneta RackLewis, Tanika N, NP    Family History Family History  Problem Relation Age of Onset  . Cancer Mother   . Peripheral Artery Disease Father   . Heart attack Maternal Grandfather     Social History Social History  Tobacco Use  . Smoking status: Never Smoker  . Smokeless tobacco: Never Used  Substance Use Topics  . Alcohol use: Yes  . Drug use: No     Allergies   Patient has no known allergies.   Review of Systems Review of Systems  Constitutional: Negative for chills and fever.  HENT: Negative for ear pain and sore throat.   Eyes: Negative for pain and visual disturbance.  Respiratory: Negative for cough and shortness of breath.   Cardiovascular: Negative for chest pain and palpitations.  Gastrointestinal: Positive for blood in stool. Negative for abdominal pain and vomiting.  Genitourinary:  Negative for dysuria and hematuria.  Musculoskeletal: Negative for arthralgias and back pain.  Skin: Negative for color change and rash.  Neurological: Negative for seizures, syncope and headaches.  All other systems reviewed and are negative.    Physical Exam Updated Vital Signs BP (!) 185/106 (BP Location: Left Arm)   Pulse 98   Temp 98.6 F (37 C) (Oral)   Resp 20   Ht 6\' 1"  (1.854 m)   Wt 102.1 kg (225 lb)   SpO2 98%   BMI 29.69 kg/m   Physical Exam  Constitutional: He appears well-developed and well-nourished.  HENT:  Head: Normocephalic and atraumatic.  Eyes: Conjunctivae are normal.  Neck: Neck supple.  Cardiovascular: Normal rate and regular rhythm.  No murmur heard. Pulmonary/Chest: Effort normal and breath sounds normal. No respiratory distress.  Abdominal: Soft. There is no tenderness.  Musculoskeletal: He exhibits no edema or tenderness.  Neurological: He is alert.  Skin: Skin is warm and dry.  Psychiatric: He has a normal mood and affect.  Nursing note and vitals reviewed.    ED Treatments / Results  Labs (all labs ordered are listed, but only abnormal results are displayed) Labs Reviewed  BASIC METABOLIC PANEL - Abnormal; Notable for the following components:      Result Value   Chloride 98 (*)    CO2 19 (*)    Glucose, Bld 120 (*)    Anion gap 19 (*)    All other components within normal limits  CBC WITH DIFFERENTIAL/PLATELET - Abnormal; Notable for the following components:   WBC 12.3 (*)    Neutro Abs 9.2 (*)    Monocytes Absolute 1.2 (*)    All other components within normal limits    EKG  EKG Interpretation None       Radiology No results found.  Procedures Procedures (including critical care time)  Medications Ordered in ED Medications  diazepam (VALIUM) tablet 10 mg (not administered)  ondansetron (ZOFRAN-ODT) disintegrating tablet 4 mg (4 mg Oral Given 03/20/17 1457)     Initial Impression / Assessment and Plan / ED  Course  I have reviewed the triage vital signs and the nursing notes.  Pertinent labs & imaging results that were available during my care of the patient were reviewed by me and considered in my medical decision making (see chart for details).  Clinical Course as of Mar 22 1149  Thu Mar 20, 2017  1900 Other than hypertension patient is not exhibiting any significant withdrawal symptoms.  We are getting some fluids to hydrate him and gave him some oral benzodiazepine.  Likely he will be discharged and given resources for detox and support services and a Librium taper  [MB]    Clinical Course User Index [MB] Terrilee Files, MD      Final Clinical Impressions(s) / ED Diagnoses   Final diagnoses:  Alcohol dependence  with withdrawal, uncomplicated [facility])    ED Discharge Orders        Ordered    chlordiazePOXIDE (LIBRIUM) 25 MG capsule     03/20/17 2133       Terrilee Files, MD 03/22/17 1152

## 2017-03-31 DIAGNOSIS — J069 Acute upper respiratory infection, unspecified: Secondary | ICD-10-CM | POA: Diagnosis not present

## 2017-05-18 ENCOUNTER — Emergency Department (HOSPITAL_COMMUNITY)
Admission: EM | Admit: 2017-05-18 | Discharge: 2017-05-18 | Disposition: A | Discharge status: Home / Self Care | Payer: 59 | Attending: Emergency Medicine | Admitting: Emergency Medicine

## 2017-05-18 DIAGNOSIS — F10929 Alcohol use, unspecified with intoxication, unspecified: Secondary | ICD-10-CM | POA: Insufficient documentation

## 2017-05-18 DIAGNOSIS — Z5321 Procedure and treatment not carried out due to patient leaving prior to being seen by health care provider: Secondary | ICD-10-CM | POA: Insufficient documentation

## 2017-05-26 ENCOUNTER — Emergency Department (HOSPITAL_COMMUNITY): Admission: EM | Admit: 2017-05-26 | Discharge: 2017-05-26 | Discharge status: Against Medical Advice | Payer: 59

## 2017-05-26 NOTE — ED Notes (Signed)
No answer x1 for triage. Moved back to waiting room.

## 2017-05-27 DIAGNOSIS — M545 Low back pain: Secondary | ICD-10-CM | POA: Diagnosis not present

## 2017-05-29 ENCOUNTER — Emergency Department (HOSPITAL_COMMUNITY)
Admission: EM | Admit: 2017-05-29 | Discharge: 2017-05-30 | Disposition: A | Discharge status: Home / Self Care | Payer: 59 | Attending: Emergency Medicine | Admitting: Emergency Medicine

## 2017-05-29 ENCOUNTER — Encounter (HOSPITAL_COMMUNITY): Payer: Self-pay | Admitting: Emergency Medicine

## 2017-05-29 DIAGNOSIS — F419 Anxiety disorder, unspecified: Secondary | ICD-10-CM | POA: Diagnosis not present

## 2017-05-29 DIAGNOSIS — T7421XA Adult sexual abuse, confirmed, initial encounter: Secondary | ICD-10-CM | POA: Diagnosis not present

## 2017-05-29 DIAGNOSIS — Z046 Encounter for general psychiatric examination, requested by authority: Secondary | ICD-10-CM | POA: Diagnosis not present

## 2017-05-29 DIAGNOSIS — Z79899 Other long term (current) drug therapy: Secondary | ICD-10-CM | POA: Insufficient documentation

## 2017-05-29 DIAGNOSIS — F1010 Alcohol abuse, uncomplicated: Secondary | ICD-10-CM | POA: Insufficient documentation

## 2017-05-29 DIAGNOSIS — F329 Major depressive disorder, single episode, unspecified: Secondary | ICD-10-CM | POA: Diagnosis present

## 2017-05-29 DIAGNOSIS — I10 Essential (primary) hypertension: Secondary | ICD-10-CM | POA: Insufficient documentation

## 2017-05-29 DIAGNOSIS — F1020 Alcohol dependence, uncomplicated: Secondary | ICD-10-CM | POA: Diagnosis present

## 2017-05-29 LAB — COMPREHENSIVE METABOLIC PANEL
ALT: 37 U/L (ref 17–63)
AST: 57 U/L — ABNORMAL HIGH (ref 15–41)
Albumin: 3.7 g/dL (ref 3.5–5.0)
Alkaline Phosphatase: 95 U/L (ref 38–126)
Anion gap: 15 (ref 5–15)
BUN: 17 mg/dL (ref 6–20)
CALCIUM: 8.5 mg/dL — AB (ref 8.9–10.3)
CO2: 21 mmol/L — AB (ref 22–32)
Chloride: 105 mmol/L (ref 101–111)
Creatinine, Ser: 1.09 mg/dL (ref 0.61–1.24)
Glucose, Bld: 102 mg/dL — ABNORMAL HIGH (ref 65–99)
Potassium: 3.9 mmol/L (ref 3.5–5.1)
SODIUM: 141 mmol/L (ref 135–145)
Total Bilirubin: 0.8 mg/dL (ref 0.3–1.2)
Total Protein: 6.6 g/dL (ref 6.5–8.1)

## 2017-05-29 LAB — CBC WITH DIFFERENTIAL/PLATELET
BASOS ABS: 0.1 10*3/uL (ref 0.0–0.1)
Basophils Relative: 1 %
EOS ABS: 0.1 10*3/uL (ref 0.0–0.7)
Eosinophils Relative: 1 %
HCT: 40.1 % (ref 39.0–52.0)
Hemoglobin: 13.4 g/dL (ref 13.0–17.0)
LYMPHS ABS: 2.4 10*3/uL (ref 0.7–4.0)
LYMPHS PCT: 32 %
MCH: 28.7 pg (ref 26.0–34.0)
MCHC: 33.4 g/dL (ref 30.0–36.0)
MCV: 85.9 fL (ref 78.0–100.0)
Monocytes Absolute: 0.6 10*3/uL (ref 0.1–1.0)
Monocytes Relative: 8 %
Neutro Abs: 4.3 10*3/uL (ref 1.7–7.7)
Neutrophils Relative %: 58 %
PLATELETS: 286 10*3/uL (ref 150–400)
RBC: 4.67 MIL/uL (ref 4.22–5.81)
RDW: 14.5 % (ref 11.5–15.5)
WBC: 7.5 10*3/uL (ref 4.0–10.5)

## 2017-05-29 LAB — RAPID HIV SCREEN (HIV 1/2 AB+AG)
HIV 1/2 Antibodies: NONREACTIVE
HIV-1 P24 Antigen - HIV24: NONREACTIVE

## 2017-05-29 LAB — ETHANOL: ALCOHOL ETHYL (B): 218 mg/dL — AB (ref <10)

## 2017-05-29 MED ORDER — LORAZEPAM 1 MG PO TABS
0.0000 mg | ORAL_TABLET | Freq: Four times a day (QID) | ORAL | Status: DC
  Administered 2017-05-29 – 2017-05-30 (×2): 2 mg via ORAL
  Filled 2017-05-29 (×2): qty 2

## 2017-05-29 MED ORDER — ELVITEG-COBIC-EMTRICIT-TENOFAF 150-150-200-10 MG PO TABS: 1.0000 | ORAL_TABLET | Freq: Every day | ORAL | 0 refills | Status: DC

## 2017-05-29 MED ORDER — ELVITEG-COBIC-EMTRICIT-TENOFAF 150-150-200-10 MG PO TABS
1.0000 | ORAL_TABLET | Freq: Every day | ORAL | Status: DC
  Administered 2017-05-29 – 2017-05-30 (×2): 1 via ORAL
  Filled 2017-05-29: qty 5

## 2017-05-29 MED ORDER — LORAZEPAM 2 MG/ML IJ SOLN: 0.0000 mg | Freq: Four times a day (QID) | INTRAMUSCULAR | Status: DC

## 2017-05-29 MED ORDER — LORAZEPAM 2 MG/ML IJ SOLN: 0.0000 mg | Freq: Two times a day (BID) | INTRAMUSCULAR | Status: DC

## 2017-05-29 MED ORDER — LORAZEPAM 1 MG PO TABS
1.0000 mg | ORAL_TABLET | Freq: Once | ORAL | Status: AC
  Administered 2017-05-29: 1 mg via ORAL
  Filled 2017-05-29: qty 1

## 2017-05-29 MED ORDER — VITAMIN B-1 100 MG PO TABS
100.0000 mg | ORAL_TABLET | Freq: Every day | ORAL | Status: DC
  Administered 2017-05-30: 100 mg via ORAL
  Filled 2017-05-29 (×2): qty 1

## 2017-05-29 MED ORDER — LORAZEPAM 1 MG PO TABS: 0.0000 mg | ORAL_TABLET | Freq: Two times a day (BID) | ORAL | Status: DC

## 2017-05-29 MED ORDER — THIAMINE HCL 100 MG/ML IJ SOLN: 100.0000 mg | Freq: Every day | INTRAMUSCULAR | Status: DC

## 2017-05-29 NOTE — ED Notes (Signed)
Pt is anxious, pt wife stated that pt is getting worse, pt is fixating on a broken clock in room. Pt was reoriented to reality. Dr, Rubin PayorPickering made aware.

## 2017-05-29 NOTE — ED Notes (Signed)
Patient given scrubs to change
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
## 2017-05-29 NOTE — ED Notes (Signed)
Made Dr Rubin PayorPickering aware of pt complaint. Was instructed that patient will need to be moved to treatment room.

## 2017-05-29 NOTE — ED Notes (Signed)
Pt states that he has had 1/5th of alcohol to drink today, and about 12 12oz cans of beer, as well as 1 pint of whiskey.

## 2017-05-29 NOTE — ED Notes (Signed)
Bed: WA27 Expected date:  Expected time:  Means of arrival:  Comments: Triage 3 
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
## 2017-05-30 LAB — RAPID URINE DRUG SCREEN, HOSP PERFORMED
Amphetamines: NOT DETECTED
BARBITURATES: NOT DETECTED
Benzodiazepines: POSITIVE — AB
Cocaine: NOT DETECTED
OPIATES: NOT DETECTED
TETRAHYDROCANNABINOL: NOT DETECTED

## 2017-05-30 LAB — RPR: RPR Ser Ql: NONREACTIVE

## 2017-05-30 MED ORDER — FLUOXETINE HCL 20 MG PO CAPS
40.0000 mg | ORAL_CAPSULE | Freq: Every day | ORAL | Status: DC
  Administered 2017-05-30: 40 mg via ORAL
  Filled 2017-05-30: qty 2

## 2017-05-30 MED ORDER — GABAPENTIN 100 MG PO CAPS
100.0000 mg | ORAL_CAPSULE | Freq: Three times a day (TID) | ORAL | Status: DC
  Administered 2017-05-30: 100 mg via ORAL
  Filled 2017-05-30: qty 1

## 2017-05-30 MED ORDER — BUPROPION HCL ER (XL) 150 MG PO TB24
150.0000 mg | ORAL_TABLET | Freq: Every day | ORAL | Status: DC
  Administered 2017-05-30: 150 mg via ORAL
  Filled 2017-05-30: qty 1

## 2017-05-30 NOTE — ED Notes (Signed)
Report given to RN

## 2017-05-30 NOTE — BH Assessment (Signed)
[facility]  Per Juanetta BeetsJacqueline Norman, DO, this pt does not require psychiatric hospitalization at this time.  [facility] to continue treatment with his current outpatient providers, Milagros Evenerupinder Kaur, MD, and [facility], LCSW.  This has been included in pt's discharge instructions.  Pt would also benefit from seeing Peer Support Specialists; they will be asked to speak to pt.  Pt's nurse, Donnal DebarRandi, has been notified.  Doylene Canninghomas Carvel Huskins, MA Triage Specialist [phone]

## 2017-05-30 NOTE — Patient Outreach (Signed)
CPSS met with the patient and provided substance use recovery support. CPSS Aaron Edelman DeGraphenreid and Mason Jim utilized motivational interviewing skills to highlight patient strengths and help him identify his recovery supports. Patient is interested in longer-term residential treatment. Patient has been in contact with 18-24 month substance use treatment program in [place], [place]. Patient has a desire to not be in substance use treatment [facility] due to risk of relapse. CPSS also talked to the patient about [facility] which help place patients at different treatment programs across several states. CPSS has met with the patient on 12/19/16 and 03/20/17 as well.  CPSS also provided CPSS contact information and highly encouraged the patient to stay in contact CPSS for substance use recovery support or further help with recovery resources.
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
## 2017-05-30 NOTE — Discharge Instructions (Signed)
For your behavioral health needs, you are advised to continue with your current outpatient providers:       Milagros Evenerupinder Kaur, MD      [address]      [place], [place] [address]      [phone]       Carlynn HeraldMatt Sandier, LCSW      [facility]      [address]      [place], [place] [address]      [phone]

## 2017-05-31 LAB — HEPATITIS C ANTIBODY

## 2017-05-31 LAB — HEPATITIS B SURFACE ANTIGEN: HEP B S AG: NEGATIVE

## 2017-11-11 ENCOUNTER — Encounter (HOSPITAL_COMMUNITY): Payer: Self-pay | Admitting: Emergency Medicine

## 2017-11-11 ENCOUNTER — Other Ambulatory Visit: Payer: Self-pay

## 2017-11-11 ENCOUNTER — Emergency Department (HOSPITAL_COMMUNITY): Payer: 59

## 2017-11-11 ENCOUNTER — Emergency Department (HOSPITAL_COMMUNITY)
Admission: EM | Admit: 2017-11-11 | Discharge: 2017-11-12 | Disposition: A | Discharge status: Home / Self Care | Payer: 59 | Attending: Emergency Medicine | Admitting: Emergency Medicine

## 2017-11-11 DIAGNOSIS — S00412A Abrasion of left ear, initial encounter: Secondary | ICD-10-CM | POA: Insufficient documentation

## 2017-11-11 DIAGNOSIS — Z9884 Bariatric surgery status: Secondary | ICD-10-CM | POA: Diagnosis not present

## 2017-11-11 DIAGNOSIS — Y999 Unspecified external cause status: Secondary | ICD-10-CM | POA: Diagnosis not present

## 2017-11-11 DIAGNOSIS — X58XXXA Exposure to other specified factors, initial encounter: Secondary | ICD-10-CM | POA: Diagnosis not present

## 2017-11-11 DIAGNOSIS — F10220 Alcohol dependence with intoxication, uncomplicated: Secondary | ICD-10-CM | POA: Diagnosis not present

## 2017-11-11 DIAGNOSIS — Z79899 Other long term (current) drug therapy: Secondary | ICD-10-CM | POA: Insufficient documentation

## 2017-11-11 DIAGNOSIS — R270 Ataxia, unspecified: Secondary | ICD-10-CM | POA: Diagnosis not present

## 2017-11-11 DIAGNOSIS — I10 Essential (primary) hypertension: Secondary | ICD-10-CM | POA: Diagnosis not present

## 2017-11-11 DIAGNOSIS — Y9384 Activity, sleeping: Secondary | ICD-10-CM | POA: Insufficient documentation

## 2017-11-11 DIAGNOSIS — Y908 Blood alcohol level of 240 mg/100 ml or more: Secondary | ICD-10-CM | POA: Diagnosis not present

## 2017-11-11 DIAGNOSIS — Y9289 Other specified places as the place of occurrence of the external cause: Secondary | ICD-10-CM | POA: Insufficient documentation

## 2017-11-11 DIAGNOSIS — S00402A Unspecified superficial injury of left ear, initial encounter: Secondary | ICD-10-CM | POA: Diagnosis present

## 2017-11-11 DIAGNOSIS — F10920 Alcohol use, unspecified with intoxication, uncomplicated: Secondary | ICD-10-CM

## 2017-11-11 LAB — COMPREHENSIVE METABOLIC PANEL
ALBUMIN: 4.4 g/dL (ref 3.5–5.0)
ALT: 40 U/L (ref 0–44)
ANION GAP: 16 — AB (ref 5–15)
AST: 58 U/L — ABNORMAL HIGH (ref 15–41)
Alkaline Phosphatase: 142 U/L — ABNORMAL HIGH (ref 38–126)
BILIRUBIN TOTAL: 0.9 mg/dL (ref 0.3–1.2)
BUN: 15 mg/dL (ref 6–20)
CALCIUM: 8.8 mg/dL — AB (ref 8.9–10.3)
CO2: 25 mmol/L (ref 22–32)
CREATININE: 0.98 mg/dL (ref 0.61–1.24)
Chloride: 107 mmol/L (ref 98–111)
GFR calc Af Amer: >60 mL/min (ref >60)
GFR calc non Af Amer: >60 mL/min (ref >60)
GLUCOSE: 97 mg/dL (ref 70–99)
Potassium: 3.7 mmol/L (ref 3.5–5.1)
Sodium: 148 mmol/L — ABNORMAL HIGH (ref 135–145)
TOTAL PROTEIN: 7.1 g/dL (ref 6.5–8.1)

## 2017-11-11 LAB — CBC WITH DIFFERENTIAL/PLATELET
Abs Immature Granulocytes: 0.03 10*3/uL (ref 0.00–0.07)
BASOS ABS: 0.1 10*3/uL (ref 0.0–0.1)
Basophils Relative: 1 %
Eosinophils Absolute: 0.1 10*3/uL (ref 0.0–0.5)
Eosinophils Relative: 1 %
HEMATOCRIT: 43.5 % (ref 39.0–52.0)
Hemoglobin: 14.5 g/dL (ref 13.0–17.0)
Immature Granulocytes: 0 %
LYMPHS ABS: 2.4 10*3/uL (ref 0.7–4.0)
Lymphocytes Relative: 26 %
MCH: 27.7 pg (ref 26.0–34.0)
MCHC: 33.3 g/dL (ref 30.0–36.0)
MCV: 83 fL (ref 80.0–100.0)
Monocytes Absolute: 0.5 10*3/uL (ref 0.1–1.0)
Monocytes Relative: 5 %
NRBC: 0 % (ref 0.0–0.2)
Neutro Abs: 6.2 10*3/uL (ref 1.7–7.7)
Neutrophils Relative %: 67 %
Platelets: 212 10*3/uL (ref 150–400)
RBC: 5.24 MIL/uL (ref 4.22–5.81)
RDW: 14.4 % (ref 11.5–15.5)
WBC: 9.3 10*3/uL (ref 4.0–10.5)

## 2017-11-11 LAB — ETHANOL: Alcohol, Ethyl (B): 344 mg/dL (ref <10)

## 2017-11-11 MED ORDER — SODIUM CHLORIDE 0.9 % IV BOLUS
1000.0000 mL | Freq: Once | INTRAVENOUS | Status: AC
  Administered 2017-11-11: 1000 mL via INTRAVENOUS

## 2017-11-11 NOTE — ED Notes (Signed)
Pt was given a Sprite. Pt was instructed to take small sips.

## 2017-11-11 NOTE — ED Notes (Signed)
Pt ambulated to bathroom 

## 2017-11-11 NOTE — ED Triage Notes (Signed)
Per [facility] pt was [place].  Pt non-compliant with HTN medications. Vitals: 166/110, 76HR, CBG 99

## 2017-11-11 NOTE — ED Provider Notes (Signed)
[facility]-EMERGENCY DEPT Provider Note   CSN: [id number] Arrival date & time: 11/11/17  1839     History   Chief Complaint Chief Complaint  Patient presents with  . Alcohol Intoxication    HPI Derek [name] is a 57 y.o. male.  [place].  He has been drinking a lot of alcohol.  Patient admits to drinking alcohol  The history is provided by the patient. No language interpreter was used.  Alcohol Intoxication  This is a chronic problem. The current episode started 12 to 24 hours ago. The problem occurs constantly. The problem has not changed since onset.Pertinent negatives include no chest pain, no abdominal pain and no headaches. Nothing aggravates the symptoms. Nothing relieves the symptoms. He has tried nothing for the symptoms. The treatment provided no relief.    Past Medical History:  Diagnosis Date  . Alcohol abuse   . Depression   . Essential hypertension 11/08/2015  . Hypertension   . Obesity (BMI 30.0-34.9) 11/08/2015  . Sinus bradycardia 11/08/2015    Patient Active Problem List   Diagnosis Date Noted  . 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 Major depressive disorder, recurrent episode (HCC) 12/19/2016  . Alcohol use disorder, severe, dependence (HCC) 12/19/2016  . Essential hypertension 11/08/2015  . Obesity (BMI 30.0-34.9) 11/08/2015  . Sinus bradycardia 11/08/2015    Past Surgical History:  Procedure Laterality Date  . CHOLECYSTECTOMY    . GASTRIC BYPASS     2006 - done at [facility]  . ROTATOR CUFF REPAIR  2017  . ROUX-EN-Y PROCEDURE  2007  . SHOULDER ARTHROSCOPY WITH SUBACROMIAL DECOMPRESSION Right 04/17/2015   Procedure: SHOULDER ARTHROSCOPY ROTATOR CUFF DEBRIDEMENT WITH SUBACROMIAL DECOMPRESSION;  Surgeon: Jones Broom, MD;  Location: [facility];  Service: Orthopedics;  Laterality: Right;  . TONSILLECTOMY    . wisdom teeth  extractions          Home Medications    Prior to Admission medications   Medication Sig Start Date End Date Taking? Authorizing Provider  buPROPion (WELLBUTRIN XL) 150 MG 24 hr tablet Take 1 tablet (150 mg total) daily by mouth. 12/23/16  Yes Oneta Rack, NP  elvitegravir-cobicistat-emtricitabine-tenofovir (GENVOYA) 150-150-200-10 MG TABS tablet Take 1 tablet by mouth daily with breakfast. 05/29/17  Yes Benjiman Core, MD  Escitalopram Oxalate (LEXAPRO PO) Take 1 tablet by mouth daily.   Yes [provider]  FLUoxetine (PROZAC) 40 MG capsule Take 1 capsule (40 mg total) daily by mouth. 12/23/16  Yes Oneta Rack, NP  gabapentin (NEURONTIN) 100 MG capsule Take 1 capsule (100 mg total) 3 (three) times daily by mouth. 12/22/16  Yes Oneta Rack, NP  acamprosate (CAMPRAL) 333 MG tablet Take 2 tablets (666 mg total) 3 (three) times daily by mouth. Patient not taking: Reported on 11/11/2017 12/22/16   Oneta Rack, NP  chlordiazePOXIDE (LIBRIUM) 25 MG capsule 50mg  PO TID x 1D, then 25-50mg  PO BID X 1D, then 25-50mg  PO QD X 1D Patient not taking: Reported on 11/11/2017 03/20/17   Terrilee Files, MD  elvitegravir-cobicistat-emtricitabine-tenofovir (GENVOYA) 150-150-200-10 MG TABS tablet Take 1 tablet by mouth daily with breakfast. Patient not taking: Reported on 11/11/2017 05/29/17   Benjiman Core, MD  traZODone (DESYREL) 50 MG tablet Take 1 tablet (50 mg total) at bedtime and may repeat dose one time if needed by mouth. Patient not taking: Reported on 11/11/2017 12/22/16   Oneta Rack, NP  Family History Family History  Problem Relation Age of Onset  . Cancer Mother   . Peripheral Artery Disease Father   . Heart attack Maternal Grandfather     Social History Social History   Tobacco Use  . Smoking status: Never Smoker  . Smokeless tobacco: Never Used  Substance Use Topics  . Alcohol use: Yes  . Drug use: Yes    Types: Cocaine     Allergies     Patient has no known allergies.   Review of Systems Review of Systems  Constitutional: Negative for appetite change and fatigue.  HENT: Negative for congestion, ear discharge and sinus pressure.   Eyes: Negative for discharge.  Respiratory: Negative for cough.   Cardiovascular: Negative for chest pain.  Gastrointestinal: Negative for abdominal pain and diarrhea.  Genitourinary: Negative for frequency and hematuria.  Musculoskeletal: Negative for back pain.  Skin: Negative for rash.  Neurological: Negative for seizures and headaches.  Psychiatric/Behavioral: Negative for hallucinations.     Physical Exam Updated Vital Signs BP 138/78 (BP Location: Right Arm)   Pulse 88   Temp 98.2 F (36.8 C) (Oral)   Resp 15   SpO2 100%   Physical Exam  Constitutional: He is oriented to person, place, and time. He appears well-developed.  HENT:  Head: Normocephalic.  Abrasion to the left ear  Eyes: Conjunctivae and EOM are normal. No scleral icterus.  Neck: Neck supple. No thyromegaly present.  Cardiovascular: Normal rate and regular rhythm. Exam reveals no gallop and no friction rub.  No murmur heard. Pulmonary/Chest: No stridor. He has no wheezes. He has no rales. He exhibits no tenderness.  Abdominal: He exhibits no distension. There is no tenderness. There is no rebound.  Musculoskeletal: Normal range of motion. He exhibits no edema.  Lymphadenopathy:    He has no cervical adenopathy.  Neurological: He is oriented to person, place, and time. He exhibits normal muscle tone. Coordination abnormal.  She has ataxic  Skin: No rash noted. No erythema.  Psychiatric: He has a normal mood and affect. His behavior is normal.     ED Treatments / Results  Labs (all labs ordered are listed, but only abnormal results are displayed) Labs Reviewed  COMPREHENSIVE METABOLIC PANEL - Abnormal; Notable for the following components:      Result Value   Sodium 148 (*)    Calcium 8.8 (*)    AST  58 (*)    Alkaline Phosphatase 142 (*)    Anion gap 16 (*)    All other components within normal limits  ETHANOL - Abnormal; Notable for the following components:   Alcohol, Ethyl (B) 344 (*)    All other components within normal limits  CBC WITH DIFFERENTIAL/PLATELET    EKG None  Radiology Ct Head Wo Contrast  Result Date: 11/11/2017 CLINICAL DATA:  Ataxia.  Scalp lacerations. EXAM: CT HEAD WITHOUT CONTRAST TECHNIQUE: Contiguous axial images were obtained from the base of the skull through the vertex without intravenous contrast. COMPARISON:  CT head 12/15/2016 FINDINGS: Brain: Ventricle size normal. Mild cortical atrophy. Patchy hypodensity in the parietal white matter bilaterally is chronic and unchanged. Negative for acute infarct, hemorrhage, or mass Vascular: Negative for hyperdense vessel Skull: Negative Sinuses/Orbits: Negative Other: None IMPRESSION: No acute abnormality. Electronically Signed   By: Marlan Palau M.D.   On: 11/11/2017 20:31    Procedures Procedures (including critical care time)  Medications Ordered in ED Medications  sodium chloride 0.9 % bolus 1,000 mL (1,000 mLs Intravenous New  Bag/Given 11/11/17 2200)     Initial Impression / Assessment and Plan / ED Course  I have reviewed the triage vital signs and the nursing notes.  Pertinent labs & imaging results that were available during my care of the patient were reviewed by me and considered in my medical decision making (see chart for details).     CT scan of the head unremarkable.  Labs show patient has had significant alcohol.    Patient was ambulated at 11 PM but felt dizzy and weak and sat back down Final Clinical Impressions(s) / ED Diagnoses   Final diagnoses:  None    ED Discharge Orders    None       Bethann Berkshire, MD 11/11/17 2333

## 2017-11-11 NOTE — ED Notes (Signed)
Urine sample sent to lab. Will call to add if lab is ordered.

## 2017-11-12 ENCOUNTER — Emergency Department (HOSPITAL_COMMUNITY)
Admission: EM | Admit: 2017-11-12 | Discharge: 2017-11-12 | Disposition: A | Discharge status: Home / Self Care | Payer: 59 | Source: Home / Self Care | Attending: Emergency Medicine | Admitting: Emergency Medicine

## 2017-11-12 ENCOUNTER — Encounter (HOSPITAL_COMMUNITY): Payer: Self-pay | Admitting: Emergency Medicine

## 2017-11-12 ENCOUNTER — Emergency Department (HOSPITAL_COMMUNITY): Payer: 59

## 2017-11-12 DIAGNOSIS — F1410 Cocaine abuse, uncomplicated: Secondary | ICD-10-CM | POA: Insufficient documentation

## 2017-11-12 DIAGNOSIS — Z79899 Other long term (current) drug therapy: Secondary | ICD-10-CM | POA: Insufficient documentation

## 2017-11-12 DIAGNOSIS — I10 Essential (primary) hypertension: Secondary | ICD-10-CM | POA: Insufficient documentation

## 2017-11-12 DIAGNOSIS — F10920 Alcohol use, unspecified with intoxication, uncomplicated: Secondary | ICD-10-CM

## 2017-11-12 LAB — URINALYSIS, ROUTINE W REFLEX MICROSCOPIC
BILIRUBIN URINE: NEGATIVE
GLUCOSE, UA: NEGATIVE mg/dL
Hgb urine dipstick: NEGATIVE
KETONES UR: NEGATIVE mg/dL
LEUKOCYTES UA: NEGATIVE
Nitrite: NEGATIVE
PROTEIN: NEGATIVE mg/dL
Specific Gravity, Urine: 1.002 — ABNORMAL LOW (ref 1.005–1.030)
pH: 6 (ref 5.0–8.0)

## 2017-11-12 LAB — COMPREHENSIVE METABOLIC PANEL WITH GFR
ALT: 36 U/L (ref 0–44)
AST: 55 U/L — ABNORMAL HIGH (ref 15–41)
Albumin: 3.9 g/dL (ref 3.5–5.0)
Alkaline Phosphatase: 124 U/L (ref 38–126)
Anion gap: 14 (ref 5–15)
BUN: 12 mg/dL (ref 6–20)
CO2: 23 mmol/L (ref 22–32)
Calcium: 8.4 mg/dL — ABNORMAL LOW (ref 8.9–10.3)
Chloride: 105 mmol/L (ref 98–111)
Creatinine, Ser: 0.85 mg/dL (ref 0.61–1.24)
GFR calc Af Amer: >60 mL/min
GFR calc non Af Amer: >60 mL/min
Glucose, Bld: 101 mg/dL — ABNORMAL HIGH (ref 70–99)
Potassium: 3.5 mmol/L (ref 3.5–5.1)
Sodium: 142 mmol/L (ref 135–145)
Total Bilirubin: 1 mg/dL (ref 0.3–1.2)
Total Protein: 6.4 g/dL — ABNORMAL LOW (ref 6.5–8.1)

## 2017-11-12 LAB — CBC WITH DIFFERENTIAL/PLATELET
Abs Immature Granulocytes: 0.02 K/uL (ref 0.00–0.07)
Basophils Absolute: 0.1 K/uL (ref 0.0–0.1)
Basophils Relative: 1 %
Eosinophils Absolute: 0.1 K/uL (ref 0.0–0.5)
Eosinophils Relative: 1 %
HCT: 40.6 % (ref 39.0–52.0)
Hemoglobin: 13.6 g/dL (ref 13.0–17.0)
Immature Granulocytes: 0 %
Lymphocytes Relative: 23 %
Lymphs Abs: 2.7 K/uL (ref 0.7–4.0)
MCH: 27.6 pg (ref 26.0–34.0)
MCHC: 33.5 g/dL (ref 30.0–36.0)
MCV: 82.5 fL (ref 80.0–100.0)
Monocytes Absolute: 0.6 K/uL (ref 0.1–1.0)
Monocytes Relative: 5 %
Neutro Abs: 8.1 K/uL — ABNORMAL HIGH (ref 1.7–7.7)
Neutrophils Relative %: 70 %
Platelets: 201 K/uL (ref 150–400)
RBC: 4.92 MIL/uL (ref 4.22–5.81)
RDW: 14.3 % (ref 11.5–15.5)
WBC: 11.5 K/uL — ABNORMAL HIGH (ref 4.0–10.5)
nRBC: 0 % (ref 0.0–0.2)

## 2017-11-12 LAB — I-STAT TROPONIN, ED: TROPONIN I, POC: 0.01 ng/mL (ref 0.00–0.08)

## 2017-11-12 LAB — ACETAMINOPHEN LEVEL

## 2017-11-12 LAB — RAPID URINE DRUG SCREEN, HOSP PERFORMED
Amphetamines: NOT DETECTED
Barbiturates: NOT DETECTED
Benzodiazepines: NOT DETECTED
Cocaine: NOT DETECTED
Opiates: NOT DETECTED
Tetrahydrocannabinol: NOT DETECTED

## 2017-11-12 LAB — ETHANOL: Alcohol, Ethyl (B): 288 mg/dL — ABNORMAL HIGH

## 2017-11-12 LAB — SALICYLATE LEVEL

## 2017-11-12 NOTE — ED Provider Notes (Signed)
[facility]-EMERGENCY DEPT Provider Note   CSN: [id number] Arrival date & time: 11/12/17  1205     History   Chief Complaint Chief Complaint  Patient presents with  . Anxiety    HPI Rich Paprocki is a 57 y.o. male.  57 year old male with prior medical history as detailed below presents for evaluation.  He arrives by EMS.  He reportedly is undomiciled and has a long-standing history of alcohol use and abuse.  He appears to be clinically intoxicated upon my initial evaluation.  He has a host of nonspecific complaints.  He denies suicidal ideation or homicidal ideation.  He is not aggressive.  He has had multiple recent evaluations with findings of alcohol intoxication.  The history is provided by the patient and medical records.  Illness  This is a chronic problem. The current episode started more than 1 week ago. The problem occurs daily. The problem has not changed since onset.Pertinent negatives include no chest pain, no abdominal pain, no headaches and no shortness of breath. Nothing aggravates the symptoms. Nothing relieves the symptoms.    Past Medical History:  Diagnosis Date  . Alcohol abuse   . Depression   . Essential hypertension 11/08/2015  . Hypertension   . Obesity (BMI 30.0-34.9) 11/08/2015  . Sinus bradycardia 11/08/2015    Patient Active Problem List   Diagnosis Date Noted  . 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 Major depressive disorder, recurrent episode (HCC) 12/19/2016  . Alcohol use disorder, severe, dependence (HCC) 12/19/2016  . Essential hypertension 11/08/2015  . Obesity (BMI 30.0-34.9) 11/08/2015  . Sinus bradycardia 11/08/2015    Past Surgical History:  Procedure Laterality Date  . CHOLECYSTECTOMY    . GASTRIC BYPASS     2006 - done at [facility]  . ROTATOR CUFF REPAIR  2017  . ROUX-EN-Y PROCEDURE  2007  . SHOULDER ARTHROSCOPY WITH SUBACROMIAL DECOMPRESSION Right 04/17/2015   Procedure: SHOULDER ARTHROSCOPY ROTATOR CUFF DEBRIDEMENT WITH SUBACROMIAL DECOMPRESSION;  Surgeon: Jones Broom, MD;  Location: [facility];  Service: Orthopedics;  Laterality: Right;  . TONSILLECTOMY    . wisdom teeth extractions          Home Medications    Prior to Admission medications   Medication Sig Start Date End Date Taking? Authorizing Provider  buPROPion (WELLBUTRIN XL) 150 MG 24 hr tablet Take 1 tablet (150 mg total) daily by mouth. 12/23/16   Oneta Rack, NP  elvitegravir-cobicistat-emtricitabine-tenofovir (GENVOYA) 150-150-200-10 MG TABS tablet Take 1 tablet by mouth daily with breakfast. 05/29/17   Benjiman Core, MD  Escitalopram Oxalate (LEXAPRO PO) Take 1 tablet by mouth daily.    [provider]  FLUoxetine (PROZAC) 40 MG capsule Take 1 capsule (40 mg total) daily by mouth. 12/23/16   Oneta Rack, NP  gabapentin (NEURONTIN) 100 MG capsule Take 1 capsule (100 mg total) 3 (three) times daily by mouth. 12/22/16   Oneta Rack, NP    Family History Family History  Problem Relation Age of Onset  . Cancer Mother   . Peripheral Artery Disease Father   . Heart attack Maternal Grandfather     Social History Social History   Tobacco Use  . Smoking status: Never Smoker  . Smokeless tobacco: Never Used  Substance Use Topics  . Alcohol use: Yes  . Drug use: Yes    Types: Cocaine     Allergies   Patient has no known allergies.   Review of Systems Review  of Systems  Respiratory: Negative for shortness of breath.   Cardiovascular: Negative for chest pain.  Gastrointestinal: Negative for abdominal pain.  Neurological: Negative for headaches.  All other systems reviewed and are negative.    Physical Exam Updated Vital Signs BP 137/83   Pulse 74   Temp 97.9 F (36.6 C) (Oral)   Resp 18   Ht 6\' 1"  (1.854 m)   Wt 108.9 kg   SpO2 91%   BMI 31.66 kg/m   Physical Exam  Constitutional: He is oriented to person,  place, and time. He appears well-developed and well-nourished. No distress.  Initially appears clinically intoxicated  HENT:  Head: Normocephalic and atraumatic.  Mouth/Throat: Oropharynx is clear and moist.  Eyes: Pupils are equal, round, and reactive to light. Conjunctivae and EOM are normal.  Neck: Normal range of motion. Neck supple.  Cardiovascular: Normal rate, regular rhythm and normal heart sounds.  Pulmonary/Chest: Effort normal and breath sounds normal. No respiratory distress.  Abdominal: Soft. He exhibits no distension. There is no tenderness.  Musculoskeletal: Normal range of motion. He exhibits no edema or deformity.  Neurological: He is alert and oriented to person, place, and time.  Skin: Skin is warm and dry.  Psychiatric: He has a normal mood and affect.  Nursing note and vitals reviewed.    ED Treatments / Results  Labs (all labs ordered are listed, but only abnormal results are displayed) Labs Reviewed  ETHANOL - Abnormal; Notable for the following components:      Result Value   Alcohol, Ethyl (B) 288 (*)    All other components within normal limits  COMPREHENSIVE METABOLIC PANEL - Abnormal; Notable for the following components:   Glucose, Bld 101 (*)    Calcium 8.4 (*)    Total Protein 6.4 (*)    AST 55 (*)    All other components within normal limits  CBC WITH DIFFERENTIAL/PLATELET - Abnormal; Notable for the following components:   WBC 11.5 (*)    Neutro Abs 8.1 (*)    All other components within normal limits  URINALYSIS, ROUTINE W REFLEX MICROSCOPIC - Abnormal; Notable for the following components:   Color, Urine COLORLESS (*)    Specific Gravity, Urine 1.002 (*)    All other components within normal limits  ACETAMINOPHEN LEVEL - Abnormal; Notable for the following components:   Acetaminophen (Tylenol), Serum <10 (*)    All other components within normal limits  RAPID URINE DRUG SCREEN, HOSP PERFORMED  SALICYLATE LEVEL  I-STAT TROPONIN, ED     EKG EKG Interpretation  Date/Time:  Wednesday November 12 2017 12:27:26 EDT Ventricular Rate:  77 PR Interval:    QRS Duration: 112 QT Interval:  428 QTC Calculation: 485 R Axis:   -68 Text Interpretation:  Sinus or ectopic atrial rhythm Left anterior fascicular block Probable anterior infarct, age indeterminate Confirmed by Kristine Royal ([id number]) on 11/12/2017 12:55:36 PM   Radiology Ct Head Wo Contrast  Result Date: 11/11/2017 CLINICAL DATA:  Ataxia.  Scalp lacerations. EXAM: CT HEAD WITHOUT CONTRAST TECHNIQUE: Contiguous axial images were obtained from the base of the skull through the vertex without intravenous contrast. COMPARISON:  CT head 12/15/2016 FINDINGS: Brain: Ventricle size normal. Mild cortical atrophy. Patchy hypodensity in the parietal white matter bilaterally is chronic and unchanged. Negative for acute infarct, hemorrhage, or mass Vascular: Negative for hyperdense vessel Skull: Negative Sinuses/Orbits: Negative Other: None IMPRESSION: No acute abnormality. Electronically Signed   By: Marlan Palau M.D.   On: 11/11/2017 20:31  Dg Chest Port 1 View  Result Date: 11/12/2017 CLINICAL DATA:  Chest pain EXAM: PORTABLE CHEST 1 VIEW COMPARISON:  12/15/2016 FINDINGS: Normal heart size and mediastinal contours. There is no edema, consolidation, effusion, or pneumothorax. No acute osseous finding IMPRESSION: No evidence of active disease. Electronically Signed   By: Marnee Spring M.D.   On: 11/12/2017 13:24    Procedures Procedures (including critical care time)  Medications Ordered in ED Medications - No data to display   Initial Impression / Assessment and Plan / ED Course  I have reviewed the triage vital signs and the nursing notes.  Pertinent labs & imaging results that were available during my care of the patient were reviewed by me and considered in my medical decision making (see chart for details).     MDM  Screen complete  Patient is presenting for  evaluation in the setting of acute on chronic alcohol intoxication.  Patient is without evidence on exam of significant acute pathology.  Screening labs obtained do not reveal significant abnormality other than alcohol intoxication.  Patient with recent CT head imaging done yesterday.  Patient observed briefly in the ED.  Following his observation.  He feels improved.  He declines further evaluation and/or treatment.  He now desires discharge.  Patient understands the resources available to him if he desires to stop drinking.  Importance of close follow-up is stressed.  Strict return precautions are given and understood.  Final Clinical Impressions(s) / ED Diagnoses   Final diagnoses:  Alcoholic intoxication without complication [facility])    ED Discharge Orders    None       Wynetta Fines, MD 11/12/17 1359

## 2017-11-12 NOTE — ED Notes (Signed)
Pt is awake drinking a Sprite. Pt ambulated without complaints of dizziness.

## 2017-11-12 NOTE — ED Triage Notes (Signed)
Started drinking beer at 8 am, bourbon at 9, around 1/5 of alcohol ingested total, patient estimates.

## 2017-11-12 NOTE — ED Notes (Signed)
Bed: WA16 Expected date:  Expected time:  Means of arrival:  Comments: EMS/SI 

## 2017-11-12 NOTE — ED Triage Notes (Signed)
-  Arrived via [facility] from street -Heart problem call originally, crying on scene, bystander called EMS, said chest pain since this AM, per EMS he appears to be suffering from alcohol abuse, anxiety, depression -3 pints liquor this AM, not "acting drunk" per EMS -appears to be living on street for a few weeks, family up in [facility]  -Takes adderal, lexapro and Wellbutrin, off his meds for a while -12 lead unremarkable  -Pain increases w/deep inspiration  -requesting social services and wants to detox/ rehab  Vitals  -BP 126/91 -P 76 -O2 98%  -CBG 110 -20 G R wrist

## 2017-11-12 NOTE — Discharge Instructions (Addendum)
Return for any problem.  Follow-up with your regular doctor as instructed. °

## 2017-11-13 ENCOUNTER — Emergency Department (HOSPITAL_COMMUNITY): Payer: 59

## 2017-11-13 ENCOUNTER — Emergency Department (HOSPITAL_COMMUNITY)
Admission: EM | Admit: 2017-11-13 | Discharge: 2017-11-13 | Disposition: A | Discharge status: Home / Self Care | Payer: 59 | Attending: Emergency Medicine | Admitting: Emergency Medicine

## 2017-11-13 ENCOUNTER — Encounter (HOSPITAL_COMMUNITY): Payer: Self-pay | Admitting: Emergency Medicine

## 2017-11-13 DIAGNOSIS — K922 Gastrointestinal hemorrhage, unspecified: Secondary | ICD-10-CM | POA: Insufficient documentation

## 2017-11-13 DIAGNOSIS — Z79899 Other long term (current) drug therapy: Secondary | ICD-10-CM | POA: Diagnosis not present

## 2017-11-13 DIAGNOSIS — R202 Paresthesia of skin: Secondary | ICD-10-CM

## 2017-11-13 DIAGNOSIS — I10 Essential (primary) hypertension: Secondary | ICD-10-CM | POA: Diagnosis not present

## 2017-11-13 DIAGNOSIS — R0789 Other chest pain: Secondary | ICD-10-CM | POA: Diagnosis not present

## 2017-11-13 DIAGNOSIS — M6281 Muscle weakness (generalized): Secondary | ICD-10-CM | POA: Insufficient documentation

## 2017-11-13 LAB — CBC
HCT: 42.3 % (ref 39.0–52.0)
Hemoglobin: 14 g/dL (ref 13.0–17.0)
MCH: 27.5 pg (ref 26.0–34.0)
MCHC: 33.1 g/dL (ref 30.0–36.0)
MCV: 83.1 fL (ref 80.0–100.0)
NRBC: 0 % (ref 0.0–0.2)
PLATELETS: 191 10*3/uL (ref 150–400)
RBC: 5.09 MIL/uL (ref 4.22–5.81)
RDW: 14.5 % (ref 11.5–15.5)
WBC: 7.2 10*3/uL (ref 4.0–10.5)

## 2017-11-13 LAB — BASIC METABOLIC PANEL
Anion gap: 15 (ref 5–15)
BUN: 13 mg/dL (ref 6–20)
CALCIUM: 8.7 mg/dL — AB (ref 8.9–10.3)
CHLORIDE: 105 mmol/L (ref 98–111)
CO2: 23 mmol/L (ref 22–32)
CREATININE: 0.87 mg/dL (ref 0.61–1.24)
Glucose, Bld: 120 mg/dL — ABNORMAL HIGH (ref 70–99)
Potassium: 3.6 mmol/L (ref 3.5–5.1)
SODIUM: 143 mmol/L (ref 135–145)

## 2017-11-13 LAB — POCT I-STAT TROPONIN I: Troponin i, poc: 0 ng/mL (ref 0.00–0.08)

## 2017-11-13 NOTE — Progress Notes (Signed)
[facility] CSW  Spoke with pt over the phone. Pt reported that he has been homeless for the past week and half. Pt had been recently discharged from a rehab center in [place] after 10 weeks. Pt was picked up by his wife and to go to a [facility] in [place] in [place]. Pt was going to be provided housing by the church. Per pt, pt drank again and did not go to the church. Pt's wife is not allowing him to stay with her at this time due to safety issues with his drinking. CSW provided pt with homeless resources via fax and reviewed resources with pt. CSW provided outpatient and residential substance abuse resources. Pt aware of AA meetings in [place]. Pt has been staying at church and receiving meals there occasionally. CSW informed pt about [facility] to get more information about meals.   Montine Circle, [facility]  [phone]

## 2017-11-13 NOTE — ED Provider Notes (Signed)
[facility]-EMERGENCY DEPT Provider Note   CSN: [id number] Arrival date & time: 11/13/17  1019     History   Chief Complaint Chief Complaint  Patient presents with  . Chest Pain  . Arm Pain  . Rectal Bleeding    HPI Yonathan Perrow is a 57 y.o. male.  HPI Patient presents with a few different complaints.  Has blood in the stool.  States there was some blood in his underwear tonight.  No apparent bleeding.  States it was red.  No lightheadedness or dizziness.  Does have a history of alcoholism and homelessness.  Seen yesterday in the day before in the ER.  Also states he has some chest pain.  In his left chest.  It is dull. Also complaining of left upper extremity numbness.  States it feels like it is numb and is having trouble moving it also.  No trauma.  Does not think he is injured it but also does sleep on it at times.  States he also has an area on his right thigh he has had chronically that is numb in this area on the left arm feels like that.  No headache.  Recent head CT 2 days ago when seen in the ER for unsteadiness.  No neck pain. Past Medical History:  Diagnosis Date  . Alcohol abuse   . Depression   . Essential hypertension 11/08/2015  . Hypertension   . Obesity (BMI 30.0-34.9) 11/08/2015  . Sinus bradycardia 11/08/2015    Patient Active Problem List   Diagnosis Date Noted  . 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 Major depressive disorder, recurrent episode (HCC) 12/19/2016  . Alcohol use disorder, severe, dependence (HCC) 12/19/2016  . Essential hypertension 11/08/2015  . Obesity (BMI 30.0-34.9) 11/08/2015  . Sinus bradycardia 11/08/2015    Past Surgical History:  Procedure Laterality Date  . CHOLECYSTECTOMY    . GASTRIC BYPASS     2006 - [facility] at [facility]  . ROTATOR CUFF REPAIR  2017  . ROUX-EN-Y PROCEDURE  2007  . SHOULDER ARTHROSCOPY WITH SUBACROMIAL DECOMPRESSION Right 04/17/2015   Procedure:  SHOULDER ARTHROSCOPY ROTATOR CUFF DEBRIDEMENT WITH SUBACROMIAL DECOMPRESSION;  Surgeon: Jones Broom, MD;  Location: [facility];  Service: Orthopedics;  Laterality: Right;  . TONSILLECTOMY    . wisdom teeth extractions          Home Medications    Prior to Admission medications   Medication Sig Start Date End Date Taking? Authorizing Provider  amphetamine-dextroamphetamine (ADDERALL) 20 MG tablet Take 20 mg by mouth daily.   Yes [provider]  buPROPion (WELLBUTRIN XL) 150 MG 24 hr tablet Take 1 tablet (150 mg total) daily by mouth. 12/23/16  Yes Oneta Rack, NP  Escitalopram Oxalate (LEXAPRO PO) Take 1 tablet by mouth daily.   Yes [provider]  FLUoxetine (PROZAC) 40 MG capsule Take 1 capsule (40 mg total) daily by mouth. 12/23/16  Yes Oneta Rack, NP  gabapentin (NEURONTIN) 100 MG capsule Take 1 capsule (100 mg total) 3 (three) times daily by mouth. 12/22/16  Yes Oneta Rack, NP  losartan (COZAAR) 25 MG tablet Take 25 mg by mouth daily.   Yes [provider]  elvitegravir-cobicistat-emtricitabine-tenofovir (GENVOYA) 150-150-200-10 MG TABS tablet Take 1 tablet by mouth daily with breakfast. Patient not taking: Reported on 11/13/2017 05/29/17   Benjiman Core, MD    Family History Family History  Problem Relation Age of Onset  . Cancer Mother   .  Peripheral Artery Disease Father   . Heart attack Maternal Grandfather     Social History Social History   Tobacco Use  . Smoking status: Never Smoker  . Smokeless tobacco: Never Used  Substance Use Topics  . Alcohol use: Yes  . Drug use: Yes    Types: Cocaine     Allergies   Patient has no known allergies.   Review of Systems Review of Systems  Constitutional: Negative for appetite change.  HENT: Negative for congestion.   Cardiovascular: Positive for chest pain.  Gastrointestinal: Negative for abdominal pain.  Musculoskeletal: Negative for back pain and  neck pain.  Skin: Negative for rash.  Neurological: Positive for weakness and numbness.  Psychiatric/Behavioral: Negative for confusion.     Physical Exam Updated Vital Signs BP 133/86 (BP Location: Right Arm)   Pulse 82   Temp 97.7 F (36.5 C) (Oral)   Resp 20   Ht 6\' 1"  (1.854 m)   Wt 108.9 kg   SpO2 98%   BMI 31.66 kg/m   Physical Exam  Constitutional: He appears well-developed.  HENT:  Abrasion to right forehead.  Cardiovascular: Normal rate and normal pulses.  Pulmonary/Chest: Effort normal.  Abdominal: Soft. There is no tenderness.  Musculoskeletal: Normal range of motion.  Neurological: He is alert.  Good strength sensation in right upper extremity.  No neck tenderness.  No tenderness over shoulder.  Good extension at shoulder.  Somewhat decreased strength with flexion at elbow and extension at elbow.  Fingers are held more extended.  Difficulty flexing them.  Is able to oppose thumb somewhat but does have difficulty with chronic joint problems in that hand.  Radial pulse intact.  Decreased sensation over forearm and all distributions of hand.  Skin: Skin is warm.     ED Treatments / Results  Labs (all labs ordered are listed, but only abnormal results are displayed) Labs Reviewed  BASIC METABOLIC PANEL - Abnormal; Notable for the following components:      Result Value   Glucose, Bld 120 (*)    Calcium 8.7 (*)    All other components within normal limits  CBC  I-STAT TROPONIN, ED  POCT I-STAT TROPONIN I  POC OCCULT BLOOD, ED  TYPE AND SCREEN    EKG EKG Interpretation  Date/Time:  Thursday November 13 2017 10:32:41 EDT Ventricular Rate:  88 PR Interval:    QRS Duration: 87 QT Interval:  492 QTC Calculation: 596 R Axis:   -55 Text Interpretation:  Sinus or ectopic atrial rhythm Inferior infarct, old Consider anterior infarct Prolonged QT interval No significant change since last tracing Confirmed by Benjiman Core [id number]) on 11/13/2017 11:21:38 AM  Also confirmed by Benjiman Core [phone]), editor Barbette Hair [phone])  on 11/13/2017 2:21:34 PM   Radiology Dg Chest 2 View  Result Date: 11/13/2017 CLINICAL DATA:  Generalized chest pain left arm pain starting last night. EXAM: CHEST - 2 VIEW COMPARISON:  11/12/2017. FINDINGS: Trachea is midline given slight patient rotation. Heart size normal. Lungs are clear. No pleural fluid. IMPRESSION: No acute findings. Electronically Signed   By: Leanna Battles M.D.   On: 11/13/2017 11:20   Ct Head Wo Contrast  Result Date: 11/11/2017 CLINICAL DATA:  Ataxia.  Scalp lacerations. EXAM: CT HEAD WITHOUT CONTRAST TECHNIQUE: Contiguous axial images were obtained from the base of the skull through the vertex without intravenous contrast. COMPARISON:  CT head 12/15/2016 FINDINGS: Brain: Ventricle size normal. Mild cortical atrophy. Patchy hypodensity in the parietal white matter bilaterally is  chronic and unchanged. Negative for acute infarct, hemorrhage, or mass Vascular: Negative for hyperdense vessel Skull: Negative Sinuses/Orbits: Negative Other: None IMPRESSION: No acute abnormality. Electronically Signed   By: Marlan Palau M.D.   On: 11/11/2017 20:31   Mr Brain Wo Contrast  Result Date: 11/13/2017 CLINICAL DATA:  Focal neuro deficit, suspect stroke. Right arm numbness and weakness EXAM: MRI HEAD WITHOUT CONTRAST MRI CERVICAL SPINE WITHOUT CONTRAST TECHNIQUE: Multiplanar, multiecho pulse sequences of the brain and surrounding structures, and cervical spine, to include the craniocervical junction and cervicothoracic junction, were obtained without intravenous contrast. COMPARISON:  CT head 11/11/2017 FINDINGS: MRI HEAD FINDINGS Brain: Negative for acute infarct. Mild chronic microvascular ischemic change in the white matter. Negative for hemorrhage or mass. Ventricle size normal. Vascular: Normal arterial flow voids in Skull and upper cervical spine: Negative Sinuses/Orbits: Mild mucosal edema paranasal  sinuses.  Normal orbit Other: None MRI CERVICAL SPINE FINDINGS Alignment: Normal alignment Image quality degraded by moderate motion Vertebrae: Negative for fracture or mass Cord: Limited cord evaluation due to motion. No cord lesion identified. Posterior Fossa, vertebral arteries, paraspinal tissues: Negative Disc levels: C2-3: Negative C3-4: Moderate left foraminal encroachment due to facet hypertrophy and mild uncinate spurring C4-5 moderate foraminal stenosis bilaterally due to facet hypertrophy and mild uncinate spurring C5-6: Moderate foraminal encroachment bilaterally with mild spinal stenosis due to diffuse uncinate spur and facet hypertrophy. 1 cm cyst posterior to the facet on the right at C6-7 most likely a synovial cyst projecting into the muscles. C6-7: Mild foraminal narrowing bilaterally C7-T1: Negative IMPRESSION: 1. Negative for acute infarct. Mild chronic microvascular ischemic change in the white matter 2. Cervical spine study degraded by moderate motion. Cervical spondylosis is present with foraminal stenosis bilaterally at C3-4, C4-5, and C5-6 due to spurring. Electronically Signed   By: Marlan Palau M.D.   On: 11/13/2017 14:28   Mr Cervical Spine Wo Contrast  Result Date: 11/13/2017 CLINICAL DATA:  Focal neuro deficit, suspect stroke. Right arm numbness and weakness EXAM: MRI HEAD WITHOUT CONTRAST MRI CERVICAL SPINE WITHOUT CONTRAST TECHNIQUE: Multiplanar, multiecho pulse sequences of the brain and surrounding structures, and cervical spine, to include the craniocervical junction and cervicothoracic junction, were obtained without intravenous contrast. COMPARISON:  CT head 11/11/2017 FINDINGS: MRI HEAD FINDINGS Brain: Negative for acute infarct. Mild chronic microvascular ischemic change in the white matter. Negative for hemorrhage or mass. Ventricle size normal. Vascular: Normal arterial flow voids in Skull and upper cervical spine: Negative Sinuses/Orbits: Mild mucosal edema paranasal  sinuses.  Normal orbit Other: None MRI CERVICAL SPINE FINDINGS Alignment: Normal alignment Image quality degraded by moderate motion Vertebrae: Negative for fracture or mass Cord: Limited cord evaluation due to motion. No cord lesion identified. Posterior Fossa, vertebral arteries, paraspinal tissues: Negative Disc levels: C2-3: Negative C3-4: Moderate left foraminal encroachment due to facet hypertrophy and mild uncinate spurring C4-5 moderate foraminal stenosis bilaterally due to facet hypertrophy and mild uncinate spurring C5-6: Moderate foraminal encroachment bilaterally with mild spinal stenosis due to diffuse uncinate spur and facet hypertrophy. 1 cm cyst posterior to the facet on the right at C6-7 most likely a synovial cyst projecting into the muscles. C6-7: Mild foraminal narrowing bilaterally C7-T1: Negative IMPRESSION: 1. Negative for acute infarct. Mild chronic microvascular ischemic change in the white matter 2. Cervical spine study degraded by moderate motion. Cervical spondylosis is present with foraminal stenosis bilaterally at C3-4, C4-5, and C5-6 due to spurring. Electronically Signed   By: Marlan Palau M.D.   On: 11/13/2017 14:28  Dg Chest Port 1 View  Result Date: 11/12/2017 CLINICAL DATA:  Chest pain EXAM: PORTABLE CHEST 1 VIEW COMPARISON:  12/15/2016 FINDINGS: Normal heart size and mediastinal contours. There is no edema, consolidation, effusion, or pneumothorax. No acute osseous finding IMPRESSION: No evidence of active disease. Electronically Signed   By: Marnee Spring M.D.   On: 11/12/2017 13:24    Procedures Procedures (including critical care time)  Medications Ordered in ED Medications - No data to display   Initial Impression / Assessment and Plan / ED Course  I have reviewed the triage vital signs and the nursing notes.  Pertinent labs & imaging results that were available during my care of the patient were reviewed by me and considered in my medical decision  making (see chart for details).     Patient with left upper extremity paresthesias and pain.  MRI of brain and cervical spine overall reassuring.  Good blood flow to hand also.  Patient is an alcoholic.  Chest pain work-up reassuring.  Also has reported rectal bleeding.  Rectal exam is deferred by patient.  Hemoglobin reassuring.  We will follow-up with primary care doctor.  States he is homeless and does have a doctor up in [facility] but is hard to get up there.  Will have patient seen by social work.  Final Clinical Impressions(s) / ED Diagnoses   Final diagnoses:  Paresthesias  Gastrointestinal hemorrhage, unspecified gastrointestinal hemorrhage type    ED Discharge Orders    None       Benjiman Core, MD 11/13/17 1520

## 2017-11-13 NOTE — ED Notes (Signed)
Patient transported to MRI 

## 2017-11-13 NOTE — ED Triage Notes (Signed)
Pt c/o generalized chest pain and left arm pains that started last night. Pt also states that he noticed red blood stains on underwear last night as well

## 2018-01-07 ENCOUNTER — Emergency Department (HOSPITAL_COMMUNITY)
Admission: EM | Admit: 2018-01-07 | Discharge: 2018-01-07 | Disposition: A | Discharge status: Home / Self Care | Payer: 59 | Attending: Emergency Medicine | Admitting: Emergency Medicine

## 2018-01-07 ENCOUNTER — Encounter (HOSPITAL_COMMUNITY): Payer: Self-pay | Admitting: Emergency Medicine

## 2018-01-07 ENCOUNTER — Emergency Department (HOSPITAL_COMMUNITY): Payer: 59

## 2018-01-07 DIAGNOSIS — R0789 Other chest pain: Secondary | ICD-10-CM | POA: Insufficient documentation

## 2018-01-07 DIAGNOSIS — R079 Chest pain, unspecified: Secondary | ICD-10-CM

## 2018-01-07 HISTORY — DX: Homelessness: Z59.0

## 2018-01-07 HISTORY — DX: Homelessness unspecified: Z59.00

## 2018-01-07 LAB — BASIC METABOLIC PANEL
Anion gap: 16 — ABNORMAL HIGH (ref 5–15)
BUN: 7 mg/dL (ref 6–20)
CO2: 26 mmol/L (ref 22–32)
CREATININE: 0.98 mg/dL (ref 0.61–1.24)
Calcium: 8.7 mg/dL — ABNORMAL LOW (ref 8.9–10.3)
Chloride: 105 mmol/L (ref 98–111)
GFR calc non Af Amer: >60 mL/min (ref >60)
Glucose, Bld: 102 mg/dL — ABNORMAL HIGH (ref 70–99)
Potassium: 4.1 mmol/L (ref 3.5–5.1)
Sodium: 147 mmol/L — ABNORMAL HIGH (ref 135–145)

## 2018-01-07 LAB — ETHANOL: Alcohol, Ethyl (B): 215 mg/dL — ABNORMAL HIGH (ref <10)

## 2018-01-07 LAB — CBC
HCT: 46.2 % (ref 39.0–52.0)
Hemoglobin: 14.4 g/dL (ref 13.0–17.0)
MCH: 26.2 pg (ref 26.0–34.0)
MCHC: 31.2 g/dL (ref 30.0–36.0)
MCV: 84 fL (ref 80.0–100.0)
Platelets: 351 10*3/uL (ref 150–400)
RBC: 5.5 MIL/uL (ref 4.22–5.81)
RDW: 14.4 % (ref 11.5–15.5)
WBC: 9.3 10*3/uL (ref 4.0–10.5)
nRBC: 0 % (ref 0.0–0.2)

## 2018-01-07 LAB — I-STAT TROPONIN, ED
Troponin i, poc: 0 ng/mL (ref 0.00–0.08)
Troponin i, poc: 0 ng/mL (ref 0.00–0.08)

## 2018-01-07 MED ORDER — NITROGLYCERIN 2 % TD OINT
1.0000 [in_us] | TOPICAL_OINTMENT | Freq: Once | TRANSDERMAL | Status: AC
  Administered 2018-01-07: 1 [in_us] via TOPICAL
  Filled 2018-01-07: qty 1

## 2018-01-07 MED ORDER — ACETAMINOPHEN 325 MG PO TABS
650.0000 mg | ORAL_TABLET | Freq: Once | ORAL | Status: AC
  Administered 2018-01-07: 650 mg via ORAL
  Filled 2018-01-07: qty 2

## 2018-01-07 MED ORDER — SODIUM CHLORIDE 0.9 % IV BOLUS
1000.0000 mL | Freq: Once | INTRAVENOUS | Status: AC
  Administered 2018-01-07: 1000 mL via INTRAVENOUS

## 2018-01-07 NOTE — ED Provider Notes (Addendum)
[facility] EMERGENCY DEPARTMENT Provider Note   CSN: [id number] Arrival date & time: 01/07/18  0446     History   Chief Complaint Chief Complaint  Patient presents with  . Chest Pain    HPI Derek [name] is a 57 y.o. male.  HPI  This is a 57 year old male with history of alcohol abuse, hypertension who presents with chest pain.  Patient reports onset of aching left-sided chest pain that started at midnight.  It radiates into his left arm.  He reports some numbness sensation.  Denies shortness of breath or nausea.  Nothing seems to make the pain better or worse.  Currently his pain is a 3.  He states improvement of pain after he was given nitroglycerin by EMS.  No known history of heart disease.  Does have a history of hypertension.  Formally was morbidly obese and is status post gastric bypass.  Denies any abdominal pain, nausea, vomiting, diarrhea.  Denies any recent fevers or cough.  Does report drinking yesterday.  Past Medical History:  Diagnosis Date  . Alcohol abuse   . Depression   . Essential hypertension 11/08/2015  . Homelessness   . Hypertension   . Obesity (BMI 30.0-34.9) 11/08/2015  . Sinus bradycardia 11/08/2015    Patient Active Problem List   Diagnosis Date Noted  . 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 Major depressive disorder, recurrent episode (HCC) 12/19/2016  . Alcohol use disorder, severe, dependence (HCC) 12/19/2016  . Essential hypertension 11/08/2015  . Obesity (BMI 30.0-34.9) 11/08/2015  . Sinus bradycardia 11/08/2015    Past Surgical History:  Procedure Laterality Date  . CHOLECYSTECTOMY    . GASTRIC BYPASS     2006 - done at [facility]  . ROTATOR CUFF REPAIR  2017  . ROUX-EN-Y PROCEDURE  2007  . SHOULDER ARTHROSCOPY WITH SUBACROMIAL DECOMPRESSION Right 04/17/2015   Procedure: SHOULDER ARTHROSCOPY ROTATOR CUFF DEBRIDEMENT WITH SUBACROMIAL DECOMPRESSION;  Surgeon: Jones Broom, MD;   Location: [facility];  Service: Orthopedics;  Laterality: Right;  . TONSILLECTOMY    . wisdom teeth extractions          Home Medications    Prior to Admission medications   Medication Sig Start Date End Date Taking? Authorizing Provider  losartan (COZAAR) 25 MG tablet Take 25 mg by mouth daily.   Yes [provider]  buPROPion (WELLBUTRIN XL) 150 MG 24 hr tablet Take 1 tablet (150 mg total) daily by mouth. Patient not taking: Reported on 01/07/2018 12/23/16   Oneta Rack, NP  elvitegravir-cobicistat-emtricitabine-tenofovir (GENVOYA) 150-150-200-10 MG TABS tablet Take 1 tablet by mouth daily with breakfast. Patient not taking: Reported on 11/13/2017 05/29/17   Benjiman Core, MD  FLUoxetine (PROZAC) 40 MG capsule Take 1 capsule (40 mg total) daily by mouth. Patient not taking: Reported on 01/07/2018 12/23/16   Oneta Rack, NP  gabapentin (NEURONTIN) 100 MG capsule Take 1 capsule (100 mg total) 3 (three) times daily by mouth. Patient not taking: Reported on 01/07/2018 12/22/16   Oneta Rack, NP    Family History Family History  Problem Relation Age of Onset  . Cancer Mother   . Peripheral Artery Disease Father   . Heart attack Maternal Grandfather     Social History Social History   Tobacco Use  . Smoking status: Never Smoker  . Smokeless tobacco: Never Used  Substance Use Topics  . Alcohol use: Yes  . Drug use: Yes    Types: Cocaine  Allergies   Patient has no known allergies.   Review of Systems Review of Systems  Constitutional: Negative for fever.  Respiratory: Negative for shortness of breath.   Cardiovascular: Positive for chest pain. Negative for leg swelling.  Gastrointestinal: Negative for abdominal pain, nausea and vomiting.  Genitourinary: Negative for dysuria.  All other systems reviewed and are negative.    Physical Exam Updated Vital Signs BP (!) 152/84 (BP Location: Left Arm)   Pulse 78   Temp 98.3 F  (36.8 C) (Oral)   Resp 16   Ht 1.854 m (6\' 1" )   Wt 106.6 kg   SpO2 98%   BMI 31.00 kg/m   Physical Exam  Constitutional: He is oriented to person, place, and time. He appears well-developed and well-nourished. No distress.  HENT:  Head: Normocephalic and atraumatic.  Eyes: Pupils are equal, round, and reactive to light.  Cardiovascular: Normal rate, regular rhythm, normal heart sounds and normal pulses.  No murmur heard. Pulmonary/Chest: Effort normal and breath sounds normal. No accessory muscle usage. No respiratory distress. He has no wheezes.  Abdominal: Soft. Bowel sounds are normal. There is no tenderness. There is no rebound.  Musculoskeletal: He exhibits no edema.       Right lower leg: He exhibits no tenderness and no edema.       Left lower leg: He exhibits no tenderness and no edema.  Lymphadenopathy:    He has no cervical adenopathy.  Neurological: He is alert and oriented to person, place, and time.  Skin: Skin is warm and dry.  Psychiatric: He has a normal mood and affect.  Nursing note and vitals reviewed.    ED Treatments / Results  Labs (all labs ordered are listed, but only abnormal results are displayed) Labs Reviewed  BASIC METABOLIC PANEL - Abnormal; Notable for the following components:      Result Value   Sodium 147 (*)    Glucose, Bld 102 (*)    Calcium 8.7 (*)    Anion gap 16 (*)    All other components within normal limits  ETHANOL - Abnormal; Notable for the following components:   Alcohol, Ethyl (B) 215 (*)    All other components within normal limits  CBC  I-STAT TROPONIN, ED    EKG EKG Interpretation  Date/Time:  Wednesday January 07 2018 05:01:21 EST Ventricular Rate:  77 PR Interval:    QRS Duration: 74 QT Interval:  652 QTC Calculation: 739 R Axis:   -73 Text Interpretation:  Sinus rhythm Short PR interval Left anterior fascicular block Abnormal R-wave progression, late transition Borderline T abnormalities, anterior leads  Prolonged QT interval Confirmed by Ross MarcusHorton, Keli Buehner ([id number]) on 01/07/2018 5:04:30 AM Also confirmed by Ross MarcusHorton, Liba Hulsey ([id number]), editor Barbette Hairassel, Kerry [phone])  on 01/07/2018 7:09:01 AM   Radiology Dg Chest 2 View  Result Date: 01/07/2018 CLINICAL DATA:  57 year old male with chest pain EXAM: CHEST - 2 VIEW COMPARISON:  .  Chest radiograph dated 11/13/2017 FINDINGS: The heart size and mediastinal contours are within normal limits. Both lungs are clear. The visualized skeletal structures are unremarkable. IMPRESSION: No active cardiopulmonary disease. Electronically Signed   By: Elgie CollardArash  Radparvar M.D.   On: 01/07/2018 06:20    Procedures Procedures (including critical care time)  Medications Ordered in ED Medications  sodium chloride 0.9 % bolus 1,000 mL (1,000 mLs Intravenous New Bag/Given 01/07/18 0617)  nitroGLYCERIN (NITROGLYN) 2 % ointment 1 inch (1 inch Topical Given 01/07/18 0522)     Initial Impression /  Assessment and Plan / ED Course  I have reviewed the triage vital signs and the nursing notes.  Pertinent labs & imaging results that were available during my care of the patient were reviewed by me and considered in my medical decision making (see chart for details).     Patient presents with atypical chest pain.  He is overall nontoxic-appearing and vital signs are reassuring.  Risk factors include hypertension and age.  Story is atypical.  He has been seen before.  He also endorses drinking.  Blood alcohol level is 215.  EKG shows no signs of acute ischemia.  Initial troponin is negative.  Will repeat troponin at 8 AM.  Chest x-ray shows no evidence of pneumothorax or pneumonia.  Heart score is 2 for age and risk factors.  If troponin is negative, patient can follow-up with cardiology as an outpatient.  Of note, patient mildly hypernatremic with an anion gap of 16.  Likely related to chronic alcohol use.  He was given 1 L of fluids.  Final Clinical Impressions(s) / ED Diagnoses    Final diagnoses:  None    ED Discharge Orders    None       Cassie Shedlock, Mayer Masker, MD 01/07/18 1610    Shon Baton, MD 01/07/18 [phone]

## 2018-01-07 NOTE — ED Provider Notes (Signed)
I assumed care of this patient from Dr. Wilkie AyeHorton at 730 am.  Please see their note for further details of Hx, PE.  Briefly patient is a 57 y.o. male who presented with chest pain, ETOH intoxication.   Current plan is to delta troponin and if negative d/c to home, HEART score of 2. Workup thus far is unremarkable. Patient awake and eating food currently.  Repeat troponin within normal limits.  Recommend follow-up with cardiology outpatient discharge in ED good condition.  Told to return to the ED if symptoms worsen.     Derek [name], Derek Scallon, DO 01/07/18 [phone]

## 2018-01-07 NOTE — ED Triage Notes (Addendum)
Patient arrived with EMS from street ( homeless) reports left chest pain radiating to left upper arm with mild SOB this evening , no emesis or diaphoresis , pt. added bilateral hip pain , denies injury/ambulatory . Patient endorses heavy ETOH consumption yesterday . He received ASA 324 mg and 2 NTG sl by EMS prior to arrival .

## 2018-01-20 ENCOUNTER — Other Ambulatory Visit: Payer: Self-pay

## 2018-01-20 ENCOUNTER — Encounter (HOSPITAL_COMMUNITY): Payer: Self-pay | Admitting: *Deleted

## 2018-01-20 ENCOUNTER — Emergency Department (HOSPITAL_COMMUNITY)
Admission: EM | Admit: 2018-01-20 | Discharge: 2018-01-21 | Disposition: A | Discharge status: Other Acute Inpatient Hospital | Payer: 59 | Attending: Emergency Medicine | Admitting: Emergency Medicine

## 2018-01-20 ENCOUNTER — Ambulatory Visit (HOSPITAL_COMMUNITY)
Admission: RE | Admit: 2018-01-20 | Discharge: 2018-01-20 | Disposition: A | Discharge status: Home / Self Care | Payer: 59 | Source: Home / Self Care | Attending: Psychiatry | Admitting: Psychiatry

## 2018-01-20 DIAGNOSIS — Z590 Homelessness: Secondary | ICD-10-CM | POA: Insufficient documentation

## 2018-01-20 DIAGNOSIS — F332 Major depressive disorder, recurrent severe without psychotic features: Secondary | ICD-10-CM | POA: Insufficient documentation

## 2018-01-20 DIAGNOSIS — F10220 Alcohol dependence with intoxication, uncomplicated: Secondary | ICD-10-CM | POA: Insufficient documentation

## 2018-01-20 DIAGNOSIS — Z8249 Family history of ischemic heart disease and other diseases of the circulatory system: Secondary | ICD-10-CM

## 2018-01-20 DIAGNOSIS — I10 Essential (primary) hypertension: Secondary | ICD-10-CM

## 2018-01-20 DIAGNOSIS — Z9884 Bariatric surgery status: Secondary | ICD-10-CM | POA: Insufficient documentation

## 2018-01-20 DIAGNOSIS — R45851 Suicidal ideations: Secondary | ICD-10-CM | POA: Diagnosis not present

## 2018-01-20 DIAGNOSIS — Z008 Encounter for other general examination: Secondary | ICD-10-CM | POA: Diagnosis present

## 2018-01-20 DIAGNOSIS — Z79899 Other long term (current) drug therapy: Secondary | ICD-10-CM | POA: Diagnosis not present
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
## 2018-01-20 NOTE — ED Triage Notes (Signed)
Pt says he is "not sure about living and I do not care if I wake up". No specific plan. Reports ETOH use, last drink about 2 hours ago.

## 2018-01-20 NOTE — H&P (Signed)
Behavioral Health Medical Screening Exam  Derek ConchJames Freilich is an 57 y.o. male.  Total Time spent with patient: 15 minutes  Psychiatric Specialty Exam: Physical Exam  Constitutional: He is oriented to person, place, and time. He appears well-developed and well-nourished. No distress.  HENT:  Head: Normocephalic and atraumatic.  Right Ear: External ear normal.  Left Ear: External ear normal.  Eyes: Pupils are equal, round, and reactive to light. Right eye exhibits no discharge. Left eye exhibits no discharge.  Respiratory: Effort normal. No respiratory distress.  Musculoskeletal: Normal range of motion.  Neurological: He is alert and oriented to person, place, and time.  Skin: Skin is warm and dry. He is not diaphoretic.  Psychiatric: His speech is normal. His mood appears anxious. He is not withdrawn and not actively hallucinating. Thought content is not paranoid and not delusional. He exhibits a depressed mood. He expresses suicidal ideation. He expresses no homicidal ideation. He expresses no suicidal plans.    Review of Systems  Constitutional: Negative for chills, fever and weight loss.  Respiratory: Negative for cough and shortness of breath.   Cardiovascular: Positive for chest pain.  Gastrointestinal: Negative for nausea and vomiting.  Psychiatric/Behavioral: Positive for depression, hallucinations, substance abuse and suicidal ideas. Negative for memory loss. The patient is nervous/anxious and has insomnia.   All other systems reviewed and are negative.   Blood pressure 131/75, pulse 97, temperature 98.9 F (37.2 C), resp. rate 18, SpO2 98 %.There is no height or weight on file to calculate BMI.  General Appearance: Disheveled  Eye Contact:  Fair  Speech:  Clear and Coherent and Normal Rate  Volume:  Normal  Mood:  Anxious, Depressed, Dysphoric, Hopeless, Irritable and Worthless  Affect:  Congruent and Depressed  Thought Process:  Coherent, Goal Directed and Descriptions of  Associations: Intact  Orientation:  Full (Time, Place, and Person)  Thought Content:  Logical and Hallucinations: None  Suicidal Thoughts:  Yes.  without intent/plan  Homicidal Thoughts:  No  Memory:  Immediate;   Good  Judgement:  Impaired  Insight:  Fair  Psychomotor Activity:  Normal  Concentration: Concentration: Fair and Attention Span: Fair  Recall:  Good  Fund of Knowledge:Good  Language: Good  Akathisia:  No  Handed:  Right  AIMS (if indicated):     Assets:  Communication Skills Desire for Improvement Intimacy Leisure Time Physical Health  Sleep:       Musculoskeletal: Strength & Muscle Tone: within normal limits Gait & Station: normal   Blood pressure 131/75, pulse 97, temperature 98.9 F (37.2 C), resp. rate 18, SpO2 98 %.  Recommendations:  Based on my evaluation the patient does not appear to have an emergency medical condition.  Patient initially reported chest pain of 4/10, states [facility]. Recommend psychiatric Inpatient admission when medically cleared.  Jackelyn PolingJason A Ozzie Remmers, NP 01/20/2018, 11:15 PM

## 2018-01-21 ENCOUNTER — Encounter (HOSPITAL_COMMUNITY): Payer: Self-pay

## 2018-01-21 ENCOUNTER — Inpatient Hospital Stay (HOSPITAL_COMMUNITY)
Admission: AD | Admit: 2018-01-21 | Discharge: 2018-01-26 | DRG: 897 | Disposition: A | Discharge status: Home / Self Care | Payer: 59 | Source: Intra-hospital | Attending: Psychiatry | Admitting: Psychiatry

## 2018-01-21 ENCOUNTER — Other Ambulatory Visit: Payer: Self-pay

## 2018-01-21 DIAGNOSIS — F333 Major depressive disorder, recurrent, severe with psychotic symptoms: Secondary | ICD-10-CM | POA: Diagnosis present

## 2018-01-21 DIAGNOSIS — F10220 Alcohol dependence with intoxication, uncomplicated: Secondary | ICD-10-CM | POA: Diagnosis not present

## 2018-01-21 DIAGNOSIS — G4700 Insomnia, unspecified: Secondary | ICD-10-CM | POA: Diagnosis present

## 2018-01-21 DIAGNOSIS — F332 Major depressive disorder, recurrent severe without psychotic features: Secondary | ICD-10-CM | POA: Diagnosis present

## 2018-01-21 DIAGNOSIS — F419 Anxiety disorder, unspecified: Secondary | ICD-10-CM | POA: Diagnosis not present

## 2018-01-21 DIAGNOSIS — F1020 Alcohol dependence, uncomplicated: Secondary | ICD-10-CM | POA: Diagnosis present

## 2018-01-21 DIAGNOSIS — Z818 Family history of other mental and behavioral disorders: Secondary | ICD-10-CM

## 2018-01-21 DIAGNOSIS — Z9884 Bariatric surgery status: Secondary | ICD-10-CM | POA: Diagnosis not present

## 2018-01-21 DIAGNOSIS — Z814 Family history of other substance abuse and dependence: Secondary | ICD-10-CM | POA: Diagnosis not present

## 2018-01-21 DIAGNOSIS — Z23 Encounter for immunization: Secondary | ICD-10-CM | POA: Diagnosis not present

## 2018-01-21 DIAGNOSIS — Z8249 Family history of ischemic heart disease and other diseases of the circulatory system: Secondary | ICD-10-CM | POA: Diagnosis not present

## 2018-01-21 HISTORY — DX: Anxiety disorder, unspecified: F41.9

## 2018-01-21 LAB — CBC WITH DIFFERENTIAL/PLATELET
Abs Immature Granulocytes: 0.01 10*3/uL (ref 0.00–0.07)
Basophils Absolute: 0.1 10*3/uL (ref 0.0–0.1)
Basophils Relative: 1 %
Eosinophils Absolute: 0.1 10*3/uL (ref 0.0–0.5)
Eosinophils Relative: 1 %
HCT: 41.2 % (ref 39.0–52.0)
Hemoglobin: 13.2 g/dL (ref 13.0–17.0)
Immature Granulocytes: 0 %
Lymphocytes Relative: 40 %
Lymphs Abs: 2.6 10*3/uL (ref 0.7–4.0)
MCH: 27 pg (ref 26.0–34.0)
MCHC: 32 g/dL (ref 30.0–36.0)
MCV: 84.4 fL (ref 80.0–100.0)
Monocytes Absolute: 0.8 10*3/uL (ref 0.1–1.0)
Monocytes Relative: 12 %
Neutro Abs: 3 10*3/uL (ref 1.7–7.7)
Neutrophils Relative %: 46 %
PLATELETS: 205 10*3/uL (ref 150–400)
RBC: 4.88 MIL/uL (ref 4.22–5.81)
RDW: 15 % (ref 11.5–15.5)
WBC: 6.4 10*3/uL (ref 4.0–10.5)
nRBC: 0 % (ref 0.0–0.2)

## 2018-01-21 LAB — COMPREHENSIVE METABOLIC PANEL
ALT: 35 U/L (ref 0–44)
AST: 48 U/L — AB (ref 15–41)
Albumin: 4.2 g/dL (ref 3.5–5.0)
Alkaline Phosphatase: 91 U/L (ref 38–126)
Anion gap: 15 (ref 5–15)
BUN: 10 mg/dL (ref 6–20)
CO2: 23 mmol/L (ref 22–32)
Calcium: 8.7 mg/dL — ABNORMAL LOW (ref 8.9–10.3)
Chloride: 103 mmol/L (ref 98–111)
Creatinine, Ser: 1.03 mg/dL (ref 0.61–1.24)
GFR calc Af Amer: >60 mL/min (ref >60)
GFR calc non Af Amer: >60 mL/min (ref >60)
GLUCOSE: 120 mg/dL — AB (ref 70–99)
Potassium: 3.6 mmol/L (ref 3.5–5.1)
Sodium: 141 mmol/L (ref 135–145)
Total Bilirubin: 0.6 mg/dL (ref 0.3–1.2)
Total Protein: 6.7 g/dL (ref 6.5–8.1)

## 2018-01-21 LAB — RAPID URINE DRUG SCREEN, HOSP PERFORMED
Amphetamines: NOT DETECTED
BARBITURATES: NOT DETECTED
Benzodiazepines: NOT DETECTED
Cocaine: NOT DETECTED
Opiates: NOT DETECTED
Tetrahydrocannabinol: NOT DETECTED

## 2018-01-21 LAB — ETHANOL
Alcohol, Ethyl (B): 280 mg/dL — ABNORMAL HIGH (ref <10)
Alcohol, Ethyl (B): 172 mg/dL — ABNORMAL HIGH (ref <10)

## 2018-01-21 MED ORDER — HYDROXYZINE HCL 25 MG PO TABS
25.0000 mg | ORAL_TABLET | Freq: Four times a day (QID) | ORAL | Status: AC | PRN
  Administered 2018-01-21 – 2018-01-23 (×4): 25 mg via ORAL
  Filled 2018-01-21 (×2): qty 1

## 2018-01-21 MED ORDER — INFLUENZA VAC SPLIT QUAD 0.5 ML IM SUSY
0.5000 mL | PREFILLED_SYRINGE | INTRAMUSCULAR | Status: AC
  Administered 2018-01-22: 0.5 mL via INTRAMUSCULAR
  Filled 2018-01-21: qty 0.5

## 2018-01-21 MED ORDER — LOPERAMIDE HCL 2 MG PO CAPS: 2.0000 mg | ORAL_CAPSULE | ORAL | Status: AC | PRN

## 2018-01-21 MED ORDER — FOLIC ACID 1 MG PO TABS
1.0000 mg | ORAL_TABLET | Freq: Every day | ORAL | Status: DC
  Administered 2018-01-21 – 2018-01-26 (×6): 1 mg via ORAL
  Filled 2018-01-21 (×9): qty 1

## 2018-01-21 MED ORDER — LORAZEPAM 2 MG/ML IJ SOLN: 0.0000 mg | Freq: Two times a day (BID) | INTRAMUSCULAR | Status: DC

## 2018-01-21 MED ORDER — THIAMINE HCL 100 MG/ML IJ SOLN: 100.0000 mg | Freq: Every day | INTRAMUSCULAR | Status: DC

## 2018-01-21 MED ORDER — LORAZEPAM 1 MG PO TABS
1.0000 mg | ORAL_TABLET | Freq: Four times a day (QID) | ORAL | Status: DC
  Administered 2018-01-21: 1 mg via ORAL
  Filled 2018-01-21: qty 1

## 2018-01-21 MED ORDER — VITAMIN B-1 100 MG PO TABS
100.0000 mg | ORAL_TABLET | Freq: Every day | ORAL | Status: DC
  Administered 2018-01-21: 100 mg via ORAL
  Filled 2018-01-21: qty 1

## 2018-01-21 MED ORDER — VITAMIN B-1 100 MG PO TABS
100.0000 mg | ORAL_TABLET | Freq: Every day | ORAL | Status: DC
  Administered 2018-01-22 – 2018-01-26 (×5): 100 mg via ORAL
  Filled 2018-01-21 (×6): qty 1

## 2018-01-21 MED ORDER — GABAPENTIN 100 MG PO CAPS
100.0000 mg | ORAL_CAPSULE | Freq: Three times a day (TID) | ORAL | Status: DC
  Administered 2018-01-21 – 2018-01-26 (×14): 100 mg via ORAL
  Filled 2018-01-21 (×20): qty 1

## 2018-01-21 MED ORDER — LORAZEPAM 1 MG PO TABS: 1.0000 mg | ORAL_TABLET | Freq: Three times a day (TID) | ORAL | Status: DC

## 2018-01-21 MED ORDER — LORAZEPAM 1 MG PO TABS
1.0000 mg | ORAL_TABLET | Freq: Four times a day (QID) | ORAL | Status: AC | PRN
  Administered 2018-01-21: 1 mg via ORAL
  Filled 2018-01-21: qty 1

## 2018-01-21 MED ORDER — LORAZEPAM 1 MG PO TABS: 0.0000 mg | ORAL_TABLET | Freq: Two times a day (BID) | ORAL | Status: DC

## 2018-01-21 MED ORDER — ADULT MULTIVITAMIN W/MINERALS CH
1.0000 | ORAL_TABLET | Freq: Every day | ORAL | Status: DC
  Administered 2018-01-21 – 2018-01-26 (×6): 1 via ORAL
  Filled 2018-01-21 (×8): qty 1

## 2018-01-21 MED ORDER — LORAZEPAM 1 MG PO TABS: 1.0000 mg | ORAL_TABLET | Freq: Two times a day (BID) | ORAL | Status: DC

## 2018-01-21 MED ORDER — MAGNESIUM HYDROXIDE 400 MG/5ML PO SUSP: 30.0000 mL | Freq: Every day | ORAL | Status: DC | PRN

## 2018-01-21 MED ORDER — LORAZEPAM 1 MG PO TABS
0.0000 mg | ORAL_TABLET | Freq: Four times a day (QID) | ORAL | Status: DC
  Administered 2018-01-21 (×2): 1 mg via ORAL
  Filled 2018-01-21: qty 1
  Filled 2018-01-21: qty 2

## 2018-01-21 MED ORDER — ONDANSETRON 4 MG PO TBDP: 4.0000 mg | ORAL_TABLET | Freq: Four times a day (QID) | ORAL | Status: AC | PRN

## 2018-01-21 MED ORDER — LORAZEPAM 2 MG/ML IJ SOLN: 0.0000 mg | Freq: Four times a day (QID) | INTRAMUSCULAR | Status: DC

## 2018-01-21 MED ORDER — FLUOXETINE HCL 20 MG PO CAPS: 40.0000 mg | ORAL_CAPSULE | Freq: Every day | ORAL | Status: DC

## 2018-01-21 MED ORDER — ACAMPROSATE CALCIUM 333 MG PO TBEC
666.0000 mg | DELAYED_RELEASE_TABLET | Freq: Two times a day (BID) | ORAL | Status: DC
  Administered 2018-01-21 – 2018-01-23 (×4): 666 mg via ORAL
  Filled 2018-01-21 (×7): qty 2

## 2018-01-21 MED ORDER — LORAZEPAM 1 MG PO TABS: 1.0000 mg | ORAL_TABLET | Freq: Every day | ORAL | Status: DC

## 2018-01-21 MED ORDER — ALUM & MAG HYDROXIDE-SIMETH 200-200-20 MG/5ML PO SUSP: 30.0000 mL | ORAL | Status: DC | PRN

## 2018-01-21 MED ORDER — BUPROPION HCL ER (XL) 150 MG PO TB24: 150.0000 mg | ORAL_TABLET | Freq: Every day | ORAL | Status: DC

## 2018-01-21 MED ORDER — FLUOXETINE HCL 20 MG PO CAPS
20.0000 mg | ORAL_CAPSULE | Freq: Every day | ORAL | Status: DC
  Administered 2018-01-22 – 2018-01-23 (×2): 20 mg via ORAL
  Filled 2018-01-21 (×3): qty 1

## 2018-01-21 MED ORDER — ACETAMINOPHEN 325 MG PO TABS: 650.0000 mg | ORAL_TABLET | Freq: Four times a day (QID) | ORAL | Status: DC | PRN

## 2018-01-21 MED ORDER — TRAZODONE HCL 50 MG PO TABS
50.0000 mg | ORAL_TABLET | Freq: Every evening | ORAL | Status: DC | PRN
  Administered 2018-01-21: 50 mg via ORAL

## 2018-01-21 NOTE — ED Notes (Signed)
[facility] here for transport. Belongings sent with [facility] and patient records sent

## 2018-01-21 NOTE — ED Notes (Signed)
[facility] signed voluntary form

## 2018-01-21 NOTE — Progress Notes (Signed)
Nursing Progress Note: 7p-7a D: Pt currently presents with a sad/depressed/tremulous/anxious affect and behavior. Pt states "I Starting to not feel so well. I can't even write because theyshake. But I did this to myself" Interacting appropriately with the milieu. Pt reports good sleep during the previous night with current medication regimen. Pt did attend wrap-up group.  A: Pt provided with medications per providers orders. Pt's labs and vitals were monitored throughout the night. Pt supported emotionally and encouraged to express concerns and questions. Pt educated on medications.  R: Pt's safety ensured with 15 minute and environmental checks. Pt currently denies SI, HI, and AVH. Pt verbally contracts to seek staff if SI,HI, or AVH occurs and to consult with staff before acting on any harmful thoughts. Will continue to monitor.

## 2018-01-21 NOTE — Discharge Instructions (Addendum)
[facility]

## 2018-01-21 NOTE — Progress Notes (Deleted)
Recreation Therapy Notes  Date: 12.18.19 Time: 0930 Location: 300 Hall Dayroom  Group Topic: Stress Management  Goal Area(s) Addresses:  Patient will verbalize importance of using healthy stress management.  Patient will identify positive emotions associated with healthy stress management.   Intervention: Stress Management  Activity : Progressive Muscle Relaxation.  LRT introduced the stress management technique of progressive muscle relaxation.  LRT read a script guiding patients through the process of tensing and releasing each muscle one at a time.  Education:  Stress Management, Discharge Planning.   Education Outcome: Acknowledges edcuation/In group clarification offered/Needs additional education  Clinical Observations/Feedback: Pt did not attend group .    Caroll RancherMarjette Grover Robinson, LRT/CTRS         Caroll RancherLindsay, Brystal Kildow A 01/21/2018 11:47 AM

## 2018-01-21 NOTE — ED Provider Notes (Signed)
[facility]-EMERGENCY DEPT Provider Note   CSN: [id number] Arrival date & time: 01/20/18  2317     History   Chief Complaint No chief complaint on file.   HPI Derek [name] is a 57 y.o. male.  Patient with a history of alcohol abuse/dependence, HTN presents from [facility] where he was seen for alcohol intoxication and suicidal thoughts, specifically "I don't care if I wake up in the morning". He denies specific plan. He was sent here for medical clearance. He has no physical complaints.   The history is provided by the patient. No language interpreter was used.    Past Medical History:  Diagnosis Date  . Alcohol abuse   . Depression   . Essential hypertension 11/08/2015  . Homelessness   . Hypertension   . Obesity (BMI 30.0-34.9) 11/08/2015  . Sinus bradycardia 11/08/2015    Patient Active Problem List   Diagnosis Date Noted  . 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 Major depressive disorder, recurrent episode (HCC) 12/19/2016  . Alcohol use disorder, severe, dependence (HCC) 12/19/2016  . Essential hypertension 11/08/2015  . Obesity (BMI 30.0-34.9) 11/08/2015  . Sinus bradycardia 11/08/2015    Past Surgical History:  Procedure Laterality Date  . CHOLECYSTECTOMY    . GASTRIC BYPASS     2006 - done at [facility]  . ROTATOR CUFF REPAIR  2017  . ROUX-EN-Y PROCEDURE  2007  . SHOULDER ARTHROSCOPY WITH SUBACROMIAL DECOMPRESSION Right 04/17/2015   Procedure: SHOULDER ARTHROSCOPY ROTATOR CUFF DEBRIDEMENT WITH SUBACROMIAL DECOMPRESSION;  Surgeon: Jones Broom, MD;  Location: [facility];  Service: Orthopedics;  Laterality: Right;  . TONSILLECTOMY    . wisdom teeth extractions          Home Medications    Prior to Admission medications   Medication Sig Start Date End Date Taking? Authorizing Provider  ibuprofen (ADVIL,MOTRIN) 200 MG tablet Take 400 mg by mouth every 6 (six) hours as needed for moderate  pain.   Yes [provider]  Multiple Vitamin (MULTIVITAMIN WITH MINERALS) TABS tablet Take 1 tablet by mouth daily.   Yes [provider]  buPROPion (WELLBUTRIN XL) 150 MG 24 hr tablet Take 1 tablet (150 mg total) daily by mouth. Patient not taking: Reported on 01/07/2018 12/23/16   Oneta Rack, NP  elvitegravir-cobicistat-emtricitabine-tenofovir (GENVOYA) 150-150-200-10 MG TABS tablet Take 1 tablet by mouth daily with breakfast. Patient not taking: Reported on 11/13/2017 05/29/17   Benjiman Core, MD  FLUoxetine (PROZAC) 40 MG capsule Take 1 capsule (40 mg total) daily by mouth. Patient not taking: Reported on 01/07/2018 12/23/16   Oneta Rack, NP  gabapentin (NEURONTIN) 100 MG capsule Take 1 capsule (100 mg total) 3 (three) times daily by mouth. Patient not taking: Reported on 01/07/2018 12/22/16   Oneta Rack, NP    Family History Family History  Problem Relation Age of Onset  . Cancer Mother   . Peripheral Artery Disease Father   . Heart attack Maternal Grandfather     Social History Social History   Tobacco Use  . Smoking status: Never Smoker  . Smokeless tobacco: Never Used  Substance Use Topics  . Alcohol use: Yes  . Drug use: Yes    Types: Cocaine     Allergies   Patient has no known allergies.   Review of Systems Review of Systems  Constitutional: Negative for chills and fever.  HENT: Negative.   Respiratory: Negative.   Cardiovascular: Negative.   Gastrointestinal:  Negative.   Musculoskeletal: Negative.   Skin: Negative.   Neurological: Negative.   Psychiatric/Behavioral: Positive for dysphoric mood and suicidal ideas.     Physical Exam Updated Vital Signs BP 127/77   Pulse 72   Temp 97.9 F (36.6 C) (Oral)   Resp 18   SpO2 94%   Physical Exam Vitals signs and nursing note reviewed.  Constitutional:      Appearance: He is well-developed.     Comments: Acutely intoxicated but coherent, cooperative, pleasant.    HENT:     Head: Normocephalic.  Neck:     Musculoskeletal: Normal range of motion and neck supple.  Cardiovascular:     Rate and Rhythm: Normal rate and regular rhythm.     Heart sounds: No murmur.  Pulmonary:     Effort: Pulmonary effort is normal.     Breath sounds: Normal breath sounds. No wheezing, rhonchi or rales.  Abdominal:     General: Bowel sounds are normal.     Palpations: Abdomen is soft.     Tenderness: There is no abdominal tenderness. There is no guarding or rebound.  Musculoskeletal: Normal range of motion.  Skin:    General: Skin is warm and dry.     Findings: No rash.  Neurological:     General: No focal deficit present.     Mental Status: He is alert and oriented to person, place, and time.      ED Treatments / Results  Labs (all labs ordered are listed, but only abnormal results are displayed) Labs Reviewed  COMPREHENSIVE METABOLIC PANEL - Abnormal; Notable for the following components:      Result Value   Glucose, Bld 120 (*)    Calcium 8.7 (*)    AST 48 (*)    All other components within normal limits  ETHANOL - Abnormal; Notable for the following components:   Alcohol, Ethyl (B) 280 (*)    All other components within normal limits  RAPID URINE DRUG SCREEN, HOSP PERFORMED  CBC WITH DIFFERENTIAL/PLATELET    EKG None  Radiology No results found.  Procedures Procedures (including critical care time)  Medications Ordered in ED Medications - No data to display   Initial Impression / Assessment and Plan / ED Course  I have reviewed the triage vital signs and the nursing notes.  Pertinent labs & imaging results that were available during my care of the patient were reviewed by me and considered in my medical decision making (see chart for details).     Patient [facility]. [facility] by friends who were concerned for his safety as he was making statements referencing suicidal ideations.   The patient has  been resting comfortably. No change in VS - specifically no tachycardia or HTN. No tremors. CIWA protocol in place, however, no Ativan was required.   [facility], the [facility] for further inpatient care when alcohol level is less than 200.   6:40 - repeat alcohol is 178. Discussed with TCU RN, Reita ClicheBobby, [facility] and [facility].   Final Clinical Impressions(s) / ED Diagnoses   Final diagnoses:  None   1. Alcohol dependence 2. Passive suicidal ideations  ED Discharge Orders    None       Elpidio AnisUpstill, Emmelia Holdsworth, Cordelia Poche-C 01/21/18 [id number]    Dione BoozeGlick, David, MD 01/21/18 [phone]

## 2018-01-21 NOTE — ED Notes (Signed)
ED TO INPATIENT HANDOFF REPORT  Name/Age/Gender Derek [name] 57 y.o. male  Code Status    Code Status Orders  (From admission, onward)         Start     Ordered   01/21/18 0223  Full code  Continuous     01/21/18 0222        Code Status History    Date Active Date Inactive Code Status Order ID Comments User Context   05/29/2017 2334 05/30/2017 1736 Full Code [id number]  Benjiman Core, MD ED   12/20/2016 0008 12/22/2016 1644 Full Code [id number]  Jackelyn Poling, NP Inpatient   12/19/2016 0402 12/19/2016 2315 Full Code [id number]  Palumbo, April, MD ED      Home/SNF/Other Home  Chief Complaint Medical Clearance  Level of Care/Admitting Diagnosis ED Disposition    ED Disposition Condition Comment   Admit to [facility]  Patient has been medically cleared and is in stable condition, and is being admitted to a [facility] inpatient behavioral health hospital/unit.       Medical History Past Medical History:  Diagnosis Date  . Alcohol abuse   . Depression   . Essential hypertension 11/08/2015  . Homelessness   . Hypertension   . Obesity (BMI 30.0-34.9) 11/08/2015  . Sinus bradycardia 11/08/2015    Allergies No Known Allergies  IV Location/Drains/Wounds Patient Lines/Drains/Airways Status   Active Line/Drains/Airways    Name:   Placement date:   Placement time:   Site:   Days:   Incision (Closed) 04/17/15 Shoulder Right   04/17/15    1334     1010          Labs/Imaging Results for orders placed or performed during the hospital encounter of 01/20/18 (from the past 48 hour(s))  Comprehensive metabolic panel     Status: Abnormal   Collection Time: 01/21/18 12:32 AM  Result Value Ref Range   Sodium 141 135 - 145 mmol/L   Potassium 3.6 3.5 - 5.1 mmol/L   Chloride 103 98 - 111 mmol/L   CO2 23 22 - 32 mmol/L   Glucose, Bld 120 (H) 70 - 99 mg/dL   BUN 10 6 - 20 mg/dL   Creatinine, Ser 0.86 0.61 - 1.24 mg/dL   Calcium 8.7 (L) 8.9 - 10.3 mg/dL   Total Protein 6.7  6.5 - 8.1 g/dL   Albumin 4.2 3.5 - 5.0 g/dL   AST 48 (H) 15 - 41 U/L   ALT 35 0 - 44 U/L   Alkaline Phosphatase 91 38 - 126 U/L   Total Bilirubin 0.6 0.3 - 1.2 mg/dL   GFR calc non Af Amer >60 >60 mL/min   GFR calc Af Amer >60 >60 mL/min   Anion gap 15 5 - 15    Comment: Performed at [facility], [address] [place], [place] [address]  Ethanol     Status: Abnormal   Collection Time: 01/21/18 12:32 AM  Result Value Ref Range   Alcohol, Ethyl (B) 280 (H) <10 mg/dL    Comment: (NOTE) Lowest detectable limit for serum alcohol is 10 mg/dL. For medical purposes only. Performed at [facility], [address] [place], [place] [address]   CBC with Diff     Status: None   Collection Time: 01/21/18 12:32 AM  Result Value Ref Range   WBC 6.4 4.0 - 10.5 K/uL   RBC 4.88 4.22 - 5.81 MIL/uL   Hemoglobin 13.2 13.0 - 17.0 g/dL   HCT  41.2 39.0 - 52.0 %   MCV 84.4 80.0 - 100.0 fL   MCH 27.0 26.0 - 34.0 pg   MCHC 32.0 30.0 - 36.0 g/dL   RDW 16.115.0 09.611.5 - 04.515.5 %   Platelets 205 150 - 400 K/uL   nRBC 0.0 0.0 - 0.2 %   Neutrophils Relative % 46 %   Neutro Abs 3.0 1.7 - 7.7 K/uL   Lymphocytes Relative 40 %   Lymphs Abs 2.6 0.7 - 4.0 K/uL   Monocytes Relative 12 %   Monocytes Absolute 0.8 0.1 - 1.0 K/uL   Eosinophils Relative 1 %   Eosinophils Absolute 0.1 0.0 - 0.5 K/uL   Basophils Relative 1 %   Basophils Absolute 0.1 0.0 - 0.1 K/uL   Immature Granulocytes 0 %   Abs Immature Granulocytes 0.01 0.00 - 0.07 K/uL    Comment: Performed at [facility], [address] [place], [place] [address]  Urine rapid drug screen (hosp performed)     Status: None   Collection Time: 01/21/18  1:23 AM  Result Value Ref Range   Opiates NONE DETECTED NONE DETECTED   Cocaine NONE DETECTED NONE DETECTED   Benzodiazepines NONE DETECTED NONE DETECTED   Amphetamines NONE DETECTED NONE DETECTED   Tetrahydrocannabinol NONE DETECTED NONE DETECTED    Barbiturates NONE DETECTED NONE DETECTED    Comment: (NOTE) DRUG SCREEN FOR MEDICAL PURPOSES ONLY.  IF CONFIRMATION IS NEEDED FOR ANY PURPOSE, NOTIFY LAB WITHIN 5 DAYS. LOWEST DETECTABLE LIMITS FOR URINE DRUG SCREEN Drug Class                     Cutoff (ng/mL) Amphetamine and metabolites    1000 Barbiturate and metabolites    200 Benzodiazepine                 200 Tricyclics and metabolites     300 Opiates and metabolites        300 Cocaine and metabolites        300 THC                            50 Performed at [facility], [address] [place], [place] [address]   Ethanol     Status: Abnormal   Collection Time: 01/21/18  5:18 AM  Result Value Ref Range   Alcohol, Ethyl (B) 172 (H) <10 mg/dL    Comment: (NOTE) Lowest detectable limit for serum alcohol is 10 mg/dL. For medical purposes only. Performed at [facility], [address] [place], [place] [address]    No results found. None  Pending Labs Unresulted Labs (From admission, onward)   None      Vitals/Pain Today's Vitals   01/21/18 0132 01/21/18 0135 01/21/18 0630 01/21/18 0638  BP: 127/77 127/77 136/83 136/83  Pulse: 72 72 73 73  Resp: 18  18   Temp: 97.9 F (36.6 C)  98 F (36.7 C)   TempSrc: Oral  Oral   SpO2: 94%  96%   PainSc: 4   4      Isolation Precautions No active isolations  Medications Medications  LORazepam (ATIVAN) injection 0-4 mg ( Intravenous See Alternative 01/21/18 0656)    Or  LORazepam (ATIVAN) tablet 0-4 mg (1 mg Oral Given 01/21/18 0656)  LORazepam (ATIVAN) injection 0-4 mg (has no administration in time range)    Or  LORazepam (ATIVAN) tablet 0-4 mg (has  no administration in time range)  thiamine (VITAMIN B-1) tablet 100 mg (100 mg Oral Given 01/21/18 0310)    Or  thiamine (B-1) injection 100 mg ( Intravenous See Alternative 01/21/18 0310)    Mobility walks
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
## 2018-01-21 NOTE — Tx Team (Signed)
Interdisciplinary Treatment and Diagnostic Plan Update  01/21/2018 Time of Session:  Derek [name] MRN: [id number]  Principal Diagnosis: <principal problem not specified>  Secondary Diagnoses: Active Problems:   Severe recurrent major depression w/psychotic features, mood-congruent (HCC)   Current Medications:  Current Facility-Administered Medications  Medication Dose Route Frequency Provider Last Rate Last Dose  . acetaminophen (TYLENOL) tablet 650 mg  650 mg Oral Q6H PRN Rozetta Nunnery, NP      . alum & mag hydroxide-simeth (MAALOX/MYLANTA) 200-200-20 MG/5ML suspension 30 mL  30 mL Oral Q4H PRN Lindon Romp A, NP      . hydrOXYzine (ATARAX/VISTARIL) tablet 25 mg  25 mg Oral Q6H PRN Lindon Romp A, NP      . loperamide (IMODIUM) capsule 2-4 mg  2-4 mg Oral PRN Lindon Romp A, NP      . LORazepam (ATIVAN) tablet 1 mg  1 mg Oral Q6H PRN Lindon Romp A, NP      . LORazepam (ATIVAN) tablet 1 mg  1 mg Oral QID Rozetta Nunnery, NP       Followed by  . [START ON 01/22/2018] LORazepam (ATIVAN) tablet 1 mg  1 mg Oral TID Rozetta Nunnery, NP       Followed by  . [START ON 01/23/2018] LORazepam (ATIVAN) tablet 1 mg  1 mg Oral BID Rozetta Nunnery, NP       Followed by  . [START ON 01/24/2018] LORazepam (ATIVAN) tablet 1 mg  1 mg Oral Daily Lindon Romp A, NP      . magnesium hydroxide (MILK OF MAGNESIA) suspension 30 mL  30 mL Oral Daily PRN Lindon Romp A, NP      . multivitamin with minerals tablet 1 tablet  1 tablet Oral Daily Lindon Romp A, NP      . ondansetron (ZOFRAN-ODT) disintegrating tablet 4 mg  4 mg Oral Q6H PRN Rozetta Nunnery, NP      . Derrill Memo ON 01/22/2018] thiamine (VITAMIN B-1) tablet 100 mg  100 mg Oral Daily Lindon Romp A, NP       PTA Medications: Medications Prior to Admission  Medication Sig Dispense Refill Last Dose  . buPROPion (WELLBUTRIN XL) 150 MG 24 hr tablet Take 1 tablet (150 mg total) daily by mouth. (Patient not taking: Reported on 01/07/2018) 30 tablet 0 Not  Taking at Unknown time  . elvitegravir-cobicistat-emtricitabine-tenofovir (GENVOYA) 150-150-200-10 MG TABS tablet Take 1 tablet by mouth daily with breakfast. (Patient not taking: Reported on 11/13/2017) 5 tablet 0 Not Taking at Unknown time  . FLUoxetine (PROZAC) 40 MG capsule Take 1 capsule (40 mg total) daily by mouth. (Patient not taking: Reported on 01/07/2018) 30 capsule 0 Not Taking at Unknown time  . gabapentin (NEURONTIN) 100 MG capsule Take 1 capsule (100 mg total) 3 (three) times daily by mouth. (Patient not taking: Reported on 01/07/2018) 90 capsule 0 Not Taking at Unknown time  . ibuprofen (ADVIL,MOTRIN) 200 MG tablet Take 400 mg by mouth every 6 (six) hours as needed for moderate pain.   Past Month at Unknown time  . Multiple Vitamin (MULTIVITAMIN WITH MINERALS) TABS tablet Take 1 tablet by mouth daily.   Past Week at Unknown time    Patient Stressors:    Patient Strengths:    Treatment Modalities: Medication Management, Group therapy, Case management,  1 to 1 session with clinician, Psychoeducation, Recreational therapy.   Physician Treatment Plan for Primary Diagnosis: <principal problem not specified> Long Term Goal(s):     Short  Term Goals:    Medication Management: Evaluate patient's response, side effects, and tolerance of medication regimen.  Therapeutic Interventions: 1 to 1 sessions, Unit Group sessions and Medication administration.  Evaluation of Outcomes: Not Met  Physician Treatment Plan for Secondary Diagnosis: Active Problems:   Severe recurrent major depression w/psychotic features, mood-congruent ([place])  Long Term Goal(s):     Short Term Goals:       Medication Management: Evaluate patient's response, side effects, and tolerance of medication regimen.  Therapeutic Interventions: 1 to 1 sessions, Unit Group sessions and Medication administration.  Evaluation of Outcomes: Not Met   RN Treatment Plan for Primary Diagnosis: <principal problem not  specified> Long Term Goal(s): Knowledge of disease and therapeutic regimen to maintain health will improve  Short Term Goals: Ability to participate in decision making will improve, Ability to verbalize feelings will improve, Ability to disclose and discuss suicidal ideas, Ability to identify and develop effective coping behaviors will improve and Compliance with prescribed medications will improve  Medication Management: RN will administer medications as ordered by provider, will assess and evaluate patient's response and provide education to patient for prescribed medication. RN will report any adverse and/or side effects to prescribing provider.  Therapeutic Interventions: 1 on 1 counseling sessions, Psychoeducation, Medication administration, Evaluate responses to treatment, Monitor vital signs and CBGs as ordered, Perform/monitor CIWA, COWS, AIMS and Fall Risk screenings as ordered, Perform wound care treatments as ordered.  Evaluation of Outcomes: Not Met   LCSW Treatment Plan for Primary Diagnosis: <principal problem not specified> Long Term Goal(s): Safe transition to appropriate next level of care at discharge, Engage patient in therapeutic group addressing interpersonal concerns.  Short Term Goals: Engage patient in aftercare planning with referrals and resources  Therapeutic Interventions: Assess for all discharge needs, 1 to 1 time with Social worker, Explore available resources and support systems, Assess for adequacy in community support network, Educate family and significant other(s) on suicide prevention, Complete Psychosocial Assessment, Interpersonal group therapy.  Evaluation of Outcomes: Not Met   Progress in Treatment: Attending groups: No. Participating in groups: No. Taking medication as prescribed: Yes. Toleration medication: Yes. Family/Significant other contact made: No, will contact:  if patient consents to collateral contacts Patient understands diagnosis:  Yes. Discussing patient identified problems/goals with staff: Yes. Medical problems stabilized or resolved: Yes. Denies suicidal/homicidal ideation: No. Issues/concerns per patient self-inventory: No. Other:   New problem(s) identified: None   New Short Term/Long Term Goal(s):, medication stabilization, elimination of SI thoughts, development of comprehensive mental wellness plan.   Patient Goals:    Discharge Plan or Barriers: CSW will continue to follow and assess for appropriate referrals and possible discharge planning.   Reason for Continuation of Hospitalization: Anxiety Depression Medication stabilization Suicidal ideation  Estimated Length of Stay: 3-5 days   Attendees: Patient: 01/21/2018 10:19 AM  Physician: Dr. Myles Lipps, MD 01/21/2018 10:19 AM  Nursing: Estill Bamberg.Loletha Grayer, RN 01/21/2018 10:19 AM  RN Care Manager: Lars Pinks, RN 01/21/2018 10:19 AM  Social Worker: Theresa Duty 01/21/2018 10:19 AM  Recreational Therapist: Rhunette Croft 01/21/2018 10:19 AM  Other: Agustina Caroli, NP 01/21/2018 10:19 AM  Other:  01/21/2018 10:19 AM  Other: 01/21/2018 10:19 AM    Scribe for Treatment Team: Marylee Floras, [place] 01/21/2018 10:19 AM

## 2018-01-21 NOTE — Tx Team (Signed)
Initial Treatment Plan 01/21/2018 11:48 AM Derek [name] ZOX:[id number]    PATIENT STRESSORS: Financial difficulties Marital or family conflict Substance abuse   PATIENT STRENGTHS: Ability for insight Average or above average intelligence Motivation for treatment/growth Supportive family/friends   PATIENT IDENTIFIED PROBLEMS: "Getting drinking under control"  "I have no hope"  Depression  Anxiety               DISCHARGE CRITERIA:  Ability to meet basic life and health needs Adequate post-discharge living arrangements Improved stabilization in mood, thinking, and/or behavior Withdrawal symptoms are absent or subacute and managed without 24-hour nursing intervention  PRELIMINARY DISCHARGE PLAN: Outpatient therapy Placement in alternative living arrangements  PATIENT/FAMILY INVOLVEMENT: This treatment plan has been presented to and reviewed with the patient, Derek ConchJames Davidow.  The patient and family have been given the opportunity to ask questions and make suggestions.  Dewayne ShorterAlyssa  Enola Siebers, RN 01/21/2018, 11:48 AM

## 2018-01-21 NOTE — [facility] Suicide Risk Assessment (Signed)
[facility]   Nursing information obtained from:    Demographic factors:    Current Mental Status:    Loss Factors:    Historical Factors:    Risk Reduction Factors:     Total Time spent with patient: 30 minutes Principal Problem: <principal problem not specified> Diagnosis:  Active Problems:   Severe recurrent major depression w/psychotic features, mood-congruent (HCC)  Subjective Data: Patient is seen and examined.  Patient is a 57 year old male with a longstanding past psychiatric history significant for alcohol dependence who presented to the behavioral health hospital as a walk-in on 01/20/2018.  The patient was brought in by 2 colleagues from [facility].  He was shaking significantly, and they were frightened that he might be going into alcohol withdrawal.  The patient stated that he had been drinking on and off for the last several months.  He was discharged from our facility in November of this year.  [place], but was kicked out when he was caught drinking.  Since then he has been sleeping outside, or sleeping in unlocked cars.  He reported having the same close on for 6 days.  He was sent to the emergency room for evaluation.  Laboratories were obtained.  His blood alcohol was 172.  His drug screen was negative.  It was arranged for him to be transported back to the behavioral health hospital for evaluation and stabilization.  The patient stated that he was having suicidal ideation because of his lack of ability to remain sober.  During evaluation today he denied active suicidal ideation.  Review of the electronic medical record revealed multiple emergency room visits as well as admissions for alcohol related issues.  His last admission to our facility was on November/15/2018.  Continued Clinical Symptoms:    The "Alcohol Use Disorders Identification Test", Guidelines for Use in Primary Care, Second Edition.   World [employer] [facility](WHO). Score between 0-7:  no or low risk or alcohol related problems. Score between 8-15:  moderate risk of alcohol related problems. Score between 16-19:  high risk of alcohol related problems. Score 20 or above:  warrants further diagnostic evaluation for alcohol dependence and treatment.   CLINICAL FACTORS:   Depression:   Anhedonia Comorbid alcohol abuse/dependence Hopelessness Impulsivity Insomnia Alcohol/Substance Abuse/Dependencies   Musculoskeletal: Strength & Muscle Tone: within normal limits Gait & Station: normal Patient leans: N/A  Psychiatric Specialty Exam: Physical Exam  Nursing note and vitals reviewed. Constitutional: He is oriented to person, place, and time. He appears well-developed and well-nourished.  HENT:  Head: Normocephalic and atraumatic.  Respiratory: Effort normal.  Neurological: He is alert and oriented to person, place, and time.    ROS  There were no vitals taken for this visit.There is no height or weight on file to calculate BMI.  General Appearance: Disheveled  Eye Contact:  Fair  Speech:  Normal Rate  Volume:  Normal  Mood:  Anxious and Depressed  Affect:  Congruent  Thought Process:  Coherent and Descriptions of Associations: Intact  Orientation:  Full (Time, Place, and Person)  Thought Content:  Logical  Suicidal Thoughts:  No  Homicidal Thoughts:  No  Memory:  Immediate;   Good Recent;   Good Remote;   Good  Judgement:  Intact  Insight:  Fair  Psychomotor Activity:  Increased  Concentration:  Concentration: Fair and Attention Span: Fair  Recall:  [employer]  Fund of Knowledge:  Good  Language:  Good  Akathisia:  Negative  Handed:  Right  AIMS (if indicated):     Assets:  Desire for Improvement Physical Health Resilience  ADL's:  Intact  Cognition:  WNL  Sleep:         COGNITIVE FEATURES THAT CONTRIBUTE TO RISK:  None    SUICIDE RISK:   Mild:  Suicidal ideation of limited frequency,  intensity, duration, and specificity.  There are no identifiable plans, no associated intent, mild dysphoria and related symptoms, good self-control (both objective and subjective assessment), few other risk factors, and identifiable protective factors, including available and accessible social support.  PLAN OF CARE: Patient is seen and examined.  Patient is a 57 year old male with a past psychiatric history significant for alcohol dependence.  This would also include active alcohol withdrawal.  He will be admitted to the hospital.  He will be integrated into the milieu.  He will be placed on lorazepam 1 mg p.o. every 6 hours CIWA greater than 10.  I am going to give him 1 mg of lorazepam right now because of his tremor.  He had been on Wellbutrin in the past, and I consider restarting it from his last hospitalization, but because of the risk for seizures I am going to stop that.  I am going to restart his fluoxetine, and restart acamprosate 666 mg p.o. twice daily to start, and titrate that.  He will also be given gabapentin 100 mg p.o. 3 times daily.  He will also be given thiamine as well as folate for nutritional supplementation.  He will have trazodone available for him for sleep.  He will be placed on withdrawal precautions.  I certify that inpatient services furnished can reasonably be expected to improve the patient's condition.   Antonieta Pert, MD 01/21/2018, 12:27 PM
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
## 2018-01-21 NOTE — Progress Notes (Signed)
Patient did attend the evening speaker NA meeting.  

## 2018-01-21 NOTE — ED Notes (Signed)
[facility] called for transport. 

## 2018-01-21 NOTE — Therapy (Signed)
Occupational Therapy Group Note  Date:  01/21/2018 Time:  4:08 PM  Group Topic/Focus:  Self Esteem Action Plan:   The focus of this group is to help patients create a plan to continue to build self-esteem after discharge.  Participation Level:  Active  Participation Quality:  Appropriate  Affect:  Flat and Tearful  Cognitive:  Appropriate  Insight: Improving  Engagement in Group:  Engaged  Modes of Intervention:  Activity, Discussion, Education and Socialization  Additional Comments:    S: "I just about saw death, so I am really happy I am here"  O:Education given on self esteem and its relation to mental health. Pt to brainstorm negative vs positive self esteem factors. "Wear your mask" activity completed to have pts to draw what their "mask" looks like vs when it is taken off. Reflection and discussion to be done at completion of activities.  A: Pt presents to group with flat affect, intermittently tearful when talking about more difficult topics. Pt Contributed to self esteem discussion, stating failures and a status change have affected his self esteem. Pt completed mask activity, stating that he he wears a mask around his friends and family because he does not like when they ask what is wrong with him. Pt became tearful when discussing what brought him to the hospital.  P: Handouts given at end of session to facilitate carryover into community. OT groups to continue while pt inpatient.  Dalphine HandingKaylee Merlin Ege, MSOT, OTR/L Behavioral Health OT/ Acute Relief OT PHP Office: [phone]    Dalphine HandingKaylee Icey Tello 01/21/2018, 4:08 PM
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
## 2018-01-21 NOTE — ED Notes (Signed)
Second call placed on extend hold again , [facility] hold pt until at least 0800.

## 2018-01-21 NOTE — ED Notes (Signed)
Attempted to call report placed on extended hold waiting for RN

## 2018-01-21 NOTE — [facility] Group Notes (Signed)
[facility] Group Notes:  (Nursing/MHT/Case Management/Adjunct)  Date:  01/21/2018  Time:  4:00 PM Type of Therapy:  Nurse Education  Participation Level:  Active  Participation Quality:  Appropriate, Sharing and Supportive  Affect:  Appropriate  Cognitive:  Alert and Appropriate  Insight:  Appropriate and Improving  Engagement in Group:  Developing/Improving, Engaged and Supportive  Modes of Intervention:  Discussion, Socialization and Support  Summary of Progress/Problems: Patient participated appropriately in the discussion of personal development. Insight is improving.  Anisia Leija 01/21/2018, 6:00 PM

## 2018-01-22 DIAGNOSIS — F419 Anxiety disorder, unspecified: Secondary | ICD-10-CM

## 2018-01-22 DIAGNOSIS — F1020 Alcohol dependence, uncomplicated: Principal | ICD-10-CM

## 2018-01-22 DIAGNOSIS — G4700 Insomnia, unspecified: Secondary | ICD-10-CM

## 2018-01-22 DIAGNOSIS — F333 Major depressive disorder, recurrent, severe with psychotic symptoms: Secondary | ICD-10-CM

## 2018-01-22 NOTE — Progress Notes (Signed)
[facility] MD Progress Note  01/22/2018 1:25 PM Derek [name]  MRN:  [id number]  Subjective: Derek [name] reports , "I have been better, but fortunate to be here. I'm here because of alcoholism. I have lost everything that matters to me, my home, family & etc. What happened was, my friends over heard me say, "I don't care in I did not wake-up tomorrow. I feeling the fear associated with my alcoholism. My depression & anxiety are really high today. The Trazodone made me feel very drowsy this morning. I do not want this medicine again".  Patient is a 57 year old male with a longstanding past psychiatric history significant for alcohol dependence who presented to the behavioral health hospital as a walk-in on 01/20/2018. The patient was brought in by 2 colleagues from [facility]. He was shaking significantly, and they were frightened that he might be going into alcohol withdrawal. The patient stated that he had been drinking on and off for the last several months. He was discharged from our facility in November of this year. He had been staying at an [place] house recently, but was kicked out when he was caught drinking. Since then he has been sleeping outside, or sleeping in unlocked cars. He reported having the same close on for 6 days. He was sent to the emergency room for evaluation. Laboratories were obtained. His blood alcohol was 172. His drug screen was negative. It was arranged for him to be transported back to the behavioral health hospital for evaluation and stabilization. The patient stated that he was having suicidal ideation because of his lack of ability to remain sober. During evaluation today he denied active suicidal ideation. Review of the electronic medical record revealed multiple emergency room visits as well as admissions for alcohol related issues. His last admission to our facility was on November/15/2018.  Objective: Today, 01-22-18, Derek [name] is seen, chart reviewed. The chart  findings discussed with the treatment team. He present today highly anxious. He is ruminationing on how he lost everything that he has ever worked for. He says his anxiety & depression were so high today, rates both 3-10. He says he has never been treated for depression.  Principal Problem: <principal problem not specified>  Diagnosis:   Patient Active Problem List   Diagnosis Date Noted  . Severe recurrent major depression without psychotic features (HCC) [F33.2] 12/20/2016    Priority: High  . Alcohol use disorder, severe, dependence (HCC) [F10.20] 12/19/2016    Priority: Medium  . Severe recurrent major depression w/psychotic features, mood-congruent (HCC) [F33.3] 01/21/2018  . Major depressive disorder, recurrent episode (HCC) [F33.9] 12/19/2016  . Essential hypertension [I10] 11/08/2015  . Obesity (BMI 30.0-34.9) [E66.9] 11/08/2015  . Sinus bradycardia [R00.1] 11/08/2015   Total Time spent with patient: 15 minutes  Past Psychiatric History: See H&P  Past Medical History:  Past Medical History:  Diagnosis Date  . Alcohol abuse   . Anxiety   . Depression   . Essential hypertension 11/08/2015  . Homelessness   . Hypertension   . Obesity (BMI 30.0-34.9) 11/08/2015  . Sinus bradycardia 11/08/2015    Past Surgical History:  Procedure Laterality Date  . CHOLECYSTECTOMY    . GASTRIC BYPASS     2006 - done at [facility]  . ROTATOR CUFF REPAIR  2017  . ROUX-EN-Y PROCEDURE  2007  . SHOULDER ARTHROSCOPY WITH SUBACROMIAL DECOMPRESSION Right 04/17/2015   Procedure: SHOULDER ARTHROSCOPY ROTATOR CUFF DEBRIDEMENT WITH SUBACROMIAL DECOMPRESSION;  Surgeon: Jones Broom, MD;  Location: [facility];  Service: Orthopedics;  Laterality: Right;  . TONSILLECTOMY    . wisdom teeth extractions     Family History:  Family History  Problem Relation Age of Onset  . Cancer Mother   . Peripheral Artery Disease Father   . Heart attack Maternal Grandfather    Family Psychiatric   History: See H&P.  Social History:  Social History   Substance and Sexual Activity  Alcohol Use Yes   Comment: one fifth per day     Social History   Substance and Sexual Activity  Drug Use Not Currently    Social History   Socioeconomic History  . Marital status: Married    Spouse name: Not on file  . Number of children: Not on file  . Years of education: Not on file  . Highest education level: Not on file  Occupational History  . Not on file  Social Needs  . Financial resource strain: Not on file  . Food insecurity:    Worry: Not on file    Inability: Not on file  . Transportation needs:    Medical: Not on file    Non-medical: Not on file  Tobacco Use  . Smoking status: Never Smoker  . Smokeless tobacco: Never Used  Substance and Sexual Activity  . Alcohol use: Yes    Comment: one fifth per day  . Drug use: Not Currently  . Sexual activity: Not Currently  Lifestyle  . Physical activity:    Days per week: Not on file    Minutes per session: Not on file  . Stress: Not on file  Relationships  . Social connections:    Talks on phone: Not on file    Gets together: Not on file    Attends religious service: Not on file    Active member of club or organization: Not on file    Attends meetings of clubs or organizations: Not on file    Relationship status: Not on file  Other Topics Concern  . Not on file  Social History Narrative  . Not on file   Additional Social History:   Sleep: Good  Appetite:  Good  Current Medications: Current Facility-Administered Medications  Medication Dose Route Frequency Provider Last Rate Last Dose  . acamprosate (CAMPRAL) tablet 666 mg  666 mg Oral BID WC Antonieta Pert, MD   666 mg at 01/22/18 0737  . alum & mag hydroxide-simeth (MAALOX/MYLANTA) 200-200-20 MG/5ML suspension 30 mL  30 mL Oral Q4H PRN Nira Conn A, NP      . FLUoxetine (PROZAC) capsule 20 mg  20 mg Oral Daily Antonieta Pert, MD   20 mg at 01/22/18  0738  . folic acid (FOLVITE) tablet 1 mg  1 mg Oral Daily Antonieta Pert, MD   1 mg at 01/22/18 0739  . gabapentin (NEURONTIN) capsule 100 mg  100 mg Oral TID Antonieta Pert, MD   100 mg at 01/22/18 1136  . hydrOXYzine (ATARAX/VISTARIL) tablet 25 mg  25 mg Oral Q6H PRN Nira Conn A, NP   25 mg at 01/21/18 2203  . loperamide (IMODIUM) capsule 2-4 mg  2-4 mg Oral PRN Nira Conn A, NP      . LORazepam (ATIVAN) tablet 1 mg  1 mg Oral Q6H PRN Nira Conn A, NP   1 mg at 01/21/18 1657  . magnesium hydroxide (MILK OF MAGNESIA) suspension 30 mL  30 mL Oral Daily PRN Jackelyn Poling, NP      .  multivitamin with minerals tablet 1 tablet  1 tablet Oral Daily Nira ConnBerry, Jason A, NP   1 tablet at 01/22/18 0739  . ondansetron (ZOFRAN-ODT) disintegrating tablet 4 mg  4 mg Oral Q6H PRN Nira ConnBerry, Jason A, NP      . thiamine (VITAMIN B-1) tablet 100 mg  100 mg Oral Daily Nira ConnBerry, Jason A, NP   100 mg at 01/22/18 0737  . traZODone (DESYREL) tablet 50 mg  50 mg Oral QHS PRN Antonieta Pertlary, Greg Lawson, MD   50 mg at 01/21/18 2203   Lab Results:  Results for orders placed or performed during the hospital encounter of 01/20/18 (from the past 48 hour(s))  Comprehensive metabolic panel     Status: Abnormal   Collection Time: 01/21/18 12:32 AM  Result Value Ref Range   Sodium 141 135 - 145 mmol/L   Potassium 3.6 3.5 - 5.1 mmol/L   Chloride 103 98 - 111 mmol/L   CO2 23 22 - 32 mmol/L   Glucose, Bld 120 (H) 70 - 99 mg/dL   BUN 10 6 - 20 mg/dL   Creatinine, Ser 9.601.03 0.61 - 1.24 mg/dL   Calcium 8.7 (L) 8.9 - 10.3 mg/dL   Total Protein 6.7 6.5 - 8.1 g/dL   Albumin 4.2 3.5 - 5.0 g/dL   AST 48 (H) 15 - 41 U/L   ALT 35 0 - 44 U/L   Alkaline Phosphatase 91 38 - 126 U/L   Total Bilirubin 0.6 0.3 - 1.2 mg/dL   GFR calc non Af Amer >60 >60 mL/min   GFR calc Af Amer >60 >60 mL/min   Anion gap 15 5 - 15    Comment: Performed at [facility], [address] [place], [place] [address]  Ethanol     Status:  Abnormal   Collection Time: 01/21/18 12:32 AM  Result Value Ref Range   Alcohol, Ethyl (B) 280 (H) <10 mg/dL    Comment: (NOTE) Lowest detectable limit for serum alcohol is 10 mg/dL. For medical purposes only. Performed at [facility], [address] [place], [place] [address]   CBC with Diff     Status: None   Collection Time: 01/21/18 12:32 AM  Result Value Ref Range   WBC 6.4 4.0 - 10.5 K/uL   RBC 4.88 4.22 - 5.81 MIL/uL   Hemoglobin 13.2 13.0 - 17.0 g/dL   HCT 47.841.2 29.539.0 - 62.152.0 %   MCV 84.4 80.0 - 100.0 fL   MCH 27.0 26.0 - 34.0 pg   MCHC 32.0 30.0 - 36.0 g/dL   RDW 30.815.0 65.711.5 - 84.615.5 %   Platelets 205 150 - 400 K/uL   nRBC 0.0 0.0 - 0.2 %   Neutrophils Relative % 46 %   Neutro Abs 3.0 1.7 - 7.7 K/uL   Lymphocytes Relative 40 %   Lymphs Abs 2.6 0.7 - 4.0 K/uL   Monocytes Relative 12 %   Monocytes Absolute 0.8 0.1 - 1.0 K/uL   Eosinophils Relative 1 %   Eosinophils Absolute 0.1 0.0 - 0.5 K/uL   Basophils Relative 1 %   Basophils Absolute 0.1 0.0 - 0.1 K/uL   Immature Granulocytes 0 %   Abs Immature Granulocytes 0.01 0.00 - 0.07 K/uL    Comment: Performed at [facility], [address] [place], [place] [address]  Urine rapid drug screen (hosp performed)     Status: None   Collection Time: 01/21/18  1:23 AM  Result Value Ref Range  Opiates NONE DETECTED NONE DETECTED   Cocaine NONE DETECTED NONE DETECTED   Benzodiazepines NONE DETECTED NONE DETECTED   Amphetamines NONE DETECTED NONE DETECTED   Tetrahydrocannabinol NONE DETECTED NONE DETECTED   Barbiturates NONE DETECTED NONE DETECTED    Comment: (NOTE) DRUG SCREEN FOR MEDICAL PURPOSES ONLY.  IF CONFIRMATION IS NEEDED FOR ANY PURPOSE, NOTIFY LAB WITHIN 5 DAYS. LOWEST DETECTABLE LIMITS FOR URINE DRUG SCREEN Drug Class                     Cutoff (ng/mL) Amphetamine and metabolites    1000 Barbiturate and metabolites    200 Benzodiazepine                 200 Tricyclics and  metabolites     300 Opiates and metabolites        300 Cocaine and metabolites        300 THC                            50 Performed at [facility], [address] [place], [place] [address]   Ethanol     Status: Abnormal   Collection Time: 01/21/18  5:18 AM  Result Value Ref Range   Alcohol, Ethyl (B) 172 (H) <10 mg/dL    Comment: (NOTE) Lowest detectable limit for serum alcohol is 10 mg/dL. For medical purposes only. Performed at [facility], [address] [place], [place] [address]    Blood Alcohol level:  Lab Results  Component Value Date   ETH 172 (H) 01/21/2018   ETH 280 (H) 01/21/2018   Metabolic Disorder Labs: No results found for: HGBA1C, MPG No results found for: PROLACTIN No results found for: CHOL, TRIG, HDL, CHOLHDL, VLDL, LDLCALC  Physical Findings: AIMS: Facial and Oral Movements Muscles of Facial Expression: None, normal Lips and Perioral Area: None, normal Jaw: None, normal Tongue: None, normal,Extremity Movements Upper (arms, wrists, hands, fingers): None, normal Lower (legs, knees, ankles, toes): None, normal, Trunk Movements Neck, shoulders, hips: None, normal, Overall Severity Severity of abnormal movements (highest score from questions above): None, normal Incapacitation due to abnormal movements: None, normal Patient's awareness of abnormal movements (rate only patient's report): No Awareness, Dental Status Current problems with teeth and/or dentures?: Yes Does patient usually wear dentures?: No  CIWA:  CIWA-Ar Total: 1 COWS:  COWS Total Score: 6  Musculoskeletal: Strength & Muscle Tone: within normal limits Gait & Station: normal Patient leans: N/A  Psychiatric Specialty Exam: Physical Exam  Nursing note and vitals reviewed. Constitutional: He is oriented to person, place, and time.  Cardiovascular: Normal rate.  Neurological: He is alert and oriented to person, place, and time.  Psychiatric:  He has a normal mood and affect. His behavior is normal.    Review of Systems  Constitutional: Negative.   HENT: Negative.   Eyes: Negative.   Respiratory: Negative.   Cardiovascular: Negative.   Gastrointestinal: Negative.  Negative for heartburn, nausea and vomiting.  Genitourinary: Negative.   Musculoskeletal: Negative.   Skin: Negative.   Neurological: Negative for dizziness and headaches.  Psychiatric/Behavioral: Positive for depression (Improving) and substance abuse (Hx. alcoholism, chronic). Negative for hallucinations, memory loss and suicidal ideas. The patient is nervous/anxious (ongoing) and has insomnia (Anxious).     Blood pressure (!) 135/92, pulse 80, temperature (!) 97.5 F (36.4 C), temperature source Oral, resp. rate 16, height 6\' 1"  (1.854 m), weight 104.3 kg,  SpO2 97 %.Body mass index is 30.34 kg/m.  General Appearance: Disheveled  Eye Contact:  Fair  Speech:  Normal Rate  Volume:  Normal  Mood:  Anxious and Depressed  Affect:  Congruent  Thought Process:  Coherent and Descriptions of Associations: Intact  Orientation:  Full (Time, Place, and Person)  Thought Content:  Logical  Suicidal Thoughts:  No  Homicidal Thoughts:  No  Memory:  Immediate;   Fair Recent;   Fair Remote;   Fair  Judgement:  Impaired  Insight:  Fair  Psychomotor Activity:  Increased  Concentration:  Concentration: Fair and Attention Span: Fair  Recall:  [employer] of Knowledge:  Fair  Language:  Fair  Akathisia:  Negative  Handed:  Right  AIMS (if indicated):     Assets:  Desire for Improvement Physical Health Resilience  ADL's:  Intact  Cognition:  WNL  Sleep:        Treatment Plan Summary: Daily contact with patient to assess and evaluate symptoms and progress in treatment and Medication management.      - Continue inpatient hospitalization.      - Will continue today 01/22/2018 plan as below except where it is noted.  Alcohol cravings.      - Continue Campral 666 mg  po tid.  Depression.      - Continue Fluoxetine 20 mg po daily.  Anxiety.      - Continue Hydroxyzine 25 mg po PRN Q 6 hours for CIWA , or > 10      - Continue Lorazepam  1 mg po Q 6 hours prn for CIWA > 10.  Insomnia.      - Disontinue Trazodone 50 mg po PRN, patient does not want to take.  CSW will start working on disposition.   Patient to participate in therapeutic milieu  Armandina Stammer, NP, PMHNP, FNP-BC 01/22/2018, 1:25 PM  Patient ID: Derek [name], male   DOB: 17-Dec-1960, 57 y.o.   MRN: [id number]

## 2018-01-22 NOTE — [facility] Group Notes (Signed)
The focus of this group is to help patients establish daily goals to achieve during treatment and discuss how the patient can incorporate goal setting into their daily lives to aide in recovery.  Pt. Did not attend 

## 2018-01-22 NOTE — [facility] Suicide Risk Assessment (Signed)
[facility] INPATIENT:  Family/Significant Other Suicide Prevention Education  Suicide Prevention Education:  Contact Attempts: Derek [name] (pt's wife) [phone] has been identified by the patient as the family member/significant other with whom the patient will be residing, and identified as the person(s) who will aid the patient in the event of a mental health crisis.  With written consent from the patient, two attempts were made to provide suicide prevention education, prior to and/or following the patient's discharge.  We were unsuccessful in providing suicide prevention education.  A suicide education pamphlet was given to the patient to share with family/significant other.  Date and time of first attempt: 01/22/18 at 2:13PM (HIPPA compliant voicemail left requesting call back at her earliest convenience).  Date and time of second attempt: TO BE DONE.   Rona RavensHeather S Rilya Longo LCSW 01/22/2018, 2:13 PM

## 2018-01-22 NOTE — Progress Notes (Signed)
The patient rated his day as a 6 out of a possible 10. He states that he didn't have a very good day since he is still detox ing. His goal for tomorrow is to continue to feel better.

## 2018-01-22 NOTE — Progress Notes (Signed)
Patient denies SI, HI and AVH.  Patient has been compliant with medications attended groups and engaged in 1:1 staff talks.   Assess patient for safety engage patient in 1:1 staff talks, offer medications as prescribed.  Continue to monitor for safety.  

## 2018-01-23 MED ORDER — ACAMPROSATE CALCIUM 333 MG PO TBEC
666.0000 mg | DELAYED_RELEASE_TABLET | Freq: Three times a day (TID) | ORAL | Status: DC
  Administered 2018-01-23 – 2018-01-26 (×8): 666 mg via ORAL
  Filled 2018-01-23 (×11): qty 2

## 2018-01-23 MED ORDER — FLUOXETINE HCL 20 MG PO CAPS
40.0000 mg | ORAL_CAPSULE | Freq: Every day | ORAL | Status: DC
  Administered 2018-01-24 – 2018-01-26 (×3): 40 mg via ORAL
  Filled 2018-01-23 (×4): qty 2

## 2018-01-23 NOTE — Progress Notes (Signed)
Recreation Therapy Notes  Date: 12.20.19 Time: 0930 Location: 300 Hall Dayroom  Group Topic: Stress Management  Goal Area(s) Addresses:  Patient will verbalize importance of using healthy stress management.  Patient will identify positive emotions associated with healthy stress management.   Behavioral Response: Engaged  Intervention: Stress Management  Activity :  Guided Imagery.  LRT introduced the stress management technique of guided imagery.  LRT read a script on sitting and observing the starry sky at night.  Patients were to follow along as script as read.  Education:  Stress Management, Discharge Planning.   Education Outcome: Acknowledges edcuation/In group clarification offered/Needs additional education  Clinical Observations/Feedback: Pt attended and participated in group.     Caroll RancherMarjette Maryella Abood, LRT/CTRS         Caroll RancherLindsay, Tiffini Blacksher A 01/23/2018 11:44 AM

## 2018-01-23 NOTE — Progress Notes (Addendum)
D Pt is observed OOB UAL on the 300 hall today he tolerates this well. He is seen sitting in the dayroom - he interacts appropriately with his peers and he talks on the phone and he says "I'm going home today!"- when writer introduces herself  to him this morning .      A HIs affect is flat and depressed, He maintains good eye contact. HE completed his daily assessment and on this he wrote  He denied SI today and he rated his depression, hopelessness and anxiety " 7/6/8", respectively. His affect is quiet and withdrawan.     R Safety is in place and poc cont.

## 2018-01-23 NOTE — Progress Notes (Signed)
Nursing Progress Note: 7p-7a D: Pt currently presents with a sad/depressed/tremulous/anxious affect and behavior. Pt states "I really fell like I'm heading towards better health." Interacting appropriately with the milieu. Pt reports good sleep during the previous night with current medication regimen. Pt did attend wrap-up group.  A: Pt provided with medications per providers orders. Pt's labs and vitals were monitored throughout the night. Pt supported emotionally and encouraged to express concerns and questions. Pt educated on medications.  R: Pt's safety ensured with 15 minute and environmental checks. Pt currently denies SI, HI, and AVH. Pt verbally contracts to seek staff if SI,HI, or AVH occurs and to consult with staff before acting on any harmful thoughts. Will continue to monitor.

## 2018-01-23 NOTE — Progress Notes (Signed)
[facility]  01/23/2018 2:07 PM Derek ConchJames [name]  MRN:  [id number] Subjective:  "I'm not as good as I was."  From MD's admission H&P: Patient is a 57 year old male with a longstanding past psychiatric history significant for alcohol dependence who presented to the behavioral health hospital as a walk-in on 01/20/2018. The patient was brought in by 2 colleagues from [facility]. He was shaking significantly, and they were frightened that he might be going into alcohol withdrawal. The patient stated that he had been drinking on and off for the last several months. He was discharged from our facility in November of this year. He had been staying at an [place] recently, but was kicked out when he was caught drinking. Since then he has been sleeping outside, or sleeping in unlocked cars. He reported having the same close on for 6 days. He was sent to the emergency room for evaluation. Laboratories were obtained. His blood alcohol was 172. His drug screen was negative. It was arranged for him to be transported back to the behavioral health hospital for evaluation and stabilization. The patient stated that he was having suicidal ideation because of his lack of ability to remain sober. During evaluation today he denied active suicidal ideation. Review of the electronic medical record revealed multiple emergency room visits as well as admissions for alcohol related issues. His last admission to our facility was on November/15/2018.  Today [name] is found socializing with other patients in dayroom. He becomes tearful during the assessment, reporting worsening depression and anxiety today, rated 10/10. He states that he feels depressed due to his alcoholism and feels hopeless about treatment options after discharge- "I've already tried all the rehab programs." He reports waking up this morning feeling anxious about his future, and then talking about addiction during group therapy  this morning made him feel overwhelmed. He does report attending AA and having a sponsor was helpful for him during a 2-year period of sobriety in the past- says he relapsed when he stopped taking AA and his treatment seriously. States that he can return to AA after discharge. Denies SI/HI/AVH. Denies medication side effects. States he has taken Prozac, Campral in the past and receptive to dose increases. He has a mild tremor but denies any other withdrawal symptoms.   Principal Problem: Alcohol use disorder, severe, dependence (HCC) Diagnosis: Principal Problem:   Alcohol use disorder, severe, dependence (HCC) Active Problems:   Severe recurrent major depression without psychotic features (HCC)   Severe recurrent major depression w/psychotic features, mood-congruent (HCC)  Total Time spent with patient: 20 minutes  Past Psychiatric History: See admission H&P  Past Medical History:  Past Medical History:  Diagnosis Date  . Alcohol abuse   . Anxiety   . Depression   . Essential hypertension 11/08/2015  . Homelessness   . Hypertension   . Obesity (BMI 30.0-34.9) 11/08/2015  . Sinus bradycardia 11/08/2015    Past Surgical History:  Procedure Laterality Date  . CHOLECYSTECTOMY    . [facility]     [facility]  . ROTATOR CUFF REPAIR  2017  . ROUX-EN-Y PROCEDURE  2007  . SHOULDER ARTHROSCOPY WITH SUBACROMIAL DECOMPRESSION Right 04/17/2015   Procedure: SHOULDER ARTHROSCOPY ROTATOR CUFF DEBRIDEMENT WITH SUBACROMIAL DECOMPRESSION;  Surgeon: Jones BroomJustin Chandler, MD;  Location: [facility];  Service: Orthopedics;  Laterality: Right;  . TONSILLECTOMY    . wisdom teeth extractions     Family History:  Family History  Problem Relation  Age of Onset  . Cancer Mother   . Peripheral Artery Disease Father   . Heart attack Maternal Grandfather    Family Psychiatric  History: See admission H&P Social History:  Social History   Substance and Sexual Activity  Alcohol Use  Yes   Comment: one fifth per day     Social History   Substance and Sexual Activity  Drug Use Not Currently    Social History   Socioeconomic History  . Marital status: Married    Spouse name: Not on file  . Number of children: Not on file  . Years of education: Not on file  . Highest education level: Not on file  Occupational History  . Not on file  Social Needs  . Financial resource strain: Not on file  . Food insecurity:    Worry: Not on file    Inability: Not on file  . Transportation needs:    Medical: Not on file    Non-medical: Not on file  Tobacco Use  . Smoking status: Never Smoker  . Smokeless tobacco: Never Used  Substance and Sexual Activity  . Alcohol use: Yes    Comment: one fifth per day  . Drug use: Not Currently  . Sexual activity: Not Currently  Lifestyle  . Physical activity:    Days per week: Not on file    Minutes per session: Not on file  . Stress: Not on file  Relationships  . Social connections:    Talks on phone: Not on file    Gets together: Not on file    Attends religious service: Not on file    Active member of club or organization: Not on file    Attends meetings of clubs or organizations: Not on file    Relationship status: Not on file  Other Topics Concern  . Not on file  Social History Narrative  . Not on file   Additional Social History:                         Sleep: Fair  Appetite:  Good  Current Medications: Current Facility-Administered Medications  Medication Dose Route Frequency Provider Last Rate Last Dose  . acamprosate (CAMPRAL) tablet 666 mg  666 mg Oral TID Aldean Baker, NP      . alum & mag hydroxide-simeth (MAALOX/MYLANTA) 200-200-20 MG/5ML suspension 30 mL  30 mL Oral Q4H PRN Jackelyn Poling, NP      . Melene Muller ON 01/24/2018] FLUoxetine (PROZAC) capsule 40 mg  40 mg Oral Daily Aldean Baker, NP      . folic acid (FOLVITE) tablet 1 mg  1 mg Oral Daily Antonieta Pert, MD   1 mg at 01/23/18  0815  . gabapentin (NEURONTIN) capsule 100 mg  100 mg Oral TID Antonieta Pert, MD   100 mg at 01/23/18 1209  . hydrOXYzine (ATARAX/VISTARIL) tablet 25 mg  25 mg Oral Q6H PRN Nira Conn A, NP   25 mg at 01/23/18 0815  . loperamide (IMODIUM) capsule 2-4 mg  2-4 mg Oral PRN Nira Conn A, NP      . LORazepam (ATIVAN) tablet 1 mg  1 mg Oral Q6H PRN Nira Conn A, NP   1 mg at 01/21/18 1657  . magnesium hydroxide (MILK OF MAGNESIA) suspension 30 mL  30 mL Oral Daily PRN Nira Conn A, NP      . multivitamin with minerals tablet 1 tablet  1 tablet  Oral Daily Nira Conn A, NP   1 tablet at 01/23/18 0815  . ondansetron (ZOFRAN-ODT) disintegrating tablet 4 mg  4 mg Oral Q6H PRN Nira Conn A, NP      . thiamine (VITAMIN B-1) tablet 100 mg  100 mg Oral Daily Nira Conn A, NP   100 mg at 01/23/18 0815  . traZODone (DESYREL) tablet 50 mg  50 mg Oral QHS PRN Antonieta Pert, MD   50 mg at 01/21/18 2203    Lab Results: No results found for this or any previous visit (from the past 48 hour(s)).  Blood Alcohol level:  Lab Results  Component Value Date   ETH 172 (H) 01/21/2018   ETH 280 (H) 01/21/2018    Metabolic Disorder Labs: No results found for: HGBA1C, MPG No results found for: PROLACTIN No results found for: CHOL, TRIG, HDL, CHOLHDL, VLDL, LDLCALC  Physical Findings: AIMS: Facial and Oral Movements Muscles of Facial Expression: None, normal Lips and Perioral Area: None, normal Jaw: None, normal Tongue: None, normal,Extremity Movements Upper (arms, wrists, hands, fingers): None, normal Lower (legs, knees, ankles, toes): None, normal, Trunk Movements Neck, shoulders, hips: None, normal, Overall Severity Severity of abnormal movements (highest score from questions above): None, normal Incapacitation due to abnormal movements: None, normal Patient's awareness of abnormal movements (rate only patient's report): No Awareness, Dental Status Current problems with teeth and/or  dentures?: Yes Does patient usually wear dentures?: No  CIWA:  CIWA-Ar Total: 1 COWS:  COWS Total Score: 6  Musculoskeletal: Strength & Muscle Tone: within normal limits Gait & Station: normal Patient leans: N/A  Psychiatric Specialty Exam: Physical Exam  Nursing note and vitals reviewed. Constitutional: He is oriented to person, place, and time. He appears well-developed.  HENT:  Head: Normocephalic.  Respiratory: Effort normal.  Musculoskeletal: Normal range of motion.  Neurological: He is alert and oriented to person, place, and time.    Review of Systems  Neurological: Positive for tremors (Mild tremor ETOH withdrawal).  Psychiatric/Behavioral: Positive for depression and substance abuse (Hx ETOH). Negative for hallucinations, memory loss and suicidal ideas. The patient is nervous/anxious. The patient does not have insomnia.   All other systems reviewed and are negative.   Blood pressure (!) 145/95, pulse 68, temperature 98 F (36.7 C), temperature source Oral, resp. rate 16, height 6\' 1"  (1.854 m), weight 104.3 kg, SpO2 97 %.Body mass index is 30.34 kg/m.  General Appearance: Casual  Eye Contact:  Good  Speech:  Clear and Coherent  Volume:  Normal  Mood:  Depressed  Affect:  Tearful  Thought Process:  Coherent  Orientation:  Full (Time, Place, and Person)  Thought Content:  WDL  Suicidal Thoughts:  No  Homicidal Thoughts:  No  Memory:  Immediate;   Good  Judgement:  Fair  Insight:  Fair  Psychomotor Activity:  Normal  Concentration:  Concentration: Good  Recall:  Good  Fund of Knowledge:  Good  Language:  Good  Akathisia:  No  Handed:  Right  AIMS (if indicated):     Assets:  Communication Skills Desire for Improvement Social Support Vocational/Educational  ADL's:  Intact  Cognition:  WNL  Sleep:  Number of Hours: 6.5     Treatment Plan Summary: Daily contact with patient to assess and evaluate symptoms and progress in treatment and Medication  management  Continue inpatient hospitalization  Alcohol cravings - Increase Campral 666 mg PO from BID to TID  Alcohol withdrawal - Continue Ativan 1 mg PO Q6HR PRN  CIWA > 10  Depression - Increase Prozac from 20 to 40 mg PO daily  Anxiety - Continue gabapentin 100 mg PO TID - Continue Vistaril 25 mg PO Q6HR PRN anxiety  Supplementation - Continue folic acid 1 mg PO daily - Continue thiamine 100 mg PO daily  Patient will participate in the therapeutic group milieu.  Discharge disposition in progress.    Aldean BakerJanet E Shakena Callari, NP 01/23/2018, 2:07 PM

## 2018-01-23 NOTE — Progress Notes (Signed)
D.  Pt pleasant on approach, some complaint of anxiety.  Pt was positive for evening AA group, observed engaged in appropriate interaction with peers on the unit.  Pt denies SI/HI/AVH at this time.  A.  Support and encouragement offered, medication given as ordered for anxiety.  R.  Pt remains safe on the unit, will continue to monitor.

## 2018-01-24 DIAGNOSIS — F332 Major depressive disorder, recurrent severe without psychotic features: Secondary | ICD-10-CM

## 2018-01-24 MED ORDER — HYDROXYZINE HCL 50 MG PO TABS
50.0000 mg | ORAL_TABLET | Freq: Four times a day (QID) | ORAL | Status: DC | PRN
  Administered 2018-01-24 – 2018-01-26 (×5): 50 mg via ORAL
  Filled 2018-01-24 (×5): qty 1

## 2018-01-24 NOTE — B[facility] Group Notes (Signed)
LCSW Group Therapy Note  01/24/2018    10:00-11:00am   Type of Therapy and Topic:  Group Therapy: Shame and Its Impact   Participation Level:  Active   Description of Group:   In this group, patients shared and discussed that guilt is the negative feeling we have when we've done something wrong, while shame is the negative feeling we have simply about "being."  In listening to each other share, patients learned that humans are all imperfect and that there is no shame in this.  We discussed how it could positively impact our wellbeing by accepting our faults as part of our being that can be worked on but does not have to shame us.    Therapeutic Goals: 1. Patients will learn the difference between guilt and shame. 2. Patients will share their current shame feelings and how this has impacted their current lives. 3. Patients will explore possible ways to think differently about those parts of their bodies, feelings, and lives about which they do have shame. 4. Patients will learn that shame is universal, and that keeping our shame a secret actually increases its hold on us.  Summary of Patient Progress:  The patient shared that he feels shame about his alcoholism and what it has done to destroy his life.  This is of concern because he used to have a successful career and did not start drinking until age 57yo.  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  He now realizes he never figured out why he ate like he did, and thus it was transferred into a new addiction.  Therapeutic Modalities:   Cognitive Behavioral Therapy Motivation Interviewing  Lynnell ChadMareida J Grossman-Orr  .

## 2018-01-24 NOTE — Progress Notes (Signed)
Patient attended the evening A.A. meeting and was appropriate.  

## 2018-01-24 NOTE — Progress Notes (Signed)
[facility]  01/24/2018 10:16 AM Derek [name]  MRN:  [id number]  Evaluation: Derek [name] is awake alert and oriented x3.  Seen attending group sessions with active and engaged participation.  Patient presents pleasant, calm and cooperative.  Reports feeling better physically however reports "emotional roller coaster."  Chart reviewed.  Recent medication adjustment to current antidepressant Prozac 20 mg to 40 mg p.o. daily.  During this assessment discussed increasing Vistaril 25 mg to 50 mg for anxiety reported symptoms.  Patient reports he is hopeful to attend a residential treatment program.  States he was never discharged from the [place] [facility] and was hopeful that he can call back after discharge.  Denies suicidal or homicidal ideations.  Denies auditory or visual hallucinations.  Reports a good appetite.  States he is resting well throughout the night.  Support encouragement and reassurance was provided.     From MD's admission H&P- Patient is a 57 year old male with a longstanding past psychiatric history significant for alcohol dependence who presented to the behavioral health hospital as a walk-in on 01/20/2018. The patient was brought in by 2 colleagues [facility]. He was shaking significantly, and they were frightened that he might be going into alcohol withdrawal. The patient stated that he had been drinking on and off for the last several months. He was discharged from our facility in November of this year. He had been staying at an [place] recently, but was kicked out when he was caught drinking. Since then he has been sleeping outside, or sleeping in unlocked cars. He reported having the same close on for 6 days. He was sent to the emergency room for evaluation. Laboratories were obtained. His blood alcohol was 172. His drug screen was negative. It was arranged for him to be transported back to the behavioral health hospital for evaluation and stabilization.  The patient stated that he was having suicidal ideation because of his lack of ability to remain sober. During evaluation today he denied active suicidal ideation. Review of the electronic medical record revealed multiple emergency room visits as well as admissions for alcohol related issues. His last admission to our facility was on November/15/2018.     Principal Problem: Alcohol use disorder, severe, dependence (HCC) Diagnosis: Principal Problem:   Alcohol use disorder, severe, dependence (HCC) Active Problems:   Severe recurrent major depression without psychotic features (HCC)   Severe recurrent major depression w/psychotic features, mood-congruent (HCC)  Total Time spent with patient: 20 minutes  Past Psychiatric History: See admission H&P  Past Medical History:  Past Medical History:  Diagnosis Date  . Alcohol abuse   . Anxiety   . Depression   . Essential hypertension 11/08/2015  . Homelessness   . Hypertension   . Obesity (BMI 30.0-34.9) 11/08/2015  . Sinus bradycardia 11/08/2015    Past Surgical History:  Procedure Laterality Date  . CHOLECYSTECTOMY    . [facility]IC [facility]PASS     [facility]  . ROTATOR CUFF REPAIR  2017  . ROUX-EN-Y PROCEDURE  2007  . SHOULDER ARTHROSCOPY WITH SUBACROMIAL DECOMPRESSION Right 04/17/2015   Procedure: SHOULDER ARTHROSCOPY ROTATOR CUFF DEBRIDEMENT WITH SUBACROMIAL DECOMPRESSION;  Surgeon: Jones BroomJustin Chandler, MD;  Location: [facility];  Service: Orthopedics;  Laterality: Right;  . TONSILLECTOMY    . wisdom teeth extractions     Family History:  Family History  Problem Relation Age of Onset  . Cancer Mother   . Peripheral Artery Disease Father   . Heart attack  Maternal Grandfather    Family Psychiatric  History: See admission H&P Social History:  Social History   Substance and Sexual Activity  Alcohol Use Yes   Comment: one fifth per day     Social History   Substance and Sexual Activity  Drug Use Not  Currently    Social History   Socioeconomic History  . Marital status: Married    Spouse name: Not on file  . Number of children: Not on file  . Years of education: Not on file  . Highest education level: Not on file  Occupational History  . Not on file  Social Needs  . Financial resource strain: Not on file  . Food insecurity:    Worry: Not on file    Inability: Not on file  . Transportation needs:    Medical: Not on file    Non-medical: Not on file  Tobacco Use  . Smoking status: Never Smoker  . Smokeless tobacco: Never Used  Substance and Sexual Activity  . Alcohol use: Yes    Comment: one fifth per day  . Drug use: Not Currently  . Sexual activity: Not Currently  Lifestyle  . Physical activity:    Days per week: Not on file    Minutes per session: Not on file  . Stress: Not on file  Relationships  . Social connections:    Talks on phone: Not on file    Gets together: Not on file    Attends religious service: Not on file    Active member of club or organization: Not on file    Attends meetings of clubs or organizations: Not on file    Relationship status: Not on file  Other Topics Concern  . Not on file  Social History Narrative  . Not on file   Additional Social History:                         Sleep: Fair  Appetite:  Good  Current Medications: Current Facility-Administered Medications  Medication Dose Route Frequency Provider Last Rate Last Dose  . acamprosate (CAMPRAL) tablet 666 mg  666 mg Oral TID Aldean Baker, NP   666 mg at 01/24/18 0454  . alum & mag hydroxide-simeth (MAALOX/MYLANTA) 200-200-20 MG/5ML suspension 30 mL  30 mL Oral Q4H PRN Nira Conn A, NP      . FLUoxetine (PROZAC) capsule 40 mg  40 mg Oral Daily Aldean Baker, NP   40 mg at 01/24/18 0981  . folic acid (FOLVITE) tablet 1 mg  1 mg Oral Daily Antonieta Pert, MD   1 mg at 01/24/18 1914  . gabapentin (NEURONTIN) capsule 100 mg  100 mg Oral TID Antonieta Pert, MD    100 mg at 01/24/18 7829  . magnesium hydroxide (MILK OF MAGNESIA) suspension 30 mL  30 mL Oral Daily PRN Nira Conn A, NP      . multivitamin with minerals tablet 1 tablet  1 tablet Oral Daily Nira Conn A, NP   1 tablet at 01/24/18 [phone]  . thiamine (VITAMIN B-1) tablet 100 mg  100 mg Oral Daily Nira Conn A, NP   100 mg at 01/24/18 3086  . traZODone (DESYREL) tablet 50 mg  50 mg Oral QHS PRN Antonieta Pert, MD   50 mg at 01/21/18 2203    Lab Results: No results found for this or any previous visit (from the past 48 hour(s)).  Blood Alcohol level:  Lab Results  Component Value Date   ETH 172 (H) 01/21/2018   ETH 280 (H) 01/21/2018    Metabolic Disorder Labs: No results found for: HGBA1C, MPG No results found for: PROLACTIN No results found for: CHOL, TRIG, HDL, CHOLHDL, VLDL, LDLCALC  Physical Findings: AIMS: Facial and Oral Movements Muscles of Facial Expression: None, normal Lips and Perioral Area: None, normal Jaw: None, normal Tongue: None, normal,Extremity Movements Upper (arms, wrists, hands, fingers): None, normal Lower (legs, knees, ankles, toes): None, normal, Trunk Movements Neck, shoulders, hips: None, normal, Overall Severity Severity of abnormal movements (highest score from questions above): None, normal Incapacitation due to abnormal movements: None, normal Patient's awareness of abnormal movements (rate only patient's report): No Awareness, Dental Status Current problems with teeth and/or dentures?: Yes Does patient usually wear dentures?: No  CIWA:  CIWA-Ar Total: 0 COWS:  COWS Total Score: 6  Musculoskeletal: Strength & Muscle Tone: within normal limits Gait & Station: normal Patient leans: N/A  Psychiatric Specialty Exam: Physical Exam  Nursing note and vitals reviewed. Constitutional: He is oriented to person, place, and time. He appears well-developed.  HENT:  Head: Normocephalic.  Respiratory: Effort normal.  Musculoskeletal: Normal  range of motion.  Neurological: He is alert and oriented to person, place, and time.    Review of Systems  Neurological: Positive for tremors (Mild tremor ETOH withdrawal- improving ).  Psychiatric/Behavioral: Positive for depression and substance abuse (Hx ETOH). Negative for hallucinations, memory loss and suicidal ideas. The patient is nervous/anxious. The patient does not have insomnia.   All other systems reviewed and are negative.   Blood pressure (!) 154/95, pulse 75, temperature 97.6 F (36.4 C), temperature source Oral, resp. rate 20, height 6\' 1"  (1.854 m), weight 104.3 kg, SpO2 100 %.Body mass index is 30.34 kg/m.  General Appearance: Casual  Eye Contact:  Good  Speech:  Clear and Coherent  Volume:  Normal  Mood:  Depressed  Affect:  Tearful  Thought Process:  Coherent  Orientation:  Full (Time, Place, and Person)  Thought Content:  WDL  Suicidal Thoughts:  No  Homicidal Thoughts:  No  Memory:  Immediate;   Good  Judgement:  Fair  Insight:  Fair  Psychomotor Activity:  Normal  Concentration:  Concentration: Good  Recall:  Good  Fund of Knowledge:  Good  Language:  Good  Akathisia:  No  Handed:  Right  AIMS (if indicated):     Assets:  Communication Skills Desire for Improvement Social Support Vocational/Educational  ADL's:  Intact  Cognition:  WNL  Sleep:  Number of Hours: 6.5     Treatment Plan Summary: Daily contact with patient to assess and evaluate symptoms and progress in treatment and Medication management  Continue with current treatment plan on 01/24/2018 as listed below except where noted  Alcohol cravings - Continue 666 mg PO from BID to TID  Alcohol withdrawal - Continue Ativan 1 mg PO Q6HR PRN CIWA > 10  Depression - Continue  Prozac from 20 to 40 mg PO daily  Anxiety - Continue gabapentin 100 mg PO TID - Increased Vistaril 25 mg to 50 mg PO Q6HR PRN anxiety  Supplementation - Continue folic acid 1 mg PO daily - Continue thiamine  100 mg PO daily  Patient will participate in the therapeutic group milieu.  Discharge disposition in progress.  CSW to continue working on discharge disposition   Oneta Rackanika N Lewis, NP 01/24/2018, 10:16 AM

## 2018-01-24 NOTE — Progress Notes (Signed)
Adult Psychoeducational Group Note  Date:  01/24/2018 Time: 8:45am-10:00am   Group Topic/Focus:  Goals Group:   The focus of this group is to help patients establish daily goals to achieve during treatment and discuss how the patient can incorporate goal setting into their daily lives to aide in recovery.  Participation Level:  Active  Participation Quality:  Appropriate and Attentive  Affect:  Appropriate  Cognitive:  Alert and  Appropriate  Insight: Appropriate and Good  Engagement in Group:  Engaged  Modes of Intervention:  Discussion and Support  Additional Comments:  Patient informed the group that he has not lived in his home with his family for a couple of years due to ongoing alcohol use and trying to do what is best for his family. Patient informed the group that his wife wants him to come home, but he is still in a space where he has been struggling and he often self-sabotages. Patient informed the group that his goal is to find a place to go when he leaves and that he needs sobriety. Patient admits that he has been able to obtain sobriety for 2 or 3 weeks at a time, but not long-term consistent sobriety.  Annye Asamani Nakiah Osgood 01/24/2018, 11:06 AM

## 2018-01-25 NOTE — [facility] Group Notes (Signed)
[facility] LCSW Group Therapy Note  Date/Time:  01/25/2018 9:00-10:00 or 10:00-11:00AM  Type of Therapy and Topic:  Group Therapy:  Healthy and Unhealthy Supports  Participation Level:  Active   Description of Group:  Patients in this group were introduced to the idea of adding a variety of healthy supports to address the various needs in their lives.Patients discussed what additional healthy supports could be helpful in their recovery and wellness after discharge in order to prevent future hospitalizations.   An emphasis was placed on using counselor, doctor, therapy groups, 12-step groups, and problem-specific support groups to expand supports.  They also worked as a group on developing a specific plan for several patients to deal with unhealthy supports through boundary-setting, psychoeducation with loved ones, and even termination of relationships.   Therapeutic Goals:   1)  discuss importance of adding supports to stay well once out of the hospital  2)  compare healthy versus unhealthy supports and identify some examples of each  3)  generate ideas and descriptions of healthy supports that can be added  4)  offer mutual support about how to address unhealthy supports  5)  encourage active participation in and adherence to discharge plan    Summary of Patient Progress:  The patient stated that current healthy supports in his life are his Alcoholics Anonymous network and family who refuse to enable his behavior while current unhealthy supports include himself.  The patient expressed a willingness to add professionals as support(s) to help in his recovery journey.   Therapeutic Modalities:   Motivational Interviewing Brief Solution-Focused Therapy  Ambrose MantleMareida Grossman-Orr, LCSW

## 2018-01-25 NOTE — [facility] Group Notes (Signed)
Adult Psychoeducational Group Note  Date:  01/25/2018 Time:  8:56 AM  Group Topic/Focus:  Goals Group:   The focus of this group is to help patients establish daily goals to achieve during treatment and discuss how the patient can incorporate goal setting into their daily lives to aide in recovery.  Participation Level:  Active  Participation Quality:  Appropriate  Affect:  Appropriate  Cognitive:  Alert  Insight: Appropriate  Engagement in Group:  Engaged  Modes of Intervention:  Orientation  Additional Comments:  Pt attended and participated in orientation/goals group. Pt goal for today is to figure out where he's going when he get discharge.    Dellia NimsJaquesha M Mikhala Kenan 01/25/2018, 8:56 AM

## 2018-01-25 NOTE — [facility] Group Notes (Signed)
[facility] Group Notes:  Nursing Psychoeducation  Date:  01/25/2018  Time:  1:30 PM  Type of Therapy:  Psychoeducational Skills   Group Topic: Life Skills Facilitated By: Patricia D. RN  Participation Level:  Active  Participation Quality:  Appropriate  Affect:  Appropriate  Cognitive:  Alert and Oriented  Insight:  Improving  Engagement in Group:  Developing/Improving  Modes of Intervention:  Discussion, Education and Socialization  Summary of Progress/Problems: Patient attended group and contributed to the discussion.  Derek [name] A Derek [name] 01/25/2018, 2:00 PM 

## 2018-01-25 NOTE — Progress Notes (Addendum)
[facility] MD Progress Note  01/25/2018 10:33 AM Derek [name]  MRN:  [id number]  Evaluation: Derek [name] observed sitting in day room watching television.  Continues to deny suicidal or homicidal ideations.  Denies auditory or visual hallucinations.  Reports taking medications as prescribed and tolerating them well.  Patient does report symptoms of worry regarding possible discharge on tomorrow.  Patient continues to need a lot of encouragement for discharge disposition planning.  As he reports he is awaiting for "social worker to do their part."  Patient reports " I have a contact at the [facility], however I wasn't sure if I was to reach out or if social worker was to."  Patient reports he has  not attempted to follow-up regarding placement.  Report " I know that she was looking into [facility] to discharge me too?" patient was encouraged to contact [facility]) as he indicates his belongings are still located at this location, with a possibility of readmission. Derek [name] was advised that discharge to be considered for 12/23.  Patient was agreeable to plan and verbalized understanding.  Patient to follow-up with social worker for additional resources, patient to make phone calls this afternoon.  Support,encouragement reassurance was provided.   History: Patient is a 57 year old male with a longstanding past psychiatric history significant for alcohol dependence who presented to the behavioral health hospital as a walk-in on 01/20/2018. The patient was brought in by 2 colleagues from [facility]ics [facility]. He was shaking significantly, and they were frightened that he might be going into alcohol withdrawal. The patient stated that he had been drinking on and off for the last several months. He was discharged from our facility in November of this year. He had been staying at an [facility] house recently, but was kicked out when he was caught drinking. Since then he has been sleeping outside, or sleeping in  unlocked cars. He reported having the same close on for 6 days. He was sent to the emergency room for evaluation. Laboratories were obtained. His blood alcohol was 172. His drug screen was negative. It was arranged for him to be transported back to the behavioral health hospital for evaluation and stabilization. The patient stated that he was having suicidal ideation because of his lack of ability to remain sober. During evaluation today he denied active suicidal ideation. Review of the electronic medical record revealed multiple emergency room visits as well as admissions for alcohol related issues. His last admission to our facility was on November/15/2018.     Principal Problem: Alcohol use disorder, severe, dependence (HCC) Diagnosis: Principal Problem:   Alcohol use disorder, severe, dependence (HCC) Active Problems:   Severe recurrent major depression without psychotic features (HCC)   Severe recurrent major depression w/psychotic features, mood-congruent (HCC)  Total Time spent with patient: 20 minutes  Past Psychiatric History: See admission H&P  Past Medical History:  Past Medical History:  Diagnosis Date  . Alcohol abuse   . Anxiety   . Depression   . Essential hypertension 11/08/2015  . Homelessness   . Hypertension   . Obesity (BMI 30.0-34.9) 11/08/2015  . Sinus bradycardia 11/08/2015    Past Surgical History:  Procedure Laterality Date  . CHOLECYSTECTOMY    . GASTRIC BYPASS     2006 - done at [facility]  . ROTATOR CUFF REPAIR  2017  . ROUX-EN-Y PROCEDURE  2007  . SHOULDER ARTHROSCOPY WITH SUBACROMIAL DECOMPRESSION Right 04/17/2015   Procedure: SHOULDER ARTHROSCOPY ROTATOR CUFF DEBRIDEMENT WITH SUBACROMIAL DECOMPRESSION;  Surgeon: Jones Broom,  MD;  Location: [facility];  Service: Orthopedics;  Laterality: Right;  . TONSILLECTOMY    . wisdom teeth extractions     Family History:  Family History  Problem Relation Age of Onset  . Cancer Mother    . Peripheral Artery Disease Father   . Heart attack Maternal Grandfather    Family Psychiatric  History: See admission H&P Social History:  Social History   Substance and Sexual Activity  Alcohol Use Yes   Comment: one fifth per day     Social History   Substance and Sexual Activity  Drug Use Not Currently    Social History   Socioeconomic History  . Marital status: Married    Spouse name: Not on file  . Number of children: Not on file  . Years of education: Not on file  . Highest education level: Not on file  Occupational History  . Not on file  Social Needs  . Financial resource strain: Not on file  . Food insecurity:    Worry: Not on file    Inability: Not on file  . Transportation needs:    Medical: Not on file    Non-medical: Not on file  Tobacco Use  . Smoking status: Never Smoker  . Smokeless tobacco: Never Used  Substance and Sexual Activity  . Alcohol use: Yes    Comment: one fifth per day  . Drug use: Not Currently  . Sexual activity: Not Currently  Lifestyle  . Physical activity:    Days per week: Not on file    Minutes per session: Not on file  . Stress: Not on file  Relationships  . Social connections:    Talks on phone: Not on file    Gets together: Not on file    Attends religious service: Not on file    Active member of club or organization: Not on file    Attends meetings of clubs or organizations: Not on file    Relationship status: Not on file  Other Topics Concern  . Not on file  Social History Narrative  . Not on file   Additional Social History:                         Sleep: Fair  Appetite:  Good  Current Medications: Current Facility-Administered Medications  Medication Dose Route Frequency Provider Last Rate Last Dose  . acamprosate (CAMPRAL) tablet 666 mg  666 mg Oral TID Aldean BakerSykes, Janet E, NP   666 mg at 01/25/18 0754  . alum & mag hydroxide-simeth (MAALOX/MYLANTA) 200-200-20 MG/5ML suspension 30 mL  30 mL Oral  Q4H PRN Nira ConnBerry, Jason A, NP      . FLUoxetine (PROZAC) capsule 40 mg  40 mg Oral Daily Aldean BakerSykes, Janet E, NP   40 mg at 01/25/18 0754  . folic acid (FOLVITE) tablet 1 mg  1 mg Oral Daily Antonieta Pertlary, Greg Lawson, MD   1 mg at 01/25/18 0754  . gabapentin (NEURONTIN) capsule 100 mg  100 mg Oral TID Antonieta Pertlary, Greg Lawson, MD   100 mg at 01/25/18 0754  . hydrOXYzine (ATARAX/VISTARIL) tablet 50 mg  50 mg Oral Q6H PRN Oneta RackLewis, Latisha Lasch N, NP   50 mg at 01/25/18 0759  . magnesium hydroxide (MILK OF MAGNESIA) suspension 30 mL  30 mL Oral Daily PRN Nira ConnBerry, Jason A, NP      . multivitamin with minerals tablet 1 tablet  1 tablet Oral Daily Jackelyn PolingBerry, Jason A, NP  1 tablet at 01/25/18 0754  . thiamine (VITAMIN B-1) tablet 100 mg  100 mg Oral Daily Nira Conn A, NP   100 mg at 01/25/18 0754  . traZODone (DESYREL) tablet 50 mg  50 mg Oral QHS PRN Antonieta Pert, MD   50 mg at 01/21/18 2203    Lab Results: No results found for this or any previous visit (from the past 48 hour(s)).  Blood Alcohol level:  Lab Results  Component Value Date   ETH 172 (H) 01/21/2018   ETH 280 (H) 01/21/2018    Metabolic Disorder Labs: No results found for: HGBA1C, MPG No results found for: PROLACTIN No results found for: CHOL, TRIG, HDL, CHOLHDL, VLDL, LDLCALC  Physical Findings: AIMS: Facial and Oral Movements Muscles of Facial Expression: None, normal Lips and Perioral Area: None, normal Jaw: None, normal Tongue: None, normal,Extremity Movements Upper (arms, wrists, hands, fingers): None, normal Lower (legs, knees, ankles, toes): None, normal, Trunk Movements Neck, shoulders, hips: None, normal, Overall Severity Severity of abnormal movements (highest score from questions above): None, normal Incapacitation due to abnormal movements: None, normal Patient's awareness of abnormal movements (rate only patient's report): No Awareness, Dental Status Current problems with teeth and/or dentures?: Yes Does patient usually wear  dentures?: No  CIWA:  CIWA-Ar Total: 0 COWS:  COWS Total Score: 6  Musculoskeletal: Strength & Muscle Tone: within normal limits Gait & Station: normal Patient leans: N/A  Psychiatric Specialty Exam: Physical Exam  Nursing note and vitals reviewed. Constitutional: He is oriented to person, place, and time. He appears well-developed.  HENT:  Head: Normocephalic.  Respiratory: Effort normal.  Musculoskeletal: Normal range of motion.  Neurological: He is alert and oriented to person, place, and time.    Review of Systems  Neurological: Tremors: Mild tremor ETOH withdrawal- improving   Psychiatric/Behavioral: Positive for depression and substance abuse (Hx ETOH). Negative for hallucinations, memory loss and suicidal ideas. The patient is nervous/anxious. The patient does not have insomnia.   All other systems reviewed and are negative.   Blood pressure (!) 131/94, pulse 72, temperature 97.7 F (36.5 C), temperature source Oral, resp. rate 20, height 6\' 1"  (1.854 m), weight 104.3 kg, SpO2 100 %.Body mass index is 30.34 kg/m.  General Appearance: Casual  Eye Contact:  Good  Speech:  Clear and Coherent  Volume:  Normal  Mood:  Depressed  Affect:  Tearful  Thought Process:  Coherent  Orientation:  Full (Time, Place, and Person)  Thought Content:  WDL  Suicidal Thoughts:  No  Homicidal Thoughts:  No  Memory:  Immediate;   Good  Judgement:  Fair  Insight:  Fair  Psychomotor Activity:  Normal  Concentration:  Concentration: Good  Recall:  Good  Fund of Knowledge:  Good  Language:  Good  Akathisia:  No  Handed:  Right  AIMS (if indicated):     Assets:  Communication Skills Desire for Improvement Social Support Vocational/Educational  ADL's:  Intact  Cognition:  WNL  Sleep:  Number of Hours: 6     Treatment Plan Summary: Daily contact with patient to assess and evaluate symptoms and progress in treatment and Medication management  Continue with current treatment plan  on 01/25/2018 as listed below except where noted  Alcohol cravings - Continue 666 mg PO from BID to TID  Alcohol withdrawal - Continue Ativan 1 mg PO Q6HR PRN CIWA > 10  Depression - Continue  Prozac 40 mg PO daily  Anxiety - Continue gabapentin 100 mg PO  TID -Continue Vistaril  50 mg PO Q6HR PRN anxiety  Supplementation - Continue folic acid 1 mg PO daily - Continue thiamine 100 mg PO daily  Patient will participate in the therapeutic group milieu.  Discharge disposition in progress.  CSW to continue working on discharge disposition   Oneta Rackanika N Valerya Maxton, NP 01/25/2018, 10:33 AM

## 2018-01-25 NOTE — Progress Notes (Signed)
D Pt is observed OOB UAL on the 300 hall today, he tolerates this well. HE is pleasant, he is cooperative. HE maintains good eye contact. His hygiene is appropriate and his dress is clean. He speaks about what he has learned about his  addicitions- food and alcohol. He talks about the things he has learned - through his research - related to his feelings and how they impact his unhealthy life choices. He talks about how hard he finds it to forgive himself- to maintain his marriage-mto let go of yesterday.    A HE completed his daily assessment and on this he wrote he denied SI today and he rated his depression, hopelessness and anxeity " 5/6/7", respectively. HE attends his groups and is actively engaged in processing and learning healtheir coping skills.     R Safety is in place Estate manager/land agentand writer offers encouragement.

## 2018-01-25 NOTE — Progress Notes (Addendum)
Patient has been observed up in the dayroom interacting with peers. She attended group and was compliant with her medications. She reports that she has had a good day. She reports hopefully getting into a treatment program once discharged. Support given and safety maintained on unit  With 15 min checks.

## 2018-01-26 MED ORDER — GABAPENTIN 100 MG PO CAPS: 100.0000 mg | ORAL_CAPSULE | Freq: Three times a day (TID) | ORAL | 0 refills | Status: DC

## 2018-01-26 MED ORDER — ADULT MULTIVITAMIN W/MINERALS CH: 1.0000 | ORAL_TABLET | Freq: Every day | ORAL | Status: AC

## 2018-01-26 MED ORDER — BUPROPION HCL ER (XL) 150 MG PO TB24: 150.0000 mg | ORAL_TABLET | Freq: Every day | ORAL | 0 refills | Status: DC

## 2018-01-26 MED ORDER — HYDROXYZINE HCL 50 MG PO TABS: 50.0000 mg | ORAL_TABLET | Freq: Four times a day (QID) | ORAL | 0 refills | Status: DC | PRN

## 2018-01-26 MED ORDER — FLUOXETINE HCL 40 MG PO CAPS: 40.0000 mg | ORAL_CAPSULE | Freq: Every day | ORAL | 0 refills | Status: DC

## 2018-01-26 MED ORDER — ACAMPROSATE CALCIUM 333 MG PO TBEC: 666.0000 mg | DELAYED_RELEASE_TABLET | Freq: Three times a day (TID) | ORAL | 0 refills | Status: DC

## 2018-01-26 MED ORDER — TRAZODONE HCL 50 MG PO TABS: 50.0000 mg | ORAL_TABLET | Freq: Every evening | ORAL | 0 refills | Status: DC | PRN

## 2018-01-26 NOTE — Discharge Summary (Signed)
Physician Discharge Summary Note  Patient:  Derek [name] is an 57 y.o., male  MRN:  [id number]  DOB:  09-06-1960  Patient phone:  [phone] (home)   Patient address:   [address] [place] [address],   Total Time spent with patient: Greater than 30 minutes  Date of Admission:  01/21/2018  Date of Discharge: 01-26-18  Reason for Admission: Alcohol intoxication, withdrawal symptoms & suicidal ideations.  Principal Problem: Alcohol use disorder, severe, dependence (HCC)  Discharge Diagnoses: Principal Problem:   Alcohol use disorder, severe, dependence (HCC) Active Problems:   Severe recurrent major depression without psychotic features (HCC)   Severe recurrent major depression w/psychotic features, mood-congruent (HCC)  Past Psychiatric History: Alcoholism, Severe, major depressive disorder.  Past Medical History:  Past Medical History:  Diagnosis Date  . Alcohol abuse   . Anxiety   . Depression   . Essential hypertension 11/08/2015  . Homelessness   . Hypertension   . Obesity (BMI 30.0-34.9) 11/08/2015  . Sinus bradycardia 11/08/2015    Past Surgical History:  Procedure Laterality Date  . CHOLECYSTECTOMY    . GASTRIC BYPASS     2006 - done at [facility]  . ROTATOR CUFF REPAIR  2017  . ROUX-EN-Y PROCEDURE  2007  . SHOULDER ARTHROSCOPY WITH SUBACROMIAL DECOMPRESSION Right 04/17/2015   Procedure: SHOULDER ARTHROSCOPY ROTATOR CUFF DEBRIDEMENT WITH SUBACROMIAL DECOMPRESSION;  Surgeon: Jones Broom, MD;  Location: [facility];  Service: Orthopedics;  Laterality: Right;  . TONSILLECTOMY    . wisdom teeth extractions     Family History:  Family History  Problem Relation Age of Onset  . Cancer Mother   . Peripheral Artery Disease Father   . Heart attack Maternal Grandfather    Family Psychiatric  History: See H&P  Social History:  Social History   Substance and Sexual Activity  Alcohol Use Yes   Comment: one fifth per day     Social History    Substance and Sexual Activity  Drug Use Not Currently    Social History   Socioeconomic History  . Marital status: Married    Spouse name: Not on file  . Number of children: Not on file  . Years of education: Not on file  . Highest education level: Not on file  Occupational History  . Not on file  Social Needs  . Financial resource strain: Not on file  . Food insecurity:    Worry: Not on file    Inability: Not on file  . Transportation needs:    Medical: Not on file    Non-medical: Not on file  Tobacco Use  . Smoking status: Never Smoker  . Smokeless tobacco: Never Used  Substance and Sexual Activity  . Alcohol use: Yes    Comment: one fifth per day  . Drug use: Not Currently  . Sexual activity: Not Currently  Lifestyle  . Physical activity:    Days per week: Not on file    Minutes per session: Not on file  . Stress: Not on file  Relationships  . Social connections:    Talks on phone: Not on file    Gets together: Not on file    Attends religious service: Not on file    Active member of club or organization: Not on file    Attends meetings of clubs or organizations: Not on file    Relationship status: Not on file  Other Topics Concern  . Not on file  Social History Narrative  .  Not on file   Hospital Course: (Per Md's admission evaluation): Patient is a 57 year old male with a longstanding past psychiatric history significant for alcohol dependence who presented to the behavioral health hospital as a walk-in on 01/20/2018. The patient was brought in by 2 colleagues from [facility]. He was shaking significantly, and they were frightened that he might be going into alcohol withdrawal. The patient stated that he had been drinking on and off for the last several months. He was discharged from our facility in November of this year. He had been staying at an [place] house recently, but was kicked out when he was caught drinking. Since then he has been  sleeping outside, or sleeping in unlocked cars. He reported having the same close on for 6 days. He was sent to the emergency room for evaluation. Laboratories were obtained. His blood alcohol was 172. His drug screen was negative. It was arranged for him to be transported back to the behavioral health hospital for evaluation and stabilization. The patient stated that he was having suicidal ideation because of his lack of ability to remain sober. During evaluation today he denied active suicidal ideation. Review of the electronic medical record revealed multiple emergency room visits as well as admissions for alcohol related issues. His last admission to our facility was on November/15/2018.  Upon [facility], Derek [name] was evaluated & his presenting symptoms identified. His BAL upon admission was 172 per toxicology test reports & UDS was negative of all substances. Chart review reports indicated that he does have chronic alcoholism & has been living in & out of [place]. Although [facility], he was medically [facility] & [facility]. Derek [name] did not receive any detoxification treatments as he presented no alcohol withdrawal symptoms throughout his hospital stay. He was treated for his worsening symptoms of depression/alcohol cravings. He received & was discharged on; Campral 666 mg for alcoholism, Fluoxetine 40 mg for depression, Gabapentin 100 mg for agitation/substance withdrawal symptoms, Hydroxyzine 50 mg prn for anxiety & Trazodone 50 mr prn for insomnia. He was rather enrolled & encouraged to participate in the unit the group counseling sessions being offered & held on this unit. He learned coping skills. He presented no other significant health issues that required treatment or monitoring. He tolerated his treatment regimen without any adverse effects or reactions reported.  During the  course of his hospitalization, Derek [name] was evaluated on daily basis by the clinical providers to assure his response to his treatment regimen.As his treatment progressed, improvement was noted as evidenced by his reports of decreasing symptoms, improved mood, medication tolerance & active participation in the unit programming.He was encouraged to update his providers on his progress by daily completion of a self-inventory assessment, noting mood, mental status, any new symptoms, anxiety & or concerns. Lu[place]ne's symptoms responded well to his treatment regimen combined with a therapeutic & supportive environment. He also worked closely with the treatment team & case manager to develop a discharge plan with appropriate goals to maintain sobriety/mood stability after discharge.   Upon this hospital discharge, Derek [name] was in much improved condition than upon admission.His symptoms were reported as significantly decreased or resolved completely. He adamantly denies any SI/HI,  AVH, delusional thoughts, paranoia or substance withdrawal symptoms. He was motivated to continue taking medication with a goal of continued improvement in mental health. Derek [name] will continue psychiatric care/substance abuse  treatment on an outpatient basis as noted below. He was provided with all the necessary information required to make this appointment without problems. He was able to engage in safety planning including [facility] or contact emergency services if he feels unable to maintain his own safety or the safety of others. Pt had no further questions, comments or concerns.    This patient is currently at low risk of imminent suicide. Patient denies thoughts, intent, or plan for harm to herself or others, expressed significant future orientation, and expressed an ability to mobilize assistance for her needs. She is presently void of any contributing psychiatric symptoms, cognitive difficulties, or substance use which would  elevate her risk for lethality. Chronic risk for lethality is elevated in light of poor social support, poor adherence, and impulsivity. The chronic risk is presently mitigated by her ongoing desire and engagement [facility] and mobilization of support from family and friends. Chronic risk may elevate if she experiences any significant loss or worsening of symptoms, which can be managed and monitored through outpatient providers. At this time, acute risk for lethality is low and she is stable for ongoing outpatient management.    Modifiable risk factors were addressed during this hospitalization through appropriate pharmacotherapy and establishment of outpatient follow-up treatment. Some risk factors for suicide are situational (i.e. Unstable social support) or related personality pathology (i.e. Poor coping mechanisms) and thus cannot be further mitigated by continued hospitalization in this setting. [facility] with [facility] personal belongings in no apparent distress.He was able to engage in safety planning including plan to [facility] or contact emergency services if he feels unable to maintain his own safety or the safety of others. Pt had no further questions, comments or concerns.[facility] with all personal belongings in no apparent distress.  Physical Findings: AIMS: Facial and Oral Movements Muscles of Facial Expression: None, normal Lips and Perioral Area: None, normal Jaw: None, normal Tongue: None, normal,Extremity Movements Upper (arms, wrists, hands, fingers): None, normal Lower (legs, knees, ankles, toes): None, normal, Trunk Movements Neck, shoulders, hips: None, normal, Overall Severity Severity of abnormal movements (highest score from questions above): None, normal Incapacitation due to abnormal movements: None, normal Patient's awareness of abnormal movements (rate only patient's report): No Awareness, Dental Status Current problems with teeth and/or dentures?: Yes Does  patient usually wear dentures?: No  CIWA:  CIWA-Ar Total: 0 COWS:  COWS Total Score: 6  Musculoskeletal: Strength & Muscle Tone: within normal limits Gait & Station: normal Patient leans: N/A  Psychiatric Specialty Exam: Physical Exam  Nursing note and vitals reviewed. Constitutional: He appears well-developed.  HENT:  Head: Normocephalic.  Eyes: Pupils are equal, round, and reactive to light.  Neck: Normal range of motion.  Respiratory: Effort normal.  GI: Soft.  Genitourinary:    Genitourinary Comments: Deferred   Musculoskeletal: Normal range of motion.  Neurological: He is alert.  Skin: Skin is warm.    Review of Systems  Constitutional: Negative.   HENT: Negative.   Eyes: Negative.   Respiratory: Negative.  Negative for cough and shortness of breath.   Cardiovascular: Negative.  Negative for chest pain and palpitations.  Gastrointestinal: Negative.  Negative for abdominal pain, heartburn, nausea and vomiting.  Genitourinary: Negative.   Musculoskeletal: Negative.   Skin: Negative.   Neurological: Negative.  Negative for dizziness and headaches.  Endo/Heme/Allergies: Negative.   Psychiatric/Behavioral: Positive for depression (Stable) and substance abuse (Hx, alcoholism, chronic (Stable)). Negative for hallucinations, memory  loss and suicidal ideas. The patient is nervous/anxious (Stable) and has insomnia (Stable).     Blood pressure (!) 135/96, pulse 71, temperature 97.8 F (36.6 C), temperature source Oral, resp. rate 16, height 6\' 1"  (1.854 m), weight 104.3 kg, SpO2 100 %.Body mass index is 30.34 kg/m.  See Md's SRA   Have you used any form of tobacco in the last 30 days? (Cigarettes, Smokeless Tobacco, Cigars, and/or Pipes): No  Has this patient used any form of tobacco in the last 30 days? (Cigarettes, Smokeless Tobacco, Cigars, and/or Pipes): N/A  Blood Alcohol level:  Lab Results  Component Value Date   ETH 172 (H) 01/21/2018   ETH 280 (H) 01/21/2018    Metabolic Disorder Labs:  No results found for: HGBA1C, MPG No results found for: PROLACTIN No results found for: CHOL, TRIG, HDL, CHOLHDL, VLDL, LDLCALC  See Psychiatric Specialty Exam and Suicide Risk Assessment completed by Attending Physician prior to discharge.  Discharge destination:  Home  Is patient on multiple antipsychotic therapies at discharge:  No   Has Patient had three or more failed trials of antipsychotic monotherapy by history:  No  Recommended Plan for Multiple Antipsychotic Therapies: NA  Allergies as of 01/26/2018   No Known Allergies     Medication List    STOP taking these medications   buPROPion 150 MG 24 hr tablet Commonly known as:  WELLBUTRIN XL   elvitegravir-cobicistat-emtricitabine-tenofovir 150-150-200-10 MG Tabs tablet Commonly known as:  GENVOYA   ibuprofen 200 MG tablet Commonly known as:  ADVIL,MOTRIN     TAKE these medications     Indication  acamprosate 333 MG tablet Commonly known as:  CAMPRAL Take 2 tablets (666 mg total) by mouth 3 (three) times daily. For alcoholism  Indication:  Excessive Use of Alcohol   FLUoxetine 40 MG capsule Commonly known as:  PROZAC Take 1 capsule (40 mg total) by mouth daily. For depression What changed:  additional instructions  Indication:  Major Depressive Disorder   gabapentin 100 MG capsule Commonly known as:  NEURONTIN Take 1 capsule (100 mg total) by mouth 3 (three) times daily. For agitation What changed:  additional instructions  Indication:  Agitation, Alcohol Withdrawal Syndrome   hydrOXYzine 50 MG tablet Commonly known as:  ATARAX/VISTARIL Take 1 tablet (50 mg total) by mouth every 6 (six) hours as needed for anxiety (anxiety/agitation).  Indication:  Feeling Anxious   multivitamin with minerals Tabs tablet Take 1 tablet by mouth daily. Vitamin supplement Start taking on:  January 27, 2018 What changed:  additional instructions  Indication:  Vitamin supplement   traZODone  50 MG tablet Commonly known as:  DESYREL Take 1 tablet (50 mg total) by mouth at bedtime as needed for sleep.  Indication:  Trouble Sleeping      Follow-up [employer]. Go on 02/02/2018.   Specialty:  Behavioral Health Why:  Your hospital follow up appointment is Monday, 02/02/18 at 8:00a. Please bring: photo ID, proof of insurance, social security card, and discharge paperwork from this hospitalization.  [address] [place] [address] [phone]          Follow-up recommendations: Activity:  As tolerated Diet: As recommended by your primary care doctor. Keep all scheduled follow-up appointments as recommended.  Comments: Patient is instructed prior to discharge to: Take all medications as prescribed by his/her mental healthcare provider. Report any adverse effects and or reactions from the medicines to his/her outpatient provider promptly. Patient has been instructed &  cautioned: To not engage in alcohol and or illegal drug use while on prescription medicines. In the event of worsening symptoms, patient is instructed to call the crisis hotline, 911 and or go to the nearest ED for appropriate evaluation and treatment of symptoms. To follow-up with his/her primary care provider for your other medical issues, concerns and or health care needs.   Signed: Armandina Stammer, NP, PMHNP, FNP-BC 01/26/2018, 9:31 AM

## 2018-01-26 NOTE — Progress Notes (Signed)
Patient discharged to lobby. Patient was stable and appreciative at that time. All papers and prescriptions were given and valuables returned. Verbal understanding expressed. Denies SI/HI and A/VH. Patient given opportunity to express concerns and ask questions.  

## 2018-01-26 NOTE — Tx Team (Signed)
Interdisciplinary Treatment and Diagnostic Plan Update  01/26/2018 Time of Session: [id number] Derek [name] MRN: [id number]  Principal Diagnosis: Alcohol use disorder, severe, dependence (HCC)  Secondary Diagnoses: Principal Problem:   Alcohol use disorder, severe, dependence (HCC) Active Problems:   Severe recurrent major depression without psychotic features (HCC)   Severe recurrent major depression w/psychotic features, mood-congruent (HCC)   Current Medications:  Current Facility-Administered Medications  Medication Dose Route Frequency Provider Last Rate Last Dose  . acamprosate (CAMPRAL) tablet 666 mg  666 mg Oral TID Aldean Baker, NP   666 mg at 01/26/18 0816  . alum & mag hydroxide-simeth (MAALOX/MYLANTA) 200-200-20 MG/5ML suspension 30 mL  30 mL Oral Q4H PRN Nira Conn A, NP      . FLUoxetine (PROZAC) capsule 40 mg  40 mg Oral Daily Aldean Baker, NP   40 mg at 01/26/18 0816  . folic acid (FOLVITE) tablet 1 mg  1 mg Oral Daily Antonieta Pert, MD   1 mg at 01/26/18 0816  . gabapentin (NEURONTIN) capsule 100 mg  100 mg Oral TID Antonieta Pert, MD   100 mg at 01/26/18 0816  . hydrOXYzine (ATARAX/VISTARIL) tablet 50 mg  50 mg Oral Q6H PRN Oneta Rack, NP   50 mg at 01/26/18 0815  . magnesium hydroxide (MILK OF MAGNESIA) suspension 30 mL  30 mL Oral Daily PRN Nira Conn A, NP      . multivitamin with minerals tablet 1 tablet  1 tablet Oral Daily Nira Conn A, NP   1 tablet at 01/26/18 0815  . thiamine (VITAMIN B-1) tablet 100 mg  100 mg Oral Daily Nira Conn A, NP   100 mg at 01/26/18 4696  . traZODone (DESYREL) tablet 50 mg  50 mg Oral QHS PRN Antonieta Pert, MD   50 mg at 01/21/18 2203   PTA Medications: Medications Prior to Admission  Medication Sig Dispense Refill Last Dose  . buPROPion (WELLBUTRIN XL) 150 MG 24 hr tablet Take 1 tablet (150 mg total) daily by mouth. (Patient not taking: Reported on 01/07/2018) 30 tablet 0 Not Taking at Unknown time  .  elvitegravir-cobicistat-emtricitabine-tenofovir (GENVOYA) 150-150-200-10 MG TABS tablet Take 1 tablet by mouth daily with breakfast. (Patient not taking: Reported on 11/13/2017) 5 tablet 0 Not Taking at Unknown time  . FLUoxetine (PROZAC) 40 MG capsule Take 1 capsule (40 mg total) daily by mouth. (Patient not taking: Reported on 01/07/2018) 30 capsule 0 Not Taking at Unknown time  . gabapentin (NEURONTIN) 100 MG capsule Take 1 capsule (100 mg total) 3 (three) times daily by mouth. (Patient not taking: Reported on 01/07/2018) 90 capsule 0 Not Taking at Unknown time  . ibuprofen (ADVIL,MOTRIN) 200 MG tablet Take 400 mg by mouth every 6 (six) hours as needed for moderate pain.   Past Month at Unknown time  . Multiple Vitamin (MULTIVITAMIN WITH MINERALS) TABS tablet Take 1 tablet by mouth daily.   Past Week at Unknown time    Patient Stressors: Financial difficulties Marital or family conflict Substance abuse  Patient Strengths: Ability for insight Average or above average intelligence Motivation for treatment/growth Supportive family/friends  Treatment Modalities: Medication Management, Group therapy, Case management,  1 to 1 session with clinician, Psychoeducation, Recreational therapy.   Physician Treatment Plan for Primary Diagnosis: Alcohol use disorder, severe, dependence (HCC) Long Term Goal(s): Improvement in symptoms so as ready for discharge Improvement in symptoms so as ready for discharge   Short Term Goals: Ability to identify changes in  lifestyle to reduce recurrence of condition will improve Ability to verbalize feelings will improve Ability to disclose and discuss suicidal ideas Ability to demonstrate self-control will improve Ability to identify and develop effective coping behaviors will improve Ability to maintain clinical measurements within normal limits will improve Compliance with prescribed medications will improve Ability to identify triggers associated with  substance abuse/mental health issues will improve Ability to identify changes in lifestyle to reduce recurrence of condition will improve Ability to verbalize feelings will improve Ability to disclose and discuss suicidal ideas Ability to demonstrate self-control will improve Ability to identify and develop effective coping behaviors will improve Ability to maintain clinical measurements within normal limits will improve Compliance with prescribed medications will improve Ability to identify triggers associated with substance abuse/mental health issues will improve  Medication Management: Evaluate patient's response, side effects, and tolerance of medication regimen.  Therapeutic Interventions: 1 to 1 sessions, Unit Group sessions and Medication administration.  Evaluation of Outcomes: Adequate for discharge.  Physician Treatment Plan for Secondary Diagnosis: Principal Problem:   Alcohol use disorder, severe, dependence (HCC) Active Problems:   Severe recurrent major depression without psychotic features (HCC)   Severe recurrent major depression w/psychotic features, mood-congruent (HCC)  Long Term Goal(s): Improvement in symptoms so as ready for discharge Improvement in symptoms so as ready for discharge   Short Term Goals: Ability to identify changes in lifestyle to reduce recurrence of condition will improve Ability to verbalize feelings will improve Ability to disclose and discuss suicidal ideas Ability to demonstrate self-control will improve Ability to identify and develop effective coping behaviors will improve Ability to maintain clinical measurements within normal limits will improve Compliance with prescribed medications will improve Ability to identify triggers associated with substance abuse/mental health issues will improve Ability to identify changes in lifestyle to reduce recurrence of condition will improve Ability to verbalize feelings will improve Ability to  disclose and discuss suicidal ideas Ability to demonstrate self-control will improve Ability to identify and develop effective coping behaviors will improve Ability to maintain clinical measurements within normal limits will improve Compliance with prescribed medications will improve Ability to identify triggers associated with substance abuse/mental health issues will improve     Medication Management: Evaluate patient's response, side effects, and tolerance of medication regimen.  Therapeutic Interventions: 1 to 1 sessions, Unit Group sessions and Medication administration.  Evaluation of Outcomes: Adequate for discharge  RN Treatment Plan for Primary Diagnosis: Alcohol use disorder, severe, dependence (HCC) Long Term Goal(s): Knowledge of disease and therapeutic regimen to maintain health will improve  Short Term Goals: Ability to participate in decision making will improve, Ability to verbalize feelings will improve, Ability to disclose and discuss suicidal ideas, Ability to identify and develop effective coping behaviors will improve and Compliance with prescribed medications will improve  Medication Management: RN will administer medications as ordered by provider, will assess and evaluate patient's response and provide education to patient for prescribed medication. RN will report any adverse and/or side effects to prescribing provider.  Therapeutic Interventions: 1 on 1 counseling sessions, Psychoeducation, Medication administration, Evaluate responses to treatment, Monitor vital signs and CBGs as ordered, Perform/monitor CIWA, COWS, AIMS and Fall Risk screenings as ordered, Perform wound care treatments as ordered.  Evaluation of Outcomes: Adequate for discharge  LCSW Treatment Plan for Primary Diagnosis: Alcohol use disorder, severe, dependence (HCC) Long Term Goal(s): Safe transition to appropriate next level of care at discharge, Engage patient in therapeutic group addressing  interpersonal concerns.  Short Term Goals: Engage  patient in aftercare planning with referrals and resources  Therapeutic Interventions: Assess for all discharge needs, 1 to 1 time with Social worker, Explore available resources and support systems, Assess for adequacy in community support network, Educate family and significant other(s) on suicide prevention, Complete Psychosocial Assessment, Interpersonal group therapy.  Evaluation of Outcomes: Adequate for discharge   Progress in Treatment: Attending groups: Yes Participating in groups: Yes Taking medication as prescribed: Yes. Toleration medication: Yes. Family/Significant other contact made: SPE completed with pt; contact attempts made with pt's wife.  Patient understands diagnosis: Yes. Discussing patient identified problems/goals with staff: Yes. Medical problems stabilized or resolved: Yes. Denies suicidal/homicidal ideation: No. Issues/concerns per patient self-inventory: No. Other:   New problem(s) identified: n/a  New Short Term/Long Term Goal(s):, medication stabilization, elimination of SI thoughts, development of comprehensive mental wellness plan.   Patient Goals:  "to get back on meds and get back into an [facility]."   Discharge Plan or Barriers: Pt [facility] MHAG pamphlet, Mobile Crisis information, and AA/NA information provided to patient for additional community support and resources.   Reason for Continuation of Hospitalization: none  Estimated Length of Stay: today, 01/26/18  Attendees: Patient: 01/26/2018 8:40 AM  Physician: Dr. Landry MellowGreg Clary, MD 01/26/2018 8:40 AM  Nursing: Lanora ManisElizabeth RN 01/26/2018 8:40 AM  RN Care Manager:x 01/26/2018 8:40 AM  Social Worker: Corrie MckusickHeather Arisa Congleton LCSW 01/26/2018 8:40 AM  Recreational Therapist: Juliann ParesX 01/26/2018 8:40 AM  Other: Marciano SequinJanet Sykes NP 01/26/2018 8:40 AM  Other:  01/26/2018 8:40 AM  Other: 01/26/2018 8:40 AM    Scribe  for Treatment Team: Rona RavensHeather S Krishana Lutze, LCSW 01/26/2018 8:40 AM

## 2018-01-26 NOTE — [facility] Suicide Risk Assessment (Signed)
[facility]   Principal Problem: Alcohol use disorder, severe, dependence (HCC) Discharge Diagnoses: Principal Problem:   Alcohol use disorder, severe, dependence (HCC) Active Problems:   Severe recurrent major depression without psychotic features (HCC)   Severe recurrent major depression w/psychotic features, mood-congruent (HCC)   Total Time spent with patient: 15 minutes  Musculoskeletal: Strength & Muscle Tone: within normal limits Gait & Station: normal Patient leans: N/A  Psychiatric Specialty Exam: Review of Systems  All other systems reviewed and are negative.   Blood pressure (!) 135/96, pulse 71, temperature 97.8 F (36.6 C), temperature source Oral, resp. rate 16, height 6\' 1"  (1.854 m), weight 104.3 kg, SpO2 100 %.Body mass index is 30.34 kg/m.  General Appearance: Casual  Eye Contact::  Good  Speech:  Normal Rate409  Volume:  Normal  Mood:  Euthymic  Affect:  Congruent  Thought Process:  Coherent and Descriptions of Associations: Intact  Orientation:  Full (Time, Place, and Person)  Thought Content:  Logical  Suicidal Thoughts:  No  Homicidal Thoughts:  No  Memory:  Immediate;   Fair Recent;   Fair Remote;   Fair  Judgement:  Intact  Insight:  Fair  Psychomotor Activity:  Normal  Concentration:  Fair  Recall:  Good  Fund of Knowledge:Good  Language: Good  Akathisia:  Negative  Handed:  Right  AIMS (if indicated):     Assets:  Communication Skills Desire for Improvement Leisure Time Physical Health Resilience  Sleep:  Number of Hours: 6.75  Cognition: WNL  ADL's:  Intact   Mental Status Per Nursing Assessment::   On Admission:  NA  Demographic Factors:  Male, Caucasian, Low socioeconomic status, Living alone and Unemployed  Loss Factors: Financial problems/change in socioeconomic status  Historical Factors: Prior suicide attempts and Impulsivity  Risk Reduction Factors:   Sense of responsibility to family  and Positive coping skills or problem solving skills  Continued Clinical Symptoms:  Depression:   Comorbid alcohol abuse/dependence Impulsivity Alcohol/Substance Abuse/Dependencies  Cognitive Features That Contribute To Risk:  None    Suicide Risk:  Minimal: No identifiable suicidal ideation.  Patients presenting with no risk factors but with morbid ruminations; may be classified as minimal risk based on the severity of the depressive symptoms  Follow-up Information    [facility]. Go on 02/02/2018.   Specialty:  Behavioral Health Why:  Your hospital follow up appointment is Monday, 02/02/18 at 8:00a. Please bring: photo ID, proof of insurance, social security card, and discharge paperwork from this hospitalization.  Contact information: [address] [place] [address] [phone]           Plan Of Care/Follow-up recommendations:  Activity:  ad lib  Antonieta PertGreg Lawson Clary, MD 01/26/2018, 10:24 AM

## 2018-01-26 NOTE — Progress Notes (Addendum)
Recreation Therapy Notes  Date: 12.23.19 Time: 0930 Location: [address] Dayroom  Group Topic: Stress Management  Goal Area(s) Addresses:  Patient will verbalize importance of using healthy stress management.  Patient will identify positive emotions associated with healthy stress management.   Intervention: Stress Management  Activity :  Meditation.  LRT introduced the stress management technique of meditation.  LRT played a meditation that allowed patients to focus on the possibilities the day holds.  Patients were to follow along as the meditation played to engage in the activity.  Education:  Stress Management, Discharge Planning.   Education Outcome: Acknowledges edcuation/In group clarification offered/Needs additional education  Clinical Observations/Feedback: Pt did not attend group.     Caroll RancherMarjette Cabrini Ruggieri, LRT/CTRS         Lillia AbedLindsay, Abbeygail Igoe A 01/26/2018 10:57 AM

## 2018-01-26 NOTE — Progress Notes (Signed)
  [facility] :  Will you be returning to the same living situation after discharge:  Yes,  [facility] to [facility] At discharge, do you have transportation home?: Yes,  bus pass provided Do you have the ability to pay for your medications: Yes,  [facility] insurance  Release of information consent forms completed and submitted to medical records by CSW.   Patient to Follow up at: Follow-up Information    [facility]. Go on 02/02/2018.   Specialty:  Behavioral Health Why:  Your hospital follow up appointment is Monday, 02/02/18 at 8:00a. Please bring: photo ID, proof of insurance, social security card, and discharge paperwork from this hospitalization.  Contact information: [address] [place] [address] [phone]           Next level of care provider [facility] Link:no  Safety Planning and Suicide Prevention discussed: Yes,  SPE completed with pt; contact attempt x2 with pt's wife. SPI pamphlet and mobile crisis information provided   Have you used any form of tobacco in the last 30 days? (Cigarettes, Smokeless Tobacco, Cigars, and/or Pipes): No  Has patient been referred to the Quitline?: N/A patient is not a smoker  Patient has been referred for addiction treatment: Yes  Rona RavensHeather S Blanche Gallien, LCSW 01/26/2018, 8:38 AM

## 2018-02-24 ENCOUNTER — Emergency Department (HOSPITAL_COMMUNITY)
Admission: EM | Admit: 2018-02-24 | Discharge: 2018-02-25 | Disposition: A | Discharge status: Home / Self Care | Payer: 59 | Attending: Emergency Medicine | Admitting: Emergency Medicine

## 2018-02-24 ENCOUNTER — Other Ambulatory Visit: Payer: Self-pay

## 2018-02-24 ENCOUNTER — Encounter (HOSPITAL_COMMUNITY): Payer: Self-pay

## 2018-02-24 ENCOUNTER — Emergency Department (HOSPITAL_COMMUNITY): Payer: 59

## 2018-02-24 DIAGNOSIS — Z9884 Bariatric surgery status: Secondary | ICD-10-CM | POA: Insufficient documentation

## 2018-02-24 DIAGNOSIS — F1020 Alcohol dependence, uncomplicated: Secondary | ICD-10-CM | POA: Insufficient documentation

## 2018-02-24 DIAGNOSIS — R945 Abnormal results of liver function studies: Secondary | ICD-10-CM | POA: Diagnosis not present

## 2018-02-24 DIAGNOSIS — R45851 Suicidal ideations: Secondary | ICD-10-CM | POA: Diagnosis not present

## 2018-02-24 DIAGNOSIS — E669 Obesity, unspecified: Secondary | ICD-10-CM | POA: Diagnosis present

## 2018-02-24 DIAGNOSIS — I10 Essential (primary) hypertension: Secondary | ICD-10-CM | POA: Diagnosis present

## 2018-02-24 DIAGNOSIS — K7689 Other specified diseases of liver: Secondary | ICD-10-CM | POA: Diagnosis not present

## 2018-02-24 DIAGNOSIS — K7010 Alcoholic hepatitis without ascites: Secondary | ICD-10-CM | POA: Diagnosis not present

## 2018-02-24 DIAGNOSIS — Z9119 Patient's noncompliance with other medical treatment and regimen: Secondary | ICD-10-CM | POA: Insufficient documentation

## 2018-02-24 DIAGNOSIS — E66811 Obesity, class 1: Secondary | ICD-10-CM | POA: Diagnosis present

## 2018-02-24 DIAGNOSIS — F329 Major depressive disorder, single episode, unspecified: Secondary | ICD-10-CM | POA: Diagnosis not present

## 2018-02-24 DIAGNOSIS — Z008 Encounter for other general examination: Secondary | ICD-10-CM | POA: Diagnosis present

## 2018-02-24 DIAGNOSIS — M545 Low back pain: Secondary | ICD-10-CM | POA: Insufficient documentation

## 2018-02-24 DIAGNOSIS — Z79899 Other long term (current) drug therapy: Secondary | ICD-10-CM | POA: Insufficient documentation

## 2018-02-24 DIAGNOSIS — E871 Hypo-osmolality and hyponatremia: Secondary | ICD-10-CM | POA: Diagnosis present

## 2018-02-24 DIAGNOSIS — F332 Major depressive disorder, recurrent severe without psychotic features: Secondary | ICD-10-CM | POA: Diagnosis present

## 2018-02-24 LAB — ACETAMINOPHEN LEVEL: Acetaminophen (Tylenol), Serum: <10 ug/mL — ABNORMAL LOW (ref 10–30)

## 2018-02-24 LAB — COMPREHENSIVE METABOLIC PANEL
ALT: 57 U/L — ABNORMAL HIGH (ref 0–44)
AST: 205 U/L — ABNORMAL HIGH (ref 15–41)
Albumin: 4.5 g/dL (ref 3.5–5.0)
Alkaline Phosphatase: 131 U/L — ABNORMAL HIGH (ref 38–126)
Anion gap: 17 — ABNORMAL HIGH (ref 5–15)
BUN: 11 mg/dL (ref 6–20)
CO2: 21 mmol/L — ABNORMAL LOW (ref 22–32)
Calcium: 9.1 mg/dL (ref 8.9–10.3)
Chloride: 90 mmol/L — ABNORMAL LOW (ref 98–111)
Creatinine, Ser: 0.73 mg/dL (ref 0.61–1.24)
Glucose, Bld: 96 mg/dL (ref 70–99)
Potassium: 4.6 mmol/L (ref 3.5–5.1)
Sodium: 128 mmol/L — ABNORMAL LOW (ref 135–145)
TOTAL PROTEIN: 7 g/dL (ref 6.5–8.1)
Total Bilirubin: 2.1 mg/dL — ABNORMAL HIGH (ref 0.3–1.2)

## 2018-02-24 LAB — RAPID URINE DRUG SCREEN, HOSP PERFORMED
Amphetamines: NOT DETECTED
Barbiturates: NOT DETECTED
Benzodiazepines: NOT DETECTED
Cocaine: NOT DETECTED
Opiates: NOT DETECTED
Tetrahydrocannabinol: NOT DETECTED

## 2018-02-24 LAB — CBC
HCT: 40.9 % (ref 39.0–52.0)
Hemoglobin: 13.5 g/dL (ref 13.0–17.0)
MCH: 26.4 pg (ref 26.0–34.0)
MCHC: 33 g/dL (ref 30.0–36.0)
MCV: 79.9 fL — ABNORMAL LOW (ref 80.0–100.0)
Platelets: 231 10*3/uL (ref 150–400)
RBC: 5.12 MIL/uL (ref 4.22–5.81)
RDW: 14.6 % (ref 11.5–15.5)
WBC: 12.9 10*3/uL — ABNORMAL HIGH (ref 4.0–10.5)
nRBC: 0 % (ref 0.0–0.2)

## 2018-02-24 LAB — LIPASE, BLOOD: Lipase: 29 U/L (ref 11–51)

## 2018-02-24 LAB — SALICYLATE LEVEL: Salicylate Lvl: <7 mg/dL (ref 2.8–30.0)

## 2018-02-24 LAB — ETHANOL: Alcohol, Ethyl (B): 100 mg/dL — ABNORMAL HIGH (ref <10)

## 2018-02-24 MED ORDER — LORAZEPAM 1 MG PO TABS
1.0000 mg | ORAL_TABLET | Freq: Four times a day (QID) | ORAL | Status: DC | PRN
  Administered 2018-02-25: 1 mg via ORAL
  Filled 2018-02-24: qty 1

## 2018-02-24 MED ORDER — THIAMINE HCL 100 MG/ML IJ SOLN: 100.0000 mg | Freq: Every day | INTRAMUSCULAR | Status: DC

## 2018-02-24 MED ORDER — KETOROLAC TROMETHAMINE 30 MG/ML IJ SOLN
30.0000 mg | Freq: Once | INTRAMUSCULAR | Status: AC
  Administered 2018-02-24: 30 mg via INTRAVENOUS
  Filled 2018-02-24: qty 1

## 2018-02-24 MED ORDER — LACTATED RINGERS IV SOLN
INTRAVENOUS | Status: AC
  Administered 2018-02-25: via INTRAVENOUS

## 2018-02-24 MED ORDER — GABAPENTIN 100 MG PO CAPS
100.0000 mg | ORAL_CAPSULE | Freq: Three times a day (TID) | ORAL | Status: DC
  Administered 2018-02-24 – 2018-02-25 (×3): 100 mg via ORAL
  Filled 2018-02-24 (×3): qty 1

## 2018-02-24 MED ORDER — LACTATED RINGERS IV BOLUS
1000.0000 mL | Freq: Once | INTRAVENOUS | Status: AC
  Administered 2018-02-25: 1000 mL via INTRAVENOUS

## 2018-02-24 MED ORDER — FOLIC ACID 1 MG PO TABS
1.0000 mg | ORAL_TABLET | Freq: Every day | ORAL | Status: DC
  Administered 2018-02-24 – 2018-02-25 (×2): 1 mg via ORAL
  Filled 2018-02-24 (×2): qty 1

## 2018-02-24 MED ORDER — ADULT MULTIVITAMIN W/MINERALS CH
1.0000 | ORAL_TABLET | Freq: Every day | ORAL | Status: DC
  Administered 2018-02-24 – 2018-02-25 (×2): 1 via ORAL
  Filled 2018-02-24 (×2): qty 1

## 2018-02-24 MED ORDER — FLUOXETINE HCL 20 MG PO CAPS
40.0000 mg | ORAL_CAPSULE | Freq: Every day | ORAL | Status: DC
  Administered 2018-02-24 – 2018-02-25 (×2): 40 mg via ORAL
  Filled 2018-02-24 (×3): qty 2

## 2018-02-24 MED ORDER — LACTATED RINGERS IV BOLUS: 1000.0000 mL | Freq: Once | INTRAVENOUS | Status: DC

## 2018-02-24 MED ORDER — HYDROXYZINE HCL 25 MG PO TABS
50.0000 mg | ORAL_TABLET | Freq: Four times a day (QID) | ORAL | Status: DC | PRN
  Administered 2018-02-24 – 2018-02-25 (×2): 50 mg via ORAL
  Filled 2018-02-24 (×2): qty 2

## 2018-02-24 MED ORDER — IBUPROFEN 200 MG PO TABS
600.0000 mg | ORAL_TABLET | Freq: Once | ORAL | Status: AC
  Administered 2018-02-25: 600 mg via ORAL
  Filled 2018-02-24: qty 3

## 2018-02-24 MED ORDER — VITAMIN B-1 100 MG PO TABS
100.0000 mg | ORAL_TABLET | Freq: Every day | ORAL | Status: DC
  Administered 2018-02-24 – 2018-02-25 (×2): 100 mg via ORAL
  Filled 2018-02-24 (×2): qty 1

## 2018-02-24 MED ORDER — LORAZEPAM 2 MG/ML IJ SOLN: 1.0000 mg | Freq: Four times a day (QID) | INTRAMUSCULAR | Status: DC | PRN

## 2018-02-24 MED ORDER — ONDANSETRON 4 MG PO TBDP
4.0000 mg | ORAL_TABLET | Freq: Once | ORAL | Status: AC
  Administered 2018-02-24: 4 mg via ORAL
  Filled 2018-02-24: qty 1

## 2018-02-24 MED ORDER — LACTATED RINGERS IV SOLN: INTRAVENOUS | Status: DC

## 2018-02-24 NOTE — ED Provider Notes (Signed)
[facility]-EMERGENCY DEPT Provider Note   CSN: [id number] Arrival date & time: 02/24/18  1352     History   Chief Complaint Chief Complaint  Patient presents with  . Suicidal    HPI Derek [name] is a 58 y.o. male.  Patient presents to ED today for evaluation regarding suicidal thoughts. Patient has history of alcohol dependence and depression. He has not been compliant with his anti-depression medications. No increase in alcohol consumption. He reports that he has been feeling more depressed over the last 3 days. He states he is having suicidal thoughts, but does not have a plan. He is complaining of low back pain, as well as "feeling hot" with flushed skin. No chest pain, cough. No abdominal pain. Nauseated with occasional emesis.   The history is provided by the patient and medical records. No language interpreter was used.  Mental Health Problem  Presenting symptoms: suicidal thoughts   Degree of incapacity (severity):  Moderate Onset quality:  Gradual Duration:  3 days Timing:  Constant Progression:  Worsening Chronicity:  Recurrent Context: alcohol use   Treatment compliance:  Unable to specify Associated symptoms: fatigue   Associated symptoms: no abdominal pain and no chest pain   Risk factors: hx of mental illness     Past Medical History:  Diagnosis Date  . Alcohol abuse   . Anxiety   . Depression   . Essential hypertension 11/08/2015  . Homelessness   . Hypertension   . Obesity (BMI 30.0-34.9) 11/08/2015  . Sinus bradycardia 11/08/2015    Patient Active Problem List   Diagnosis Date Noted  . Severe recurrent major depression w/psychotic features, mood-congruent (HCC) 01/21/2018  . 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 Major depressive disorder, recurrent episode (HCC) 12/19/2016  . Alcohol use disorder, severe, dependence (HCC) 12/19/2016  . Essential hypertension 11/08/2015  . Obesity (BMI  30.0-34.9) 11/08/2015  . Sinus bradycardia 11/08/2015    Past Surgical History:  Procedure Laterality Date  . CHOLECYSTECTOMY    . GASTRIC BYPASS     2006 - done at [facility]  . ROTATOR CUFF REPAIR  2017  . ROUX-EN-Y PROCEDURE  2007  . SHOULDER ARTHROSCOPY WITH SUBACROMIAL DECOMPRESSION Right 04/17/2015   Procedure: SHOULDER ARTHROSCOPY ROTATOR CUFF DEBRIDEMENT WITH SUBACROMIAL DECOMPRESSION;  Surgeon: Jones BroomJustin Chandler, MD;  Location: [facility];  Service: Orthopedics;  Laterality: Right;  . TONSILLECTOMY    . wisdom teeth extractions          Home Medications    Prior to Admission medications   Medication Sig Start Date End Date Taking? Authorizing Provider  acamprosate (CAMPRAL) 333 MG tablet Take 2 tablets (666 mg total) by mouth 3 (three) times daily. For alcoholism 01/26/18  Yes Armandina StammerNwoko, Agnes I, NP  FLUoxetine (PROZAC) 40 MG capsule Take 1 capsule (40 mg total) by mouth daily. For depression 01/26/18  Yes Armandina StammerNwoko, Agnes I, NP  gabapentin (NEURONTIN) 100 MG capsule Take 1 capsule (100 mg total) by mouth 3 (three) times daily. For agitation 01/26/18  Yes Armandina StammerNwoko, Agnes I, NP  hydrOXYzine (ATARAX/VISTARIL) 50 MG tablet Take 1 tablet (50 mg total) by mouth every 6 (six) hours as needed for anxiety (anxiety/agitation). 01/26/18  Yes Armandina StammerNwoko, Agnes I, NP  losartan (COZAAR) 25 MG tablet Take 25-50 mg by mouth daily.   Yes [provider]  Multiple Vitamin (MULTIVITAMIN WITH MINERALS) TABS tablet Take 1 tablet by mouth daily. Vitamin supplement 01/27/18  Yes Sanjuana KavaNwoko, Agnes I, NP  traZODone (DESYREL) 50 MG tablet Take 1 tablet (50 mg total) by mouth at bedtime as needed for sleep. Patient not taking: Reported on 02/24/2018 01/26/18   Sanjuana KavaNwoko, Agnes I, NP    Family History Family History  Problem Relation Age of Onset  . Cancer Mother   . Peripheral Artery Disease Father   . Heart attack Maternal Grandfather     Social History Social History   Tobacco Use  . Smoking  status: Never Smoker  . Smokeless tobacco: Never Used  Substance Use Topics  . Alcohol use: Yes    Comment: one fifth per day  . Drug use: Not Currently     Allergies   Patient has no known allergies.   Review of Systems Review of Systems  Constitutional: Positive for fatigue.  Respiratory: Negative for shortness of breath.   Cardiovascular: Negative for chest pain.  Gastrointestinal: Negative for abdominal pain.  Genitourinary: Negative for difficulty urinating and frequency.  Musculoskeletal: Positive for back pain.  Psychiatric/Behavioral: Positive for suicidal ideas.  All other systems reviewed and are negative.    Physical Exam Updated Vital Signs BP (!) 151/92 (BP Location: Left Arm)   Pulse 82   Temp 98.4 F (36.9 C) (Oral)   Resp 14   Ht 6\' 1"  (1.854 m)   Wt 104.3 kg   SpO2 100%   BMI 30.34 kg/m   Physical Exam Vitals signs and nursing note reviewed.  Constitutional:      Appearance: Normal appearance. He is normal weight.  HENT:     Head: Atraumatic.     Mouth/Throat:     Mouth: Mucous membranes are moist.  Eyes:     Conjunctiva/sclera: Conjunctivae normal.  Neck:     Musculoskeletal: Normal range of motion.  Cardiovascular:     Rate and Rhythm: Normal rate and regular rhythm.  Pulmonary:     Effort: Pulmonary effort is normal.     Breath sounds: Normal breath sounds.  Abdominal:     Palpations: Abdomen is soft.  Musculoskeletal: Normal range of motion.  Skin:    General: Skin is warm and dry.  Neurological:     Mental Status: He is alert and oriented to person, place, and time.  Psychiatric:        Mood and Affect: Mood is depressed.        Speech: Speech normal.        Behavior: Behavior is cooperative.        Thought Content: Thought content includes suicidal ideation. Thought content does not include suicidal plan.      ED Treatments / Results  Labs (all labs ordered are listed, but only abnormal results are displayed) Labs  Reviewed  CBC - Abnormal; Notable for the following components:      Result Value   WBC 12.9 (*)    MCV 79.9 (*)    All other components within normal limits  COMPREHENSIVE METABOLIC PANEL  ETHANOL  SALICYLATE LEVEL  ACETAMINOPHEN LEVEL  RAPID URINE DRUG SCREEN, HOSP PERFORMED    EKG None  Radiology No results found.  Procedures Procedures (including critical care time)  Medications Ordered in ED Medications - No data to display   Initial Impression / Assessment and Plan / ED Course  I have reviewed the triage vital signs and the nursing notes.  Pertinent labs & imaging results that were available during my care of the patient were reviewed by me and considered in my medical decision making (see chart for details).  Patient presents with suicidal ideation, no plan. Non-compliant with anti-depressants. Denies increase in alcohol use/consumption. Patient noted to have increase in liver enzymes and new onset of hyponatremia. Normal lipase. Normal glucose. US reveals increased heterogeneity of liver. Will request admission to medicine.   Patient seen by hospitalist. They recommend placement into psych hold. They have placed orders for treatment of hyponatremia and additional diagnostics. They will follow-up with the patient.   TTS consult initiated.  Patient has been assessed by behavioral health. He meets criteria for inpatient treatment.   Patient in psych hold with CIWA. Reassessment of labs in the morning.  Patient signed out to Dr. Rhunette Croft at shift change.  Final Clinical Impressions(s) / ED Diagnoses   Final diagnoses:  Suicidal ideation  Hyponatremia    ED Discharge Orders    None       Felicie Morn, NP 02/25/18 [id number]    Wynetta Fines, MD 02/25/18 1501

## 2018-02-24 NOTE — ED Notes (Signed)
Bed: CB76 Expected date:  Expected time:  Means of arrival:  Comments: Hold for 41

## 2018-02-24 NOTE — Consult Note (Signed)
Medical Consultation   Derek [name]  ZOX:[id number]  DOB: 07-28-1960  DOA: 02/24/2018  PCP: Shirleen Schirmer, PA-C   Outpatient Specialists: [facility], psych    Requesting physician: PA Katrinka Blazing  Reason for consultation: Hyponatremia, elevated LFTs   History of Present Illness: Derek [name] is an 58 y.o. 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  In brief the patient reports that he chronically drinks about 1/5 of liquor a day.  He reports that he has been trying to go to AA however because he is homeless and he gets money from his wife he spends it on alcohol.  He was brought in today by his sponsor because he had had thoughts about hurting himself.  He denies any history of complicated withdrawals, seizures, DTs.  He reports a history of prior psychiatric hospitalization for suicidal ideation.  He reports compliance with his medications including his acamprosate.  He does report one episode of nonbilious nonbloody emesis but denies any diarrhea, abdominal pain, cough, congestion, rhinorrhea.  He does report fairly severe back pain in a bandlike area over his lumbar spine.  He denies any difficulty walking.  He denies any loss of control of bladder or bowel.  ED course: In the ED patient's vitals were fairly unremarkable.  He did have borderline tachycardia.  Labs are notable for a white blood cell count of 12.9.  Sodium of 128.  AST and ALT were moderately elevated.  Alkaline phosphatase was moderately elevated.  Bilirubin was 2.1.  Right upper quadrant ultrasound was negative.  Lipase was normal.  [facility] hospitalist was consulted for admission for mild hyponatremia and mildly elevated LFTs.  Patient has not seen psychiatry or behavioral  health..      Review of Systems:  ROS As per HPI otherwise 10 point review of systems negative.    Past Medical History: Past Medical History:  Diagnosis Date  . Alcohol abuse   . Anxiety   . Depression   . Essential hypertension 11/08/2015  . Homelessness   . Hypertension   . Obesity (BMI 30.0-34.9) 11/08/2015  . Sinus bradycardia 11/08/2015    Past Surgical History: Past Surgical History:  Procedure Laterality Date  . CHOLECYSTECTOMY    . GASTRIC BYPASS     2006 - done at [facility]  . ROTATOR CUFF REPAIR  2017  . ROUX-EN-Y PROCEDURE  2007  . SHOULDER ARTHROSCOPY WITH SUBACROMIAL DECOMPRESSION Right 04/17/2015   Procedure: SHOULDER ARTHROSCOPY ROTATOR CUFF DEBRIDEMENT WITH SUBACROMIAL DECOMPRESSION;  Surgeon: Jones Broom, MD;  Location: [facility];  Service: Orthopedics;  Laterality: Right;  . TONSILLECTOMY    . wisdom teeth extractions       Allergies:  No Known Allergies   Social History:  reports that he has never smoked. He has never used smokeless tobacco. He reports current alcohol use. He reports previous drug use.   Family History: Family History  Problem Relation Age of Onset  . Cancer Mother   . Peripheral Artery Disease Father   . Heart attack Maternal Grandfather       Physical Exam: Vitals:   02/24/18 1404 02/24/18 1411 02/24/18 1616  BP:  (!) 151/92 139/81  Pulse:  82 96  Resp:  14 20  Temp:  98.4 F (36.9 C) 99.1 F (37.3 C)  TempSrc:  Oral Oral  SpO2:  100% 98%  Weight: 104.3 kg 104.3 kg   Height: 6\' 1"  (1.854 m) 6\' 1"  (1.854 m)     Constitutional: Appearance,  Alert and awake, oriented x3, not in any acute distress. Eyes: Anicteric sclera ENMT: Dry mucous membranes, good dentition Neck: neck appears normal CVS: Regular rate and rhythm, no murmurs Respiratory:  clear to auscultation bilaterally, no wheezing, rales or rhonchi. Respiratory effort normal. No accessory muscle use.  Abdomen: Soft, nontender, no right  quadrant tenderness, no rebound or guarding, plus bowel sounds Musculoskeletal: : Lower extremity edema Neuro: Closely intact, moving all extremities Psych: judgement and insight appear poor, depressed mood and affect, mental status alert and oriented x4 Skin: no rashes over visible skin   Data reviewed:  I have personally reviewed following labs and imaging studies Labs:  CBC: Recent Labs  Lab 02/24/18 1439  WBC 12.9*  HGB 13.5  HCT 40.9  MCV 79.9*  PLT 231    Basic Metabolic Panel: Recent Labs  Lab 02/24/18 1439  NA 128*  K 4.6  CL 90*  CO2 21*  GLUCOSE 96  BUN 11  CREATININE 0.73  CALCIUM 9.1   GFR Estimated Creatinine Clearance: 126.1 mL/min (by C-G formula based on SCr of 0.73 mg/dL). Liver Function Tests: Recent Labs  Lab 02/24/18 1439  AST 205*  ALT 57*  ALKPHOS 131*  BILITOT 2.1*  PROT 7.0  ALBUMIN 4.5   Recent Labs  Lab 02/24/18 1439  LIPASE 29   No results for input(s): AMMONIA in the last 168 hours. Coagulation profile No results for input(s): INR, PROTIME in the last 168 hours.  Cardiac Enzymes: No results for input(s): CKTOTAL, CKMB, CKMBINDEX, TROPONINI in the last 168 hours. BNP: Invalid input(s): POCBNP CBG: No results for input(s): GLUCAP in the last 168 hours. D-Dimer No results for input(s): DDIMER in the last 72 hours. Hgb A1c No results for input(s): HGBA1C in the last 72 hours. Lipid Profile No results for input(s): CHOL, HDL, LDLCALC, TRIG, CHOLHDL, LDLDIRECT in the last 72 hours. Thyroid function studies No results for input(s): TSH, T4TOTAL, T3FREE, THYROIDAB in the last 72 hours.  Invalid input(s): FREET3 Anemia work up No results for input(s): VITAMINB12, FOLATE, FERRITIN, TIBC, IRON, RETICCTPCT in the last 72 hours. Urinalysis    Component Value Date/Time   COLORURINE COLORLESS (A) 11/12/2017 1250   APPEARANCEUR CLEAR 11/12/2017 1250   LABSPEC 1.002 (L) 11/12/2017 1250   PHURINE 6.0 11/12/2017 1250   GLUCOSEU  NEGATIVE 11/12/2017 1250   HGBUR NEGATIVE 11/12/2017 1250   BILIRUBINUR NEGATIVE 11/12/2017 1250   KETONESUR NEGATIVE 11/12/2017 1250   PROTEINUR NEGATIVE 11/12/2017 1250   UROBILINOGEN 1.0 04/03/2007 1048   NITRITE NEGATIVE 11/12/2017 1250   LEUKOCYTESUR NEGATIVE 11/12/2017 1250     Sepsis Labs Invalid input(s): PROCALCITONIN,  WBC,  LACTICIDVEN Microbiology No results found for this or any previous visit (from the past 240 hour(s)).     Inpatient Medications:   Scheduled Meds: . folic acid  1 mg Oral Daily  . multivitamin with minerals  1 tablet Oral Daily  . thiamine  100 mg Oral Daily   Or  . thiamine  100 mg Intravenous Daily   Continuous Infusions: . lactated ringers    . lactated ringers       Radiological Exams on Admission: Koreas Abdomen Limited  Result Date: 02/24/2018 CLINICAL DATA:  Elevated LFTs EXAM: ULTRASOUND ABDOMEN LIMITED RIGHT UPPER  QUADRANT COMPARISON:  None. FINDINGS: Gallbladder: Surgically removed Common bile duct: Diameter: 3.4 mm. Liver: Heterogeneity is noted without focal mass. No significant nodularity is noted. Portal vein is patent on color Doppler imaging with normal direction of blood flow towards the liver. IMPRESSION: Mildly heterogeneous liver without focal mass. This could represent underlying hepatocellular disease or fatty infiltration. Status post cholecystectomy. Electronically Signed   By: Alcide CleverMark  Lukens M.D.   On: 02/24/2018 16:51    Impression/Recommendations Active Problems:   Essential hypertension   Obesity (BMI 30.0-34.9)   Alcohol use disorder, severe, dependence (HCC)   Severe recurrent major depression without psychotic features (HCC)   Hyponatremia  #) Hyponatremia: At this time suspect most likely hypovolemic with a combination of possible beer portal mania.  He is not clinically symptomatic from this.  He does appear to need IV hydration. -IV lactated Ringer's bolus -IV lactated Ringer's for 1 day -We will recheck  tomorrow  #) Acute alcoholic hepatitis/elevated LFTs: His LFT elevation pattern is quite consistent with acute alcoholic hepatitis.  With a bilirubin of 2.1 have low suspicion for patient needing steroids however will check INR to check patient's Modry score -We will order INR -We will calculate Maddrey score -Repeat LFTs tomorrow  #) Alcohol abuse: Patient has been actively drinking.  He apparently was taking acamprosate at home but he did have a setback. -CIWA protocol -Thiamine and folate supplementation -Counseling provided -Hold acamprosate at this time  #) Back pain: No signs or symptoms of cord compression.  Suspect most likely musculoskeletal in origin. -Symptomatic management  #) Suicidal ideation: Patient reports suicidal ideation but denies any plan.  Patient does not have any access to guns.  Patient has had prior hospitalizations for psychiatric suicidal ideations. -Psychiatry consult for placement  #) Depression/anxiety: - Continue fluoxetine 40 mg daily  #) Hypertension: Patient is currently not hypertensive -We will hold on losartan  #) Pain: Patient has neuropathic pain from his lower extremity neuropathy likely secondary to alcoholism -Restart gabapentin milligrams 3 times daily  Fluids: Per above Electrolytes: Monitor and supplement Nutrition: Regular diet next   Thank you for this consultation.  Our [facility] hospitalist team will follow the patient with you.   Time Spent: 25  Delaine LameShrey C Rome Echavarria M.D. [facility] 02/24/2018, 5:19 PM

## 2018-02-24 NOTE — ED Notes (Signed)
Patient moved from acute unit to bed 16 in main ED.

## 2018-02-24 NOTE — ED Notes (Signed)
Bed: QG92 Expected date:  Expected time:  Means of arrival:  Comments: Hold for 16

## 2018-02-24 NOTE — ED Notes (Signed)
Patient having pain in his lower back bilaterally.  Patient is grimacing and hyperventilating.  His face is flushed and he now has a temp of 99.1.  Ultrasound tech at bedside.

## 2018-02-24 NOTE — ED Notes (Signed)
Patient ambulatory to restroom without assistance at this time. 

## 2018-02-24 NOTE — ED Notes (Signed)
Patient has been dressed out in [employer] and personal belongings have been bagged and tagged.  Security has wanded patient before moving to [employer].

## 2018-02-24 NOTE — BH Assessment (Addendum)
Assessment Note  Derek [name] is an 58 y.o. male presenting with SI with no plan and requesting alcohol detox. Patient reported onset of SI was 3 days ago. Patient reported he was brought to ED by his sponsor because he shared thoughts of harming himself. Patient reported triggers as being his alcoholism, being homeless and separated from his wife and 2 children (18 and 79 years old). Patient reported chronically drinking 1/5 of liquor a day.  Patient reported that he has been trying to go to AA however because he is homeless and he gets money from his wife he spends it on alcohol.  Patient reported seeing Dr. Lafayette Dragon for medication management, diagnosis depression and anxiety. Patient reported compliance in taking medications, however feels they are not working. Patient was last inpatient at [facility] on 01/21/18 and 12/19/16 for SI and alcohol dependence. Patient also reported having gastric bypass surgery, food was his addiction and now since surgery alcohol has replaced his food addiction. Patient reported past weight was 400 lbs. Patient denied history of self harming behaviors and suicide attempt. Patient reported depressive symptoms including hopelessness, isolating, fatigue, guilt and loss of interest. Patient reported sleep and appetite normal. Patient denied HI and psychosis.   Patient cooperative during assessment. Eventhough patient was awakened for sleep to complete assessment patient was alert and oriented x4. Patient spoke logically with goal of inpatient treatment. Patient displayed minimal anxiety and requesting inpatient treatment. Judgement is unimpaired. Patient thought processes was are coherent and relevant. Patient mood and affect was depressed and sad. Patient demonstrated fair eye contact.   UDS negative BAL 100  [name], [place], patientmeets inpatient criteria. Patient not medically cleared, pending INR labs, scheduled for 5am. TTS to secure placement. [name], RN, informed of  disposition.   Diagnosis: Major depressive disorder and alcohol dependence  Past Medical History:  Past Medical History:  Diagnosis Date  . Alcohol abuse   . Anxiety   . Depression   . Essential hypertension 11/08/2015  . Homelessness   . Hypertension   . Obesity (BMI 30.0-34.9) 11/08/2015  . Sinus bradycardia 11/08/2015    Past Surgical History:  Procedure Laterality Date  . CHOLECYSTECTOMY    . GASTRIC BYPASS     2006 - done at [facility]  . ROTATOR CUFF REPAIR  2017  . ROUX-EN-Y PROCEDURE  2007  . SHOULDER ARTHROSCOPY WITH SUBACROMIAL DECOMPRESSION Right 04/17/2015   Procedure: SHOULDER ARTHROSCOPY ROTATOR CUFF DEBRIDEMENT WITH SUBACROMIAL DECOMPRESSION;  Surgeon: Jones Broom, MD;  Location: [facility];  Service: Orthopedics;  Laterality: Right;  . TONSILLECTOMY    . wisdom teeth extractions      Family History:  Family History  Problem Relation Age of Onset  . Cancer Mother   . Peripheral Artery Disease Father   . Heart attack Maternal Grandfather     Social History:  reports that he has never smoked. He has never used smokeless tobacco. He reports current alcohol use. He reports previous drug use.  Additional Social History:  Alcohol / Drug Use Pain Medications: see MAR Prescriptions: see MAR Over the Counter: see MAR  CIWA: CIWA-Ar BP: 137/78 Pulse Rate: 83 Nausea and Vomiting: no nausea and no vomiting Tactile Disturbances: moderate itching, pins and needles, burning or numbness Tremor: no tremor Auditory Disturbances: not present Paroxysmal Sweats: no sweat visible Visual Disturbances: not present Anxiety: three Headache, Fullness in Head: none present Agitation: normal activity Orientation and Clouding of Sensorium: oriented and can do serial additions CIWA-Ar Total: 6 COWS:  Allergies: No Known Allergies  Home Medications: (Not in a hospital admission)   OB/GYN Status:  No LMP for male patient.  General Assessment Data Location  of Assessment: [facility] ED TTS Assessment: In system Is this a Tele or Face-to-Face Assessment?: Face-to-Face Is this an Initial Assessment or a Re-assessment for this encounter?: Initial Assessment Patient Accompanied by:: N/A Language Other than English: No Living Arrangements: Homeless/Shelter What gender do you identify as?: Male Marital status: Married Pregnancy Status: (n/a) Living Arrangements: Alone Can pt return to current living arrangement?: Yes Admission Status: Voluntary Is patient capable of signing voluntary admission?: Yes Referral Source: Self/Family/Friend     Crisis Care Plan Living Arrangements: Alone Legal Guardian: (self) Name of Psychiatrist: (Dr. Lafayette Dragon at [facility]) Name of Therapist: (none)  Education Status Is patient currently in school?: No Is the patient employed, unemployed or receiving disability?: Unemployed  Risk to self with the past 6 months Suicidal Ideation: Yes-Currently Present Has patient been a risk to self within the past 6 months prior to admission? : Yes Suicidal Intent: No Has patient had any suicidal intent within the past 6 months prior to admission? : No Is patient at risk for suicide?: Yes Suicidal Plan?: No Has patient had any suicidal plan within the past 6 months prior to admission? : No Access to Means: No What has been your use of drugs/alcohol within the last 12 months?: (1/5th liquor daily) Previous Attempts/Gestures: No How many times?: (0) Other Self Harm Risks: (0) Triggers for Past Attempts: Family contact(seperated from family) Intentional Self Injurious Behavior: None Family Suicide History: No Recent stressful life event(s): Financial Problems(alcoholism, seperation from family) Persecutory voices/beliefs?: No Depression: Yes Depression Symptoms: Fatigue, Guilt, Loss of interest in usual pleasures, Feeling worthless/self pity Substance abuse history and/or treatment for substance abuse?: Yes  Risk to  Others within the past 6 months Homicidal Ideation: No Does patient have any lifetime risk of violence toward others beyond the six months prior to admission? : No Thoughts of Harm to Others: No Current Homicidal Intent: No Current Homicidal Plan: No Access to Homicidal Means: No History of harm to others?: No Assessment of Violence: None Noted Violent Behavior Description: (0) Does patient have access to weapons?: No Criminal Charges Pending?: No Does patient have a court date: No Is patient on probation?: No  Psychosis Hallucinations: None noted Delusions: None noted  Mental Status Report Appearance/Hygiene: Unremarkable Eye Contact: Fair Motor Activity: Freedom of movement Speech: Logical/coherent Level of Consciousness: Alert Mood: Depressed, Sad, Helpless Affect: Depressed, Sad Anxiety Level: Minimal Thought Processes: Coherent, Relevant Judgement: Unimpaired Orientation: Person, Place, Time, Situation Obsessive Compulsive Thoughts/Behaviors: None  Cognitive Functioning Concentration: Fair Memory: Recent Intact Is patient IDD: No Insight: Fair Impulse Control: Fair Appetite: Fair Have you had any weight changes? : No Change Sleep: No Change Total Hours of Sleep: (6-8) Vegetative Symptoms: None  ADLScreening [facility] Assessment Services) Patient's cognitive ability adequate to safely complete daily activities?: Yes Patient able to express need for assistance with ADLs?: Yes Independently performs ADLs?: Yes (appropriate for developmental age)  Prior Inpatient Therapy Prior Inpatient Therapy: Yes Prior Therapy Dates: (01/21/18 and 12/19/16) Prior Therapy Facilty/Provider(s): ([facility]) Reason for Treatment: (SI and alcohol dependence)  Prior Outpatient Therapy Prior Outpatient Therapy: No Does patient have an ACCT team?: No Does patient have Intensive In-House Services?  : No Does patient have [facility] services? : No Does patient have P4CC services?:  No  ADL Screening (condition at time of admission) Patient's cognitive ability adequate to  safely complete daily activities?: Yes Is the patient deaf or have difficulty hearing?: Yes(both ears ring) Does the patient have difficulty seeing, even when wearing glasses/contacts?: No Does the patient have difficulty concentrating, remembering, or making decisions?: No Patient able to express need for assistance with ADLs?: Yes Does the patient have difficulty dressing or bathing?: No Independently performs ADLs?: Yes (appropriate for developmental age) Does the patient have difficulty walking or climbing stairs?: No Weakness of Legs: Both Weakness of Arms/Hands: Both  Home Assistive Devices/Equipment Home Assistive Devices/Equipment: Eyeglasses  Therapy Consults (therapy consults require a physician order) PT Evaluation Needed: No OT Evalulation Needed: No SLP Evaluation Needed: No Abuse/Neglect Assessment (Assessment to be complete while patient is alone) Abuse/Neglect Assessment Can Be Completed: Yes Physical Abuse: Denies Verbal Abuse: Denies Sexual Abuse: Denies Exploitation of patient/patient's resources: Denies Self-Neglect: Denies Values / Beliefs Cultural Requests During Hospitalization: None Spiritual Requests During Hospitalization: None Consults Spiritual Care Consult Needed: No Social Work Consult Needed: Yes (Comment) Merchant navy officerAdvance Directives (For Healthcare) Does Patient Have a Medical Advance Directive?: No Would patient like information on creating a medical advance directive?: No - Patient declined Nutrition Screen- [facility] Adult/[facility] Patient's home diet: Regular Has the patient recently lost weight without trying?: No Has the patient been eating poorly because of a decreased appetite?: No Malnutrition Screening Tool Score: 0    Disposition:  Disposition Initial Assessment Completed for this Encounter: Yes  Donell SievertSimon Spencer, PA, patientmeets inpatient criteria. Patient  not medically cleared, pending INR labs, scheduled for 5am. TTS to secure placement. Fannie KneeSue, RN, informed of disposition.   On Site Evaluation by:   Reviewed with Physician:    Burnetta SabinLatisha D Jamyah Folk, [facility] 02/24/2018 9:50 PM

## 2018-02-24 NOTE — ED Notes (Addendum)
Derek Sievert, PA, patient meets inpatient criteria. Patient not medically cleared, pending INR labs, scheduled for 5am. TTS to secure placement. [name], RN, informed of disposition.

## 2018-02-24 NOTE — ED Notes (Signed)
Lipase added to existing blood in lab.

## 2018-02-24 NOTE — ED Triage Notes (Addendum)
Patient is AOx4 and ambulatory. Patient arrived via POV by friend. Patient chief complaint is Suicidal Ideation. Patient is homeless, last ETOH consumed was earlier this morning. Patient has IPAD and cellphone at bedside and is also complaining of stomach pain while drinking mountain dew.

## 2018-02-24 NOTE — ED Notes (Signed)
Moving to room 39 due to bed in TCU being needed for another patient. He was told of move and is accepting of the change. He has been very pleasant and cooperative. He has a mild fever of 100. Dr Rodena MedinMessick called for a prn order.

## 2018-02-24 NOTE — ED Notes (Signed)
Bed: [facility] Expected date:  Expected time:  Means of arrival:  Comments: Triage 1

## 2018-02-24 NOTE — ED Notes (Signed)
Patient alert, oriented, and cooperative.  Reports he is from [facility] where he lives with his wife and two kids.  He has been admitted to behavioral health before, but cannot remember if he has a diagnosis.  Patient reports he had a gastric bypass at some point and drinking now is his addiction instead of food.  He reports he used to weigh 400 lbs. Patient said he is having suicidal thoughts but when asked if he had a plan he wouldn't answer.

## 2018-02-25 DIAGNOSIS — E871 Hypo-osmolality and hyponatremia: Secondary | ICD-10-CM

## 2018-02-25 DIAGNOSIS — F1020 Alcohol dependence, uncomplicated: Secondary | ICD-10-CM

## 2018-02-25 LAB — HEPATIC FUNCTION PANEL
ALT: 55 U/L — ABNORMAL HIGH (ref 0–44)
AST: 177 U/L — ABNORMAL HIGH (ref 15–41)
Albumin: 3.9 g/dL (ref 3.5–5.0)
Alkaline Phosphatase: 118 U/L (ref 38–126)
Bilirubin, Direct: 0.5 mg/dL — ABNORMAL HIGH (ref 0.0–0.2)
Indirect Bilirubin: 1.9 mg/dL — ABNORMAL HIGH (ref 0.3–0.9)
Total Bilirubin: 2.4 mg/dL — ABNORMAL HIGH (ref 0.3–1.2)
Total Protein: 6.3 g/dL — ABNORMAL LOW (ref 6.5–8.1)

## 2018-02-25 LAB — CBC
HCT: 40.4 % (ref 39.0–52.0)
Hemoglobin: 13.3 g/dL (ref 13.0–17.0)
MCH: 26.2 pg (ref 26.0–34.0)
MCHC: 32.9 g/dL (ref 30.0–36.0)
MCV: 79.7 fL — ABNORMAL LOW (ref 80.0–100.0)
Platelets: 198 10*3/uL (ref 150–400)
RBC: 5.07 MIL/uL (ref 4.22–5.81)
RDW: 15.2 % (ref 11.5–15.5)
WBC: 7.3 10*3/uL (ref 4.0–10.5)
nRBC: 0 % (ref 0.0–0.2)

## 2018-02-25 LAB — BASIC METABOLIC PANEL
Anion gap: 10 (ref 5–15)
CO2: 27 mmol/L (ref 22–32)
Calcium: 9.3 mg/dL (ref 8.9–10.3)
Chloride: 99 mmol/L (ref 98–111)
Creatinine, Ser: 0.95 mg/dL (ref 0.61–1.24)
GFR calc Af Amer: >60 mL/min (ref >60)
GFR calc non Af Amer: >60 mL/min (ref >60)
Potassium: 3.7 mmol/L (ref 3.5–5.1)
Sodium: 136 mmol/L (ref 135–145)

## 2018-02-25 LAB — BASIC METABOLIC PANEL WITH GFR
BUN: 14 mg/dL (ref 6–20)
Glucose, Bld: 106 mg/dL — ABNORMAL HIGH (ref 70–99)

## 2018-02-25 LAB — PROTIME-INR
INR: 1.28
Prothrombin Time: 15.9 seconds — ABNORMAL HIGH (ref 11.4–15.2)

## 2018-02-25 LAB — MAGNESIUM: Magnesium: 2.2 mg/dL (ref 1.7–2.4)

## 2018-02-25 NOTE — Progress Notes (Signed)
PROGRESS NOTE    Derek ConchJames Lannom  ZOX:[id number] DOB: 1960-11-10 DOA: 02/24/2018 PCP: Shirleen Schirmerllis, Courtney, PA-C   Brief Narrative:  58 y.o. 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   Assessment & Plan:   Active Problems:   Essential hypertension   Obesity (BMI 30.0-34.9)   Alcohol use disorder, severe, dependence (HCC)   Severe recurrent major depression without psychotic features (HCC)   Hyponatremia   #) Hyponatremia: Resolved with IV hydration with lactated Ringer's and IV fluids.  Currently he is medically cleared as his lab abnormalities have resolved with some IV hydration.  We will be signing off -Continue regular diet by mouth  #) Acute alcoholic hepatitis/elevated LFTs: His LFTs have been stable or actually downtrending with abstinence.  He does not meet criteria for steroids.  He has no evidence of fevers or right upper quadrant pain at this time.  #) Alcohol abuse:  -CIWA protocol -Thiamine and folate supplementation -Counseling provided -Hold acamprosate at this time  #) Back pain: No signs or symptoms of cord compression.  Suspect most likely musculoskeletal in origin. -Symptomatic management  #) Suicidal ideation: Patient's last passive suicidal ideation. -Psychiatry consult for placement  #) Depression/anxiety: - Continue fluoxetine 40 mg daily  #) Hypertension: Patient is currently not hypertensive -We will hold on losartan  #) Pain: Patient has neuropathic pain from his lower extremity neuropathy likely secondary to alcoholism -Continue gabapentin milligrams 3 times daily   Thank you for this consultation.  [facility] team will be signing  off.     Subjective: This morning the patient appears to be more clear.  He continues to report passive suicidal ideation but denies any active plans.  He denies any nausea, vomiting, diarrhea.  He is tolerated his p.o. intake well.  Objective: Vitals:   02/24/18 2302 02/25/18 0408 02/25/18 0640 02/25/18 0826  BP: 136/73 (!) 154/84 123/79 140/76  Pulse: 64 67 63 84  Resp: 16 18 16 18   Temp: 100 F (37.8 C)  98.9 F (37.2 C) 98.7 F (37.1 C)  TempSrc: Oral  Oral Oral  SpO2: 96% 98% 97% 96%  Weight:      Height:        Intake/Output Summary (Last 24 hours) at 02/25/2018 1042 Last data filed at 02/25/2018 0835 Gross per 24 hour  Intake 2273.4 ml  Output -  Net 2273.4 ml   Filed Weights   02/24/18 1404 02/24/18 1411  Weight: 104.3 kg 104.3 kg    Examination:  General exam: Appears calm and comfortable  Respiratory system: Clear to auscultation. Respiratory effort normal. Cardiovascular system: Regular rate and rhythm, no murmurs Gastrointestinal system: Soft, nondistended, no rebound or guarding, plus bowel sounds Central nervous system: Alert and oriented.  Grossly intact, moving all extremities Extremities: No lower extremity edema Skin: No rashes over visible skin Psychiatry: Judgement and insight appear poor l. Mood & affect depressed    Data Reviewed: I have personally reviewed following labs and imaging studies  CBC: Recent Labs  Lab 02/24/18 1439 02/25/18 0407  WBC 12.9* 7.3  HGB 13.5 13.3  HCT 40.9 40.4  MCV 79.9* 79.7*  PLT 231 198   Basic Metabolic Panel: Recent Labs  Lab 02/24/18 1439 02/25/18 0407  NA 128* 136  K 4.6 3.7  CL 90* 99  CO2 21* 27  GLUCOSE 96 106*  BUN 11 14  CREATININE 0.73 0.95  CALCIUM 9.1 9.3  MG  --  2.2   GFR: Estimated Creatinine Clearance: 106.2 mL/min (by C-G formula based on SCr of 0.95 mg/dL). Liver Function Tests: Recent Labs  Lab 02/24/18 1439 02/25/18 0407  AST 205* 177*  ALT 57* 55*  ALKPHOS 131*  118  BILITOT 2.1* 2.4*  PROT 7.0 6.3*  ALBUMIN 4.5 3.9   Recent Labs  Lab 02/24/18 1439  LIPASE 29   No results for input(s): AMMONIA in the last 168 hours. Coagulation Profile: Recent Labs  Lab 02/24/18 2330  INR 1.28   Cardiac Enzymes: No results for input(s): CKTOTAL, CKMB, CKMBINDEX, TROPONINI in the last 168 hours. BNP (last 3 results) No results for input(s): PROBNP in the last 8760 hours. HbA1C: No results for input(s): HGBA1C in the last 72 hours. CBG: No results for input(s): GLUCAP in the last 168 hours. Lipid Profile: No results for input(s): CHOL, HDL, LDLCALC, TRIG, CHOLHDL, LDLDIRECT in the last 72 hours. Thyroid Function Tests: No results for input(s): TSH, T4TOTAL, FREET4, T3FREE, THYROIDAB in the last 72 hours. Anemia Panel: No results for input(s): VITAMINB12, FOLATE, FERRITIN, TIBC, IRON, RETICCTPCT in the last 72 hours. Sepsis Labs: No results for input(s): PROCALCITON, LATICACIDVEN in the last 168 hours.  No results found for this or any previous visit (from the past 240 hour(s)).       Radiology Studies: Koreas Abdomen Limited  Result Date: 02/24/2018 CLINICAL DATA:  Elevated LFTs EXAM: ULTRASOUND ABDOMEN LIMITED RIGHT UPPER QUADRANT COMPARISON:  None. FINDINGS: Gallbladder: Surgically removed Common bile duct: Diameter: 3.4 mm. Liver: Heterogeneity is noted without focal mass. No significant nodularity is noted. Portal vein is patent on color Doppler imaging with normal direction of blood flow towards the liver. IMPRESSION: Mildly heterogeneous liver without focal mass. This could represent underlying hepatocellular disease or fatty infiltration. Status post cholecystectomy. Electronically Signed   By: Alcide CleverMark  Lukens M.D.   On: 02/24/2018 16:51        Scheduled Meds: . FLUoxetine  40 mg Oral Daily  . folic acid  1 mg Oral Daily  . gabapentin  100 mg Oral TID  . multivitamin with minerals  1 tablet Oral Daily  . thiamine  100 mg Oral Daily   Or   . thiamine  100 mg Intravenous Daily   Continuous Infusions: . lactated ringers Stopped (02/25/18 0835)     LOS: 0 days    Time spent: 25    Delaine LameShrey C Susette Seminara, MD [facility]  If 7PM-7AM, please contact night-coverage [email or web address] Password [email or web address] 02/25/2018, 10:42 AM

## 2018-02-25 NOTE — ED Provider Notes (Signed)
  Physical Exam  BP 123/79 (BP Location: Left Arm)   Pulse 63   Temp 98.9 F (37.2 C) (Oral)   Resp 16   Ht 6\' 1"  (1.854 m)   Wt 104.3 kg   SpO2 97%   BMI 30.34 kg/m   Physical Exam  ED Course/Procedures     Procedures  MDM  58 year old patient came into the ED with chief complaint of SI. He is got history of alcohol abuse.  Sodium was 128, repeat sodium is normal.  At this time patient is medically cleared for psychiatric evaluation admission.   Derwood Kaplan, MD 02/25/18 [phone]

## 2018-02-25 NOTE — Progress Notes (Signed)
CSW aware of consult for transportation assistance. Per notes, patient is currently homeless and has recently been kicked out of the place he was staying. CSW attempted to provide patient a list of homeless shelters in the area but was informed by patient that he is able to return to [facility]. Patient confirmed he was kicked out of there last week but stated he spoke with someone from there who was willing to allow patient back. CSW inquired about if patient needed any substance use resources but patient declined stating he knew what to do. Only request patient had was a bus pass to get him back to the [employer]. Please reconsult if needs arise.  Archie Balboa, LCSWA  Clinical Social Work Department  [employer]  [phone]

## 2018-02-25 NOTE — ED Notes (Addendum)
Pt declining any suicidal thoughts since last contacted, stating that he is just really anxious right now being here. Sitter at bedside and PRN Ativan will be given for anxiety.

## 2018-02-25 NOTE — Patient Outreach (Signed)
CSW let [facility] know that the patient does not want to be seen by [facility] and plans to follow up with [facility] living [facility]. Patient recently relapsed on alcohol.

## 2018-02-25 NOTE — ED Notes (Signed)
Pt transferred from Main ED, cleared to return to Psych ED, presents with passive SI and feeling of hopelessness.  A&O x 3, no distress noted, calm & cooperative, resting at present.  Monitoring for safety, Q 15 min checks in effect.

## 2018-02-25 NOTE — Discharge Instructions (Signed)
For your behavioral health needs, you are advised to follow up with Milagros Evener, MD, per your previously scheduled appointment:       Milagros Evener, MD      [address]      [place], [place] [address]      [phone]

## 2018-02-25 NOTE — BH Assessment (Signed)
[facility] Assessment Progress Note  Per Juanetta Beets, DO, this pt does not require psychiatric hospitalization at this time.  Pt is to be discharged from [facility].  Pt reportedly has made arrangements for an intake appointment with Milagros Evener, MD on 04/14/2018.  Discharge instructions advise pt to follow through with this plan.  Pt's nurse, Joanie Coddington, has been notified.  Doylene Canning, MA Triage Specialist [phone]

## 2018-02-25 NOTE — Consult Note (Addendum)
[facility]  02/25/2018 12:49 PM Derek [name]  MRN:  [id number] Principal Problem: Alcohol use disorder, severe, dependence [facility](HCC) Discharge Diagnoses: Active Problems:   Essential hypertension   Obesity (BMI 30.0-34.9)   Alcohol use disorder, severe, dependence (HCC)   Severe recurrent major depression without psychotic features (HCC)   Hyponatremia   Subjective: Pt was seen and chart reviewed with treatment team and Dr Sharma CovertNorman. Pt denies suicidal/homicidal ideation, denies auditory/visual hallucinations and does not appear to be responding to internal stimuli. Pt stated he relapsed on alcohol Monday and feels hopeless. He stated he can return to [facility] where he has been staying and he is going to return to AA and he has a sponsor. His relapse was triggered by working for his sponsor and getting some money in his pocket. He is sad and depressed today but has no suicidal plan or intent. His UDS was negative and his BAL was 100 on admission. He is exhibiting no withdrawal symptoms. Pt is psychiatrically clear.   Total Time spent with patient: 30 minutes  Past Psychiatric History: As above  Past Medical History:  Past Medical History:  Diagnosis Date  . Alcohol abuse   . Anxiety   . Depression   . Essential hypertension 11/08/2015  . Homelessness   . Hypertension   . Obesity (BMI 30.0-34.9) 11/08/2015  . Sinus bradycardia 11/08/2015    Past Surgical History:  Procedure Laterality Date  . CHOLECYSTECTOMY    . GASTRIC BYPASS     2006 - done at [facility]  . ROTATOR CUFF REPAIR  2017  . ROUX-EN-Y PROCEDURE  2007  . SHOULDER ARTHROSCOPY WITH SUBACROMIAL DECOMPRESSION Right 04/17/2015   Procedure: SHOULDER ARTHROSCOPY ROTATOR CUFF DEBRIDEMENT WITH SUBACROMIAL DECOMPRESSION;  Surgeon: Jones BroomJustin Chandler, MD;  Location: [facility];  Service: Orthopedics;  Laterality: Right;  . TONSILLECTOMY    . wisdom teeth extractions     Family History:  Family History   Problem Relation Age of Onset  . Cancer Mother   . Peripheral Artery Disease Father   . Heart attack Maternal Grandfather    Family Psychiatric  History: None per chart review.  Social History:  Social History   Substance and Sexual Activity  Alcohol Use Yes   Comment: one fifth per day     Social History   Substance and Sexual Activity  Drug Use Not Currently    Social History   Socioeconomic History  . Marital status: Married    Spouse name: Not on file  . Number of children: Not on file  . Years of education: Not on file  . Highest education level: Not on file  Occupational History  . Not on file  Social Needs  . Financial resource strain: Not on file  . Food insecurity:    Worry: Not on file    Inability: Not on file  . Transportation needs:    Medical: Not on file    Non-medical: Not on file  Tobacco Use  . Smoking status: Never Smoker  . Smokeless tobacco: Never Used  Substance and Sexual Activity  . Alcohol use: Yes    Comment: one fifth per day  . Drug use: Not Currently  . Sexual activity: Not Currently  Lifestyle  . Physical activity:    Days per week: Not on file    Minutes per session: Not on file  . Stress: Not on file  Relationships  . Social connections:    Talks on phone: Not  on file    Gets together: Not on file    Attends religious service: Not on file    Active member of club or organization: Not on file    Attends meetings of clubs or organizations: Not on file    Relationship status: Not on file  Other Topics Concern  . Not on file  Social History Narrative  . Not on file    Has this patient used any form of tobacco in the last 30 days? (Cigarettes, Smokeless Tobacco, Cigars, and/or Pipes) Prescription not provided because: Patient does not use tobacco products.  Current Medications: Current Facility-Administered Medications  Medication Dose Route Frequency Provider Last Rate Last Dose  . FLUoxetine (PROZAC) capsule 40 mg  40  mg Oral Daily Purohit, Shrey C, MD   40 mg at 02/25/18 0950  . folic acid (FOLVITE) tablet 1 mg  1 mg Oral Daily Purohit, Shrey C, MD   1 mg at 02/25/18 0950  . gabapentin (NEURONTIN) capsule 100 mg  100 mg Oral TID Arlyn Leak C, MD   100 mg at 02/25/18 0950  . hydrOXYzine (ATARAX/VISTARIL) tablet 50 mg  50 mg Oral Q6H PRN Purohit, Salli Quarry, MD   50 mg at 02/25/18 0949  . LORazepam (ATIVAN) tablet 1 mg  1 mg Oral Q6H PRN Purohit, Salli Quarry, MD   1 mg at 02/25/18 0417   Or  . LORazepam (ATIVAN) injection 1 mg  1 mg Intravenous Q6H PRN Purohit, Shrey C, MD      . multivitamin with minerals tablet 1 tablet  1 tablet Oral Daily Purohit, Salli Quarry, MD   1 tablet at 02/25/18 0950  . thiamine (VITAMIN B-1) tablet 100 mg  100 mg Oral Daily Purohit, Shrey C, MD   100 mg at 02/25/18 0950   Or  . thiamine (B-1) injection 100 mg  100 mg Intravenous Daily Purohit, Salli Quarry, MD       Current Outpatient Medications  Medication Sig Dispense Refill  . acamprosate (CAMPRAL) 333 MG tablet Take 2 tablets (666 mg total) by mouth 3 (three) times daily. For alcoholism 180 tablet 0  . FLUoxetine (PROZAC) 40 MG capsule Take 1 capsule (40 mg total) by mouth daily. For depression 30 capsule 0  . gabapentin (NEURONTIN) 100 MG capsule Take 1 capsule (100 mg total) by mouth 3 (three) times daily. For agitation 90 capsule 0  . hydrOXYzine (ATARAX/VISTARIL) 50 MG tablet Take 1 tablet (50 mg total) by mouth every 6 (six) hours as needed for anxiety (anxiety/agitation). 75 tablet 0  . losartan (COZAAR) 25 MG tablet Take 25-50 mg by mouth daily.    . Multiple Vitamin (MULTIVITAMIN WITH MINERALS) TABS tablet Take 1 tablet by mouth daily. Vitamin supplement    . traZODone (DESYREL) 50 MG tablet Take 1 tablet (50 mg total) by mouth at bedtime as needed for sleep. (Patient not taking: Reported on 02/24/2018) 30 tablet 0     Musculoskeletal: Strength & Muscle Tone: within normal limits Gait & Station: normal Patient leans:  N/A  Psychiatric Specialty Exam: Physical Exam  Nursing note and vitals reviewed. Constitutional: He is oriented to person, place, and time. He appears well-developed and well-nourished.  HENT:  Head: Normocephalic and atraumatic.  Neck: Normal range of motion.  Respiratory: Effort normal.  Musculoskeletal: Normal range of motion.  Neurological: He is alert and oriented to person, place, and time.  Psychiatric: His speech is normal and behavior is normal. Judgment and thought content normal. Cognition and memory  are normal. He exhibits a depressed mood.    Review of Systems  Psychiatric/Behavioral: Positive for depression and substance abuse.  All other systems reviewed and are negative.   Blood pressure (!) 123/98, pulse 84, temperature 98.4 F (36.9 C), temperature source Oral, resp. rate 18, height 6\' 1"  (1.854 m), weight 104.3 kg, SpO2 98 %.Body mass index is 30.34 kg/m.  General Appearance: Casual  Eye Contact:  Good  Speech:  Clear and Coherent and Normal Rate  Volume:  Normal  Mood:  Depressed  Affect:  Congruent and Depressed  Thought Process:  Coherent, Goal Directed, Linear and Descriptions of Associations: Intact  Orientation:  Full (Time, Place, and Person)  Thought Content:  Logical  Suicidal Thoughts:  No  Homicidal Thoughts:  No  Memory:  Immediate;   Good Recent;   Fair Remote;   Fair  Judgement:  Fair  Insight:  Fair  Psychomotor Activity:  Normal  Concentration:  Concentration: Good and Attention Span: Good  Recall:  Good  Fund of Knowledge:  Good  Language:  Good  Akathisia:  No  Handed:  Right  AIMS (if indicated):   N/A  Assets:  ArchitectCommunication Skills Financial Resources/Insurance Housing Social Support  ADL's:  Intact  Cognition:  WNL  Sleep:   N/A     Demographic Factors:  Male, Caucasian and Low socioeconomic status  Loss Factors: Financial problems/change in socioeconomic status  Historical Factors: NA  Risk Reduction Factors:    Sense of responsibility to family and Living with another person, especially a relative  Continued Clinical Symptoms:  Alcohol/Substance Abuse/Dependencies  Cognitive Features That Contribute To Risk:  Closed-mindedness    Suicide Risk:  Minimal: No identifiable suicidal ideation.  Patients presenting with no risk factors but with morbid ruminations; may be classified as minimal risk based on the severity of the depressive symptoms   Plan Of Care/Follow-up recommendations:  Activity:  as tolerated Diet:  Heart healthy  Disposition: and Treatment Plan:  Take all medications as prescribed by your outpatient provider. Keep all follow-up appointments as scheduled.  Do not consume alcohol or use illegal drugs while on prescription medications. Report any adverse effects from your medications to your primary care provider promptly.  In the event of recurrent symptoms or worsening symptoms, call 911, a crisis hotline, or go to the nearest emergency department for evaluation.   Laveda AbbeLaurie Britton Parks, NP 02/25/2018, 12:49 PM    Patient seen face-to-face for psychiatric evaluation, chart reviewed and case discussed with the physician extender and developed treatment plan. Reviewed the information documented and agree with the treatment plan.  Juanetta BeetsJacqueline Nawaal Alling, DO 02/25/18 1:14 PM

## 2018-04-08 ENCOUNTER — Encounter (HOSPITAL_COMMUNITY): Payer: Self-pay | Admitting: *Deleted

## 2018-04-08 ENCOUNTER — Emergency Department (HOSPITAL_COMMUNITY)
Admission: EM | Admit: 2018-04-08 | Discharge: 2018-04-09 | Disposition: A | Discharge status: Home / Self Care | Payer: 59 | Source: Home / Self Care | Attending: Emergency Medicine | Admitting: Emergency Medicine

## 2018-04-08 ENCOUNTER — Other Ambulatory Visit: Payer: Self-pay

## 2018-04-08 DIAGNOSIS — Y905 Blood alcohol level of 100-119 mg/100 ml: Secondary | ICD-10-CM | POA: Insufficient documentation

## 2018-04-08 DIAGNOSIS — F3289 Other specified depressive episodes: Secondary | ICD-10-CM | POA: Insufficient documentation

## 2018-04-08 DIAGNOSIS — F1024 Alcohol dependence with alcohol-induced mood disorder: Secondary | ICD-10-CM | POA: Insufficient documentation

## 2018-04-08 DIAGNOSIS — I10 Essential (primary) hypertension: Secondary | ICD-10-CM

## 2018-04-08 LAB — COMPREHENSIVE METABOLIC PANEL
ALK PHOS: 132 U/L — AB (ref 38–126)
ALT: 42 U/L (ref 0–44)
ANION GAP: 15 (ref 5–15)
AST: 93 U/L — ABNORMAL HIGH (ref 15–41)
Albumin: 4.1 g/dL (ref 3.5–5.0)
BUN: 13 mg/dL (ref 6–20)
CO2: 22 mmol/L (ref 22–32)
Calcium: 8.8 mg/dL — ABNORMAL LOW (ref 8.9–10.3)
Chloride: 95 mmol/L — ABNORMAL LOW (ref 98–111)
Creatinine, Ser: 0.94 mg/dL (ref 0.61–1.24)
GFR calc Af Amer: >60 mL/min (ref >60)
GFR calc non Af Amer: >60 mL/min (ref >60)
Glucose, Bld: 113 mg/dL — ABNORMAL HIGH (ref 70–99)
Potassium: 3.9 mmol/L (ref 3.5–5.1)
Sodium: 132 mmol/L — ABNORMAL LOW (ref 135–145)
Total Bilirubin: 1.3 mg/dL — ABNORMAL HIGH (ref 0.3–1.2)
Total Protein: 7 g/dL (ref 6.5–8.1)

## 2018-04-08 LAB — CBC
HCT: 41.3 % (ref 39.0–52.0)
Hemoglobin: 13.5 g/dL (ref 13.0–17.0)
MCH: 26.7 pg (ref 26.0–34.0)
MCHC: 32.7 g/dL (ref 30.0–36.0)
MCV: 81.6 fL (ref 80.0–100.0)
Platelets: 359 10*3/uL (ref 150–400)
RBC: 5.06 MIL/uL (ref 4.22–5.81)
RDW: 16.5 % — ABNORMAL HIGH (ref 11.5–15.5)
WBC: 11.2 10*3/uL — ABNORMAL HIGH (ref 4.0–10.5)
nRBC: 0 % (ref 0.0–0.2)

## 2018-04-08 LAB — LIPASE, BLOOD: Lipase: 35 U/L (ref 11–51)

## 2018-04-08 MED ORDER — SODIUM CHLORIDE 0.9 % IV BOLUS
1000.0000 mL | Freq: Once | INTRAVENOUS | Status: AC
  Administered 2018-04-09: 1000 mL via INTRAVENOUS

## 2018-04-08 MED ORDER — SODIUM CHLORIDE 0.9% FLUSH
3.0000 mL | Freq: Once | INTRAVENOUS | Status: AC
  Administered 2018-04-09: 3 mL via INTRAVENOUS

## 2018-04-08 MED ORDER — LORAZEPAM 2 MG/ML IJ SOLN
1.0000 mg | Freq: Once | INTRAMUSCULAR | Status: AC
  Administered 2018-04-09: 1 mg via INTRAVENOUS
  Filled 2018-04-08: qty 1

## 2018-04-08 NOTE — ED Notes (Signed)
Bed: [facility] Expected date:  Expected time:  Means of arrival:  Comments: 

## 2018-04-08 NOTE — ED Triage Notes (Signed)
Pt is requesting detox from [facility].  Last drink was about 15 minutes ago.  Found an open bottle of beer in triage room with pt.  There are couple in his back pack as well.  Pt denies SI but states "I don't care if I live or die."  He has been sleeping outside for the last 5 days.  He reports abd pain with n/v.

## 2018-04-08 NOTE — ED Provider Notes (Signed)
[place] DEPT Provider Note   CSN: [id number] Arrival date & time: 04/08/18  2113    History   Chief Complaint Chief Complaint  Patient presents with  . Detox    HPI Derek [name] is a 58 y.o. male with a history of alcohol use disorder, homelessness, sinus bradycardia, HTN, depression, and anxiety who presents to the emergency department with a chief complaint of "I need help to stop drinking alcohol."  The patient reports that he has been drinking 1/5 of bourbon daily other than when he attempted to quit drinking 4 days ago and was able to stop drinking for 2 days.  He reports that about 7 years ago that he was able to stop drinking for approximately 3 years.  He denies a history of delirium tremens, seizures from withdrawal, or confusion.  He reports a history of inpatient treatment for rehab for alcohol use disorder.  His last drink was at 2100.  When asked about SI he states "I just don't care anymore."  He denies a specific plan.  No HI or auditory visual hallucinations.  He also reports associated lower abdominal pain for the last 2 days with associated nausea and vomiting.  He reports that he last vomited around the time he took his last drink.  He denies hematemesis, melena, hematochezia, chest pain, shortness of breath, fever, chills, hematuria, or constipation.  He reports he has been having loose stools for the last 24 hours.  He reports a history of gastric bypass and cholecystectomy. He reports that he has been sleeping outside for the last few days.     The history is provided by the patient. No language interpreter was used.    Past Medical History:  Diagnosis Date  . Alcohol abuse   . Anxiety   . Depression   . Essential hypertension 11/08/2015  . Homelessness   . Hypertension   . Obesity (BMI 30.0-34.9) 11/08/2015  . Sinus bradycardia 11/08/2015    Patient Active Problem List   Diagnosis Date Noted  . Hyponatremia 02/24/2018  .  Severe recurrent major depression w/psychotic features, mood-congruent ([place]) 01/21/2018  . Severe recurrent major depression without psychotic features ([place]) 12/20/2016  . Major depressive disorder, recurrent episode ([place]) 12/19/2016  . Alcohol use disorder, severe, dependence ([place]) 12/19/2016  . Essential hypertension 11/08/2015  . Obesity (BMI 30.0-34.9) 11/08/2015  . Sinus bradycardia 11/08/2015    Past Surgical History:  Procedure Laterality Date  . CHOLECYSTECTOMY    . GASTRIC BYPASS     2006 - done at [facility]  . ROTATOR CUFF REPAIR  2017  . ROUX-EN-Y PROCEDURE  2007  . SHOULDER ARTHROSCOPY WITH SUBACROMIAL DECOMPRESSION Right 04/17/2015   Procedure: SHOULDER ARTHROSCOPY ROTATOR CUFF DEBRIDEMENT WITH SUBACROMIAL DECOMPRESSION;  Surgeon: Tania Ade, MD;  Location: [facility];  Service: Orthopedics;  Laterality: Right;  . TONSILLECTOMY    . wisdom teeth extractions          Home Medications    Prior to Admission medications   Medication Sig Start Date End Date Taking? Authorizing Provider  acamprosate (CAMPRAL) 333 MG tablet Take 2 tablets (666 mg total) by mouth 3 (three) times daily. For alcoholism Patient not taking: Reported on 04/08/2018 01/26/18   Lindell Spar I, NP  FLUoxetine (PROZAC) 40 MG capsule Take 1 capsule (40 mg total) by mouth daily. For depression Patient not taking: Reported on 04/08/2018 01/26/18   Lindell Spar I, NP  gabapentin (NEURONTIN) 100 MG capsule Take 1 capsule (100  mg total) by mouth 3 (three) times daily. For agitation Patient not taking: Reported on 04/08/2018 01/26/18   Lindell Spar I, NP  hydrOXYzine (ATARAX/VISTARIL) 50 MG tablet Take 1 tablet (50 mg total) by mouth every 6 (six) hours as needed for anxiety (anxiety/agitation). Patient not taking: Reported on 04/08/2018 01/26/18   Lindell Spar I, NP  Multiple Vitamin (MULTIVITAMIN WITH MINERALS) TABS tablet Take 1 tablet by mouth daily. Vitamin supplement Patient not taking: Reported  on 04/08/2018 01/27/18   Lindell Spar I, NP  traZODone (DESYREL) 50 MG tablet Take 1 tablet (50 mg total) by mouth at bedtime as needed for sleep. Patient not taking: Reported on 02/24/2018 01/26/18   Encarnacion Slates, NP    Family History Family History  Problem Relation Age of Onset  . Cancer Mother   . Peripheral Artery Disease Father   . Heart attack Maternal Grandfather     Social History Social History   Tobacco Use  . Smoking status: Never Smoker  . Smokeless tobacco: Never Used  Substance Use Topics  . Alcohol use: Yes    Comment: one fifth per day  . Drug use: Not Currently     Allergies   Patient has no known allergies.   Review of Systems Review of Systems  Constitutional: Positive for chills. Negative for appetite change and fever.  HENT: Negative for congestion and sore throat.   Respiratory: Negative for shortness of breath.   Cardiovascular: Negative for chest pain.  Gastrointestinal: Positive for abdominal pain, diarrhea, nausea and vomiting. Negative for anal bleeding, blood in stool and constipation.  Genitourinary: Negative for dysuria and urgency.  Musculoskeletal: Negative for back pain, myalgias, neck pain and neck stiffness.  Skin: Negative for rash.  Allergic/Immunologic: Negative for immunocompromised state.  Neurological: Negative for dizziness, weakness, numbness and headaches.  Psychiatric/Behavioral: Negative for confusion.     Physical Exam Updated Vital Signs BP (!) 146/96   Pulse 88   Temp 98.4 F (36.9 C) (Oral)   Resp 17   SpO2 98%   Physical Exam Vitals signs and nursing note reviewed.  Constitutional:      Appearance: He is well-developed.     Comments: Strong odor of urine.   HENT:     Head: Normocephalic.  Eyes:     Conjunctiva/sclera: Conjunctivae normal.  Neck:     Musculoskeletal: Neck supple.  Cardiovascular:     Rate and Rhythm: Normal rate and regular rhythm.     Pulses: Normal pulses.     Heart sounds: Normal  heart sounds. No murmur. No friction rub. No gallop.   Pulmonary:     Effort: Pulmonary effort is normal. No respiratory distress.     Breath sounds: No stridor. No wheezing, rhonchi or rales.  Chest:     Chest wall: No tenderness.  Abdominal:     General: There is no distension.     Palpations: Abdomen is soft. There is no mass.     Tenderness: There is abdominal tenderness. There is right CVA tenderness and left CVA tenderness. There is no guarding or rebound.     Hernia: No hernia is present.     Comments: Diffusely tender to palpation throughout the abdomen.  Maximal tenderness in the suprapubic region.  No rebound or guarding.  Abdomen is soft, nondistended.  Skin:    General: Skin is warm and dry.  Neurological:     Mental Status: He is alert.  Psychiatric:        Mood and  Affect: Affect is blunt and flat.        Behavior: Behavior is slowed.        Thought Content: Thought content does not include homicidal ideation. Thought content does not include homicidal or suicidal plan.      ED Treatments / Results  Labs (all labs ordered are listed, but only abnormal results are displayed) Labs Reviewed  COMPREHENSIVE METABOLIC PANEL - Abnormal; Notable for the following components:      Result Value   Sodium 132 (*)    Chloride 95 (*)    Glucose, Bld 113 (*)    Calcium 8.8 (*)    AST 93 (*)    Alkaline Phosphatase 132 (*)    Total Bilirubin 1.3 (*)    All other components within normal limits  CBC - Abnormal; Notable for the following components:   WBC 11.2 (*)    RDW 16.5 (*)    All other components within normal limits  URINALYSIS, ROUTINE W REFLEX MICROSCOPIC - Abnormal; Notable for the following components:   Color, Urine STRAW (*)    Specific Gravity, Urine 1.003 (*)    All other components within normal limits  ETHANOL - Abnormal; Notable for the following components:   Alcohol, Ethyl (B) 115 (*)    All other components within normal limits  ACETAMINOPHEN LEVEL -  Abnormal; Notable for the following components:   Acetaminophen (Tylenol), Serum <10 (*)    All other components within normal limits  LIPASE, BLOOD  RAPID URINE DRUG SCREEN, HOSP PERFORMED  SALICYLATE LEVEL    EKG EKG Interpretation  Date/Time:  Thursday April 09 2018 02:25:54 EST Ventricular Rate:  74 PR Interval:    QRS Duration: 96 QT Interval:  380 QTC Calculation: 422 R Axis:   -60 Text Interpretation:  Sinus rhythm Left anterior fascicular block Abnormal R-wave progression, late transition Baseline wander in lead(s) V1 Confirmed by Orpah Greek [phone]) on 04/09/2018 3:33:37 AM   Radiology No results found.  Procedures Procedures (including critical care time)  Medications Ordered in ED Medications  sodium chloride flush (NS) 0.9 % injection 3 mL (has no administration in time range)  alum & mag hydroxide-simeth (MAALOX/MYLANTA) 200-200-20 MG/5ML suspension 30 mL (has no administration in time range)  ibuprofen (ADVIL,MOTRIN) tablet 600 mg (has no administration in time range)  LORazepam (ATIVAN) injection 0-4 mg (0 mg Intravenous Not Given 04/09/18 0645)    Or  LORazepam (ATIVAN) tablet 0-4 mg ( Oral See Alternative 04/09/18 0645)  LORazepam (ATIVAN) injection 0-4 mg (has no administration in time range)    Or  LORazepam (ATIVAN) tablet 0-4 mg (has no administration in time range)  thiamine (VITAMIN B-1) tablet 100 mg (has no administration in time range)    Or  thiamine (B-1) injection 100 mg (has no administration in time range)  LORazepam (ATIVAN) injection 1 mg (1 mg Intravenous Given 04/09/18 0230)  sodium chloride 0.9 % bolus 1,000 mL (1,000 mLs Intravenous New Bag/Given 04/09/18 0200)     Initial Impression / Assessment and Plan / ED Course  I have reviewed the triage vital signs and the nursing notes.  Pertinent labs & imaging results that were available during my care of the patient were reviewed by me and considered in my medical decision making (see  chart for details).        58 year old male with a history of alcohol use disorder, homelessness, sinus bradycardia, HTN, depression, and anxiety presenting requesting help with daily alcohol use.  No  history of seizures or complicated withdrawal.  On my exam, no signs or symptoms associated with withdrawal.  He also endorses passive SI, but no HI or auditory visual hallucinations.  He has been having vomiting and diffuse abdominal pain and was given Ativan due to history of prolonged QTC and fluids.  On reexamination, he reports his nausea and vomiting and abdominal pain has since resolved.  No hematochezia or melena concerning for PUD.  No constitutional symptoms.  Transaminases are elevated 2-1, likely secondary to alcohol use.  Alk phos and total bilirubin elevated, but this appears chronic and likely secondary to cirrhosis.  Ethanol level 115.  CIWA score is 1.  CIWA protocol has been placed.  Patient is voluntary at this time.  At this time, he has been medically cleared.  Low suspicion for diverticulitis, colitis, appendicitis, cholecystitis, or pyelonephritis.  TTS has been consulted who recommends inpatient admission.  Home medication orders have been placed.  The patient is hemodynamically stable and in no acute distress at the time of medical clearance.  Please see psychiatry and behavioral health team notes for further work-up and disposition.  Final Clinical Impressions(s) / ED Diagnoses   Final diagnoses:  Alcohol-induced depressive disorder with moderate or severe use disorder [facility])    ED Discharge Orders    None       Joanne Gavel, PA-C 04/09/18 [id number]    Orpah Greek, MD 04/09/18 [phone]

## 2018-04-09 ENCOUNTER — Encounter (HOSPITAL_COMMUNITY): Payer: Self-pay | Admitting: *Deleted

## 2018-04-09 ENCOUNTER — Other Ambulatory Visit: Payer: Self-pay

## 2018-04-09 ENCOUNTER — Inpatient Hospital Stay (HOSPITAL_COMMUNITY)
Admission: AD | Admit: 2018-04-09 | Discharge: 2018-04-13 | DRG: 885 | Disposition: A | Discharge status: Home / Self Care | Payer: 59 | Source: Intra-hospital | Attending: Psychiatry | Admitting: Psychiatry

## 2018-04-09 DIAGNOSIS — Z560 Unemployment, unspecified: Secondary | ICD-10-CM | POA: Diagnosis not present

## 2018-04-09 DIAGNOSIS — F332 Major depressive disorder, recurrent severe without psychotic features: Secondary | ICD-10-CM | POA: Diagnosis not present

## 2018-04-09 DIAGNOSIS — Z9049 Acquired absence of other specified parts of digestive tract: Secondary | ICD-10-CM | POA: Diagnosis not present

## 2018-04-09 DIAGNOSIS — F1020 Alcohol dependence, uncomplicated: Secondary | ICD-10-CM | POA: Diagnosis present

## 2018-04-09 DIAGNOSIS — G629 Polyneuropathy, unspecified: Secondary | ICD-10-CM | POA: Diagnosis present

## 2018-04-09 DIAGNOSIS — F4310 Post-traumatic stress disorder, unspecified: Secondary | ICD-10-CM | POA: Diagnosis present

## 2018-04-09 DIAGNOSIS — R45851 Suicidal ideations: Secondary | ICD-10-CM | POA: Diagnosis present

## 2018-04-09 DIAGNOSIS — I10 Essential (primary) hypertension: Secondary | ICD-10-CM | POA: Diagnosis present

## 2018-04-09 DIAGNOSIS — Z9884 Bariatric surgery status: Secondary | ICD-10-CM | POA: Diagnosis not present

## 2018-04-09 DIAGNOSIS — G8929 Other chronic pain: Secondary | ICD-10-CM | POA: Diagnosis present

## 2018-04-09 DIAGNOSIS — Z79899 Other long term (current) drug therapy: Secondary | ICD-10-CM

## 2018-04-09 DIAGNOSIS — Z79891 Long term (current) use of opiate analgesic: Secondary | ICD-10-CM

## 2018-04-09 DIAGNOSIS — R4587 Impulsiveness: Secondary | ICD-10-CM | POA: Diagnosis present

## 2018-04-09 DIAGNOSIS — F419 Anxiety disorder, unspecified: Secondary | ICD-10-CM | POA: Diagnosis present

## 2018-04-09 DIAGNOSIS — F322 Major depressive disorder, single episode, severe without psychotic features: Secondary | ICD-10-CM | POA: Diagnosis present

## 2018-04-09 DIAGNOSIS — Z590 Homelessness: Secondary | ICD-10-CM

## 2018-04-09 DIAGNOSIS — Z6831 Body mass index (BMI) 31.0-31.9, adult: Secondary | ICD-10-CM

## 2018-04-09 DIAGNOSIS — Z818 Family history of other mental and behavioral disorders: Secondary | ICD-10-CM | POA: Diagnosis not present

## 2018-04-09 DIAGNOSIS — E669 Obesity, unspecified: Secondary | ICD-10-CM | POA: Diagnosis present

## 2018-04-09 DIAGNOSIS — Y905 Blood alcohol level of 100-119 mg/100 ml: Secondary | ICD-10-CM | POA: Diagnosis present

## 2018-04-09 LAB — RAPID URINE DRUG SCREEN, HOSP PERFORMED
Amphetamines: NOT DETECTED
BARBITURATES: NOT DETECTED
Benzodiazepines: NOT DETECTED
Cocaine: NOT DETECTED
Opiates: NOT DETECTED
Tetrahydrocannabinol: NOT DETECTED

## 2018-04-09 LAB — ACETAMINOPHEN LEVEL: Acetaminophen (Tylenol), Serum: <10 ug/mL — ABNORMAL LOW (ref 10–30)

## 2018-04-09 LAB — URINALYSIS, ROUTINE W REFLEX MICROSCOPIC
Bilirubin Urine: NEGATIVE
Glucose, UA: NEGATIVE mg/dL
Hgb urine dipstick: NEGATIVE
Ketones, ur: NEGATIVE mg/dL
Leukocytes,Ua: NEGATIVE
Nitrite: NEGATIVE
Protein, ur: NEGATIVE mg/dL
Specific Gravity, Urine: 1.003 — ABNORMAL LOW (ref 1.005–1.030)
pH: 6 (ref 5.0–8.0)

## 2018-04-09 LAB — SALICYLATE LEVEL: Salicylate Lvl: <7 mg/dL (ref 2.8–30.0)

## 2018-04-09 LAB — ETHANOL: Alcohol, Ethyl (B): 115 mg/dL — ABNORMAL HIGH (ref <10)

## 2018-04-09 MED ORDER — IBUPROFEN 200 MG PO TABS: 600.0000 mg | ORAL_TABLET | Freq: Three times a day (TID) | ORAL | Status: DC | PRN

## 2018-04-09 MED ORDER — LORAZEPAM 1 MG PO TABS
1.0000 mg | ORAL_TABLET | Freq: Four times a day (QID) | ORAL | Status: AC
  Administered 2018-04-09 (×2): 1 mg via ORAL
  Filled 2018-04-09 (×3): qty 1

## 2018-04-09 MED ORDER — HYDROXYZINE HCL 25 MG PO TABS
25.0000 mg | ORAL_TABLET | Freq: Four times a day (QID) | ORAL | Status: AC | PRN
  Administered 2018-04-11: 25 mg via ORAL
  Filled 2018-04-09 (×3): qty 1

## 2018-04-09 MED ORDER — LORAZEPAM 1 MG PO TABS: 0.0000 mg | ORAL_TABLET | Freq: Two times a day (BID) | ORAL | Status: DC

## 2018-04-09 MED ORDER — ALUM & MAG HYDROXIDE-SIMETH 200-200-20 MG/5ML PO SUSP: 30.0000 mL | ORAL | Status: DC | PRN

## 2018-04-09 MED ORDER — GABAPENTIN 100 MG PO CAPS
100.0000 mg | ORAL_CAPSULE | Freq: Three times a day (TID) | ORAL | Status: DC
  Administered 2018-04-09 – 2018-04-10 (×3): 100 mg via ORAL
  Filled 2018-04-09 (×7): qty 1

## 2018-04-09 MED ORDER — ONDANSETRON 4 MG PO TBDP
4.0000 mg | ORAL_TABLET | Freq: Four times a day (QID) | ORAL | Status: AC | PRN
  Administered 2018-04-09 (×2): 4 mg via ORAL
  Filled 2018-04-09 (×2): qty 1

## 2018-04-09 MED ORDER — VITAMIN B-1 100 MG PO TABS
100.0000 mg | ORAL_TABLET | Freq: Every day | ORAL | Status: DC
  Administered 2018-04-09: 100 mg via ORAL
  Filled 2018-04-09: qty 1

## 2018-04-09 MED ORDER — LORAZEPAM 1 MG PO TABS
1.0000 mg | ORAL_TABLET | Freq: Two times a day (BID) | ORAL | Status: AC
  Administered 2018-04-11 (×2): 1 mg via ORAL
  Filled 2018-04-09 (×3): qty 1

## 2018-04-09 MED ORDER — CLONIDINE HCL 0.1 MG PO TABS
0.1000 mg | ORAL_TABLET | Freq: Two times a day (BID) | ORAL | Status: DC
  Administered 2018-04-09 – 2018-04-10 (×2): 0.1 mg via ORAL
  Filled 2018-04-09 (×4): qty 1

## 2018-04-09 MED ORDER — ALUM & MAG HYDROXIDE-SIMETH 200-200-20 MG/5ML PO SUSP: 30.0000 mL | Freq: Four times a day (QID) | ORAL | Status: DC | PRN

## 2018-04-09 MED ORDER — ACETAMINOPHEN 325 MG PO TABS: 650.0000 mg | ORAL_TABLET | Freq: Four times a day (QID) | ORAL | Status: DC | PRN

## 2018-04-09 MED ORDER — THIAMINE HCL 100 MG/ML IJ SOLN
100.0000 mg | Freq: Once | INTRAMUSCULAR | Status: AC
  Administered 2018-04-09: 100 mg via INTRAMUSCULAR
  Filled 2018-04-09: qty 2

## 2018-04-09 MED ORDER — LOPERAMIDE HCL 2 MG PO CAPS: 2.0000 mg | ORAL_CAPSULE | ORAL | Status: AC | PRN

## 2018-04-09 MED ORDER — LORAZEPAM 1 MG PO TABS
1.0000 mg | ORAL_TABLET | Freq: Three times a day (TID) | ORAL | Status: AC
  Administered 2018-04-10 (×3): 1 mg via ORAL
  Filled 2018-04-09 (×3): qty 1

## 2018-04-09 MED ORDER — LORAZEPAM 2 MG/ML IJ SOLN: 0.0000 mg | Freq: Two times a day (BID) | INTRAMUSCULAR | Status: DC

## 2018-04-09 MED ORDER — LORAZEPAM 1 MG PO TABS
1.0000 mg | ORAL_TABLET | Freq: Four times a day (QID) | ORAL | Status: AC | PRN
  Administered 2018-04-11: 1 mg via ORAL
  Filled 2018-04-09: qty 1

## 2018-04-09 MED ORDER — THIAMINE HCL 100 MG/ML IJ SOLN: 100.0000 mg | Freq: Every day | INTRAMUSCULAR | Status: DC

## 2018-04-09 MED ORDER — ADULT MULTIVITAMIN W/MINERALS CH
1.0000 | ORAL_TABLET | Freq: Every day | ORAL | Status: DC
  Administered 2018-04-09 – 2018-04-13 (×5): 1 via ORAL
  Filled 2018-04-09 (×8): qty 1

## 2018-04-09 MED ORDER — VITAMIN B-1 100 MG PO TABS
100.0000 mg | ORAL_TABLET | Freq: Every day | ORAL | Status: DC
  Administered 2018-04-10 – 2018-04-13 (×4): 100 mg via ORAL
  Filled 2018-04-09 (×6): qty 1

## 2018-04-09 MED ORDER — MAGNESIUM HYDROXIDE 400 MG/5ML PO SUSP: 30.0000 mL | Freq: Every day | ORAL | Status: DC | PRN

## 2018-04-09 MED ORDER — LORAZEPAM 2 MG/ML IJ SOLN
0.0000 mg | Freq: Four times a day (QID) | INTRAMUSCULAR | Status: DC
  Administered 2018-04-09: 2 mg via INTRAVENOUS
  Filled 2018-04-09: qty 1

## 2018-04-09 MED ORDER — LORAZEPAM 1 MG PO TABS
1.0000 mg | ORAL_TABLET | Freq: Every day | ORAL | Status: AC
  Administered 2018-04-12: 1 mg via ORAL
  Filled 2018-04-09: qty 1

## 2018-04-09 MED ORDER — DULOXETINE HCL 30 MG PO CPEP
30.0000 mg | ORAL_CAPSULE | Freq: Every day | ORAL | Status: DC
  Administered 2018-04-09 – 2018-04-13 (×5): 30 mg via ORAL
  Filled 2018-04-09 (×9): qty 1

## 2018-04-09 MED ORDER — LORAZEPAM 1 MG PO TABS: 0.0000 mg | ORAL_TABLET | Freq: Four times a day (QID) | ORAL | Status: DC

## 2018-04-09 MED ORDER — TRAZODONE HCL 50 MG PO TABS
50.0000 mg | ORAL_TABLET | Freq: Every evening | ORAL | Status: DC | PRN
  Administered 2018-04-10 – 2018-04-11 (×2): 50 mg via ORAL
  Filled 2018-04-09 (×3): qty 1

## 2018-04-09 NOTE — ED Notes (Addendum)
Report called to Clydie Braun, Charity fundraiser at [facility]. Patient has signed voluntary paperwork for admission to [facility]. Patient updated on POC. Patient to shower and change into paper scrubs prior to transfer, due to hx of homelessness and dirty clothes. [facility] called for patient transport to [facility].

## 2018-04-09 NOTE — Progress Notes (Signed)
Admission DAR Note: Pt is a 58 y/o male transferred to [facility] from [facility] requesting help with detox and depression. Presents fidgety / restless on initial contact. Chart reviewed and pt assessed. Rated his depression 8/10, anxiety 10/10 and hopelessness 3/10. Per pt "I've been drinking for 12 years now, I had a gastric bypass 13 years ago and started drinking a year after that". Reports "I do binge drink, I drink a 5th of Jimmy Bean daily and 0.5 -1 case of beer when I can afford it". Stated he a period of sobriety (3 years ago) but relapsed "then I lost everything". Reports he's currently homeless. Per pt "I was stayed at the [employer] half way house on [place] for some months but I got kicked out because I couldn't stay sober" so  "I've been sleeping behind the cinema on the hard concrete floor for about 4 days now". 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 States he's been "married for a little over 20 years, we have 2 beautiful boys and my wife put me out, told me to get myself together".  Skin assessment done and belongings searched per protocol. Items deemed contraband secured in locker 55. Skin clean, dry with one bruise noted on right buttocks "It's my wallet from sleeping on the concrete with it". Emotional support offered to pt. Encouraged pt to voice concerns, attend to ADLs and comply with current treatment regimen. Unit orientation done, routines discussed and care plan reviewed with pt; understanding verbalized. Q 15 minutes safety checks initiated. POC implemented for safety and mood stability.

## 2018-04-09 NOTE — ED Notes (Signed)
This RN, with the verbal permission of the patient, removed 3 [place] bottles of beer from the patient's backpack. With the witness of Security officer Lambert Keto present, this RN opened, dumped out the beer in the sink, and discarded the empty bottles.

## 2018-04-09 NOTE — BH Assessment (Addendum)
Tele Assessment Note   Patient Name: Derek [name] MRN: [id number] Referring Physician: Frederik Pear, PA-C Location of Patient: [facility] Location of Provider: Behavioral Health TTS Department  Derek [name] is a 58 y.o. male who came to [facility] requesting detox from EtOH. Pt shares he has not been actively suicidal, though he, at this point, does not care if he lives or if he dies. He states he has been drinking 1/5 of liquor daily for 12 years and that his last drink was just prior to him coming into the hospital. Pt denies any hx of suicide attempts, though he shares he has been hospitalized multiple times and that he has been to SA treatment multiple times. Pt denies HI, AVH, NSSIB, involvement in the legal system, and access to weapons, including guns. Pt shares he does not use any substance other than EtOH.  Pt is not currently seeing a therapist, though he has been seeing Dr. Evelene Croon, a psychiatrist, off-and-on for a while. Pt states he is not currently taking his medication due to financial difficulties, though he does have an appointment with Dr. Evelene Croon on Tuesday, April 14, 2018.  Pt is not working and is not on disability. He states that, due to his current pain from sleeping outside the last 4-5 days, his depression, and his difficulties with SA, he has been having having troubles caring for himself in the mornings. He shares he has no support from family/friends and that his greatest support, when they're in contact, is his AA sponsor.  Pt declined having someone clinician can contact for collateral. Pt states his sponsor has "fired him" due to his inability to get sober at this time, which he was kidding about, but he states his sponsor knows he's struggling; he declined having clinician contact his sponsor.  Pt is oriented x4. His recent and remote memory is intact. Pt was cooperative throughout the assessment. Pt's insight, judgement, and impulse control is impaired at this  time.   Diagnosis: F10.24, Alcohol-induced depressive disorder, With moderate or severe use disorder   Past Medical History:  Past Medical History:  Diagnosis Date  . Alcohol abuse   . Anxiety   . Depression   . Essential hypertension 11/08/2015  . Homelessness   . Hypertension   . Obesity (BMI 30.0-34.9) 11/08/2015  . Sinus bradycardia 11/08/2015    Past Surgical History:  Procedure Laterality Date  . CHOLECYSTECTOMY    . GASTRIC BYPASS     2006 - [facility] at [facility]  . ROTATOR CUFF REPAIR  2017  . ROUX-EN-Y PROCEDURE  2007  . SHOULDER ARTHROSCOPY WITH SUBACROMIAL DECOMPRESSION Right 04/17/2015   Procedure: SHOULDER ARTHROSCOPY ROTATOR CUFF DEBRIDEMENT WITH SUBACROMIAL DECOMPRESSION;  Surgeon: Jones Broom, MD;  Location: [facility];  Service: Orthopedics;  Laterality: Right;  . TONSILLECTOMY    . wisdom teeth extractions      Family History:  Family History  Problem Relation Age of Onset  . Cancer Mother   . Peripheral Artery Disease Father   . Heart attack Maternal Grandfather     Social History:  reports that he has never smoked. He has never used smokeless tobacco. He reports current alcohol use. He reports previous drug use.  Additional Social History:  Alcohol / Drug Use Pain Medications: Please see MAR Prescriptions: Please see MAR Over the Counter: Please see MAR History of alcohol / drug use?: Yes Longest period of sobriety (when/how long): Unknown Substance #1 Name of Substance 1: EtOH 1 - Age of  First Use: Unknown 1 - Amount (size/oz): 1/5 liquor 1 - Frequency: Daily 1 - Duration: Unknown 1 - Last Use / Amount: Tonight (just prior to coming in)  CIWA: CIWA-Ar BP: 140/82 Pulse Rate: 88 Nausea and Vomiting: no nausea and no vomiting Tactile Disturbances: none Tremor: two Auditory Disturbances: not present Paroxysmal Sweats: no sweat visible Visual Disturbances: not present Anxiety: mildly anxious Headache, Fullness in Head: very  mild Agitation: normal activity Orientation and Clouding of Sensorium: oriented and can do serial additions CIWA-Ar Total: 4 COWS:    Allergies: No Known Allergies  Home Medications: (Not in a hospital admission)   OB/GYN Status:  No LMP for male patient.  General Assessment Data Assessment unable to be completed: Yes Reason for not completing assessment: Called Tele-Assessment machine to complete BH Assessment; there was no answer Location of Assessment: [facility] TTS Assessment: In system Is this a Tele or Face-to-Face Assessment?: Tele Assessment Is this an Initial Assessment or a Re-assessment for this encounter?: Initial Assessment Patient Accompanied by:: N/A Language Other than English: No Living Arrangements: Homeless/Shelter What gender do you identify as?: Male Marital status: Married [place] name: Bady Pregnancy Status: No Living Arrangements: Alone Can pt return to current living arrangement?: Yes Admission Status: Voluntary Is patient capable of signing voluntary admission?: Yes Referral Source: Self/Family/Friend Insurance type: [employer]     Crisis Care Plan Living Arrangements: Alone Legal Guardian: (Self) Name of Psychiatrist: Dr. Evelene Croon - private practice(Appt scheduled for Tuesday, April 14, 2018) Name of Therapist: None  Education Status Is patient currently in school?: No Is the patient employed, unemployed or receiving disability?: Unemployed  Risk to self with the past 6 months Suicidal Ideation: Yes-Currently Present Has patient been a risk to self within the past 6 months prior to admission? : Yes Suicidal Intent: No Has patient had any suicidal intent within the past 6 months prior to admission? : No Is patient at risk for suicide?: No Suicidal Plan?: No Has patient had any suicidal plan within the past 6 months prior to admission? : No Access to Means: No What has been your use of drugs/alcohol within the last 12 months?: Pt drinks  1/5 liquor daily; does not care if he lives or dies Previous Attempts/Gestures: No How many times?: 0 Other Self Harm Risks: Pt does not care if he lives or if he dies Triggers for Past Attempts: Unpredictable Intentional Self Injurious Behavior: None Family Suicide History: Unknown Recent stressful life event(s): Other (Comment)(Ongoing SA) Persecutory voices/beliefs?: No Depression: Yes Depression Symptoms: Despondent, Fatigue, Guilt, Feeling worthless/self pity Substance abuse history and/or treatment for substance abuse?: Yes Suicide prevention information given to non-admitted patients: Not applicable  Risk to Others within the past 6 months Homicidal Ideation: No Does patient have any lifetime risk of violence toward others beyond the six months prior to admission? : No Thoughts of Harm to Others: No Current Homicidal Intent: No Current Homicidal Plan: No Access to Homicidal Means: No Identified Victim: None noted History of harm to others?: No Assessment of Violence: On admission Violent Behavior Description: None noted Does patient have access to weapons?: No(Pt denied access to weapons, including guns) Criminal Charges Pending?: No Does patient have a court date: No Is patient on probation?: No  Psychosis Hallucinations: None noted Delusions: None noted  Mental Status Report Appearance/Hygiene: Disheveled Eye Contact: Poor Motor Activity: Freedom of movement, Other (Comment)(Pt was hooked up to wires and lying on his hospital bed) Speech: Soft, Logical/coherent Level of Consciousness: Drowsy Mood:  Worthless, low self-esteem Affect: Appropriate to circumstance Anxiety Level: Minimal Thought Processes: Coherent, Relevant Judgement: Partial Orientation: Person, Place, Time, Situation Obsessive Compulsive Thoughts/Behaviors: None  Cognitive Functioning Concentration: Fair Memory: Recent Intact, Remote Intact Is patient IDD: No Insight: Fair Impulse Control:  Poor Appetite: Good Have you had any weight changes? : No Change Sleep: Decreased Total Hours of Sleep: 6 Vegetative Symptoms: None  ADLScreening [facility] Assessment Services) Patient's cognitive ability adequate to safely complete daily activities?: Yes Patient able to express need for assistance with ADLs?: Yes Independently performs ADLs?: Yes (appropriate for developmental age)  Prior Inpatient Therapy Prior Inpatient Therapy: Yes Prior Therapy Dates: Multiple; last was approx 6 months ago Prior Therapy Facilty/Provider(s): Last was [facility] Reason for Treatment: SI and EtOH dependance  Prior Outpatient Therapy Prior Outpatient Therapy: Yes Prior Therapy Dates: Unknown Prior Therapy Facilty/Provider(s): Unknown Reason for Treatment: Alcohol dependance Does patient have an ACCT team?: No Does patient have Intensive In-House Services?  : No Does patient have [facility] services? : No Does patient have P4CC services?: No  ADL Screening (condition at time of admission) Patient's cognitive ability adequate to safely complete daily activities?: Yes Is the patient deaf or have difficulty hearing?: No Does the patient have difficulty seeing, even when wearing glasses/contacts?: No Does the patient have difficulty concentrating, remembering, or making decisions?: No Patient able to express need for assistance with ADLs?: Yes Does the patient have difficulty dressing or bathing?: No Independently performs ADLs?: Yes (appropriate for developmental age) Does the patient have difficulty walking or climbing stairs?: No Weakness of Legs: None Weakness of Arms/Hands: None  Home Assistive Devices/Equipment Home Assistive Devices/Equipment: None  Therapy Consults (therapy consults require a physician order) PT Evaluation Needed: No OT Evalulation Needed: No SLP Evaluation Needed: No Abuse/Neglect Assessment (Assessment to be complete while patient is alone) Abuse/Neglect Assessment  Can Be Completed: Unable to assess, patient is non-responsive or altered mental status Values / Beliefs Cultural Requests During Hospitalization: None Spiritual Requests During Hospitalization: None Consults Spiritual Care Consult Needed: No Social Work Consult Needed: No Merchant navy officer (For Healthcare) Does Patient Have a Medical Advance Directive?: No Would patient like information on creating a medical advance directive?: No - Patient declined         Disposition: [name], PA, reviewed pt's chart and information and determined pt meets criteria for inpatient hospitalization. Pt has been accepted at [facility] and can arrive after 0800.   Room: 303-1 Accepting: [name], PA Attending: Dr. Jama Flavors, MD Call to Report: [phone]  This information was provided to [facility], RN, at [phone].  Disposition Initial Assessment Completed for this Encounter: Yes  This service was provided via telemedicine using a 2-way, interactive audio and video technology.  Names of all persons participating in this telemedicine service and their role in this encounter. Name: Derek [name] Role: Patient  Name: Duard Brady Role: Clinician    Ralph Dowdy 04/09/2018 5:40 AM

## 2018-04-09 NOTE — Tx Team (Signed)
Initial Treatment Plan 04/09/2018 3:43 PM [name] VHS:[id number]    PATIENT STRESSORS: Financial difficulties Marital or family conflict Occupational concerns Substance abuse   PATIENT STRENGTHS: Ability for insight Capable of independent living Communication skills Special hobby/interest Work skills   PATIENT IDENTIFIED PROBLEMS: Alteration in mood (Anxiety & Depression) "I can't stop drinking, I lost my job, I can't go back to my wife till I fix myself".  Substance Abuse "I've been drinking for 12 years now and I really need help to get my life back".  Alteration in sleep pattern "I have not been sleeping well because I'm homeless".                 DISCHARGE CRITERIA:  Improved stabilization in mood, thinking, and/or behavior Verbal commitment to aftercare and medication compliance Withdrawal symptoms are absent or subacute and managed without 24-hour nursing intervention  PRELIMINARY DISCHARGE PLAN: Outpatient therapy Placement in alternative living arrangements  PATIENT/FAMILY INVOLVEMENT: This treatment plan has been presented to and reviewed with the patient, Derek [name].  The patient have been given the opportunity to ask questions and make suggestions.  Sherryl Manges, RN 04/09/2018, 3:43 PM

## 2018-04-09 NOTE — BH Assessment (Signed)
B[facility] Assessment Progress Note  Per [name], PA, this pt requires psychiatric hospitalization at this time.  Per EPIC notes, pt has been assigned pt to [facility] Rm 303-1.  Pt has signed Voluntary Admission and Consent for Treatment, as well as Consent to Release Information.  Pt's nurse has been notified, and agrees to send original paperwork along with pt via [place], and to call report to [phone].  Doylene Canning, [place] [facility] Coordinator [phone]
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
## 2018-04-09 NOTE — B[facility] Group Notes (Deleted)
[facility] LCSW Group Therapy Note  Date/Time 04/09/2018 1:30 PM  Type of Therapy/Topic:  Group Therapy:  Feelings about Diagnosis  Participation Level:  Did Not Attend    Marian Sorrow, MSW Intern 04/09/2018 4:35 PM

## 2018-04-09 NOTE — ED Notes (Signed)
Patient showered and placed in maroon scrubs. [facility] on the way for patient admission to [facility]. Patient updated on POC. VSS

## 2018-04-09 NOTE — [facility] Group Notes (Signed)
[facility] LCSW Group Therapy Note  Date/Time  Type of Therapy/Topic:  Group Therapy:  Feelings about Diagnosis  Participation Level:  Active   Mood: Depressed  Description of Group:    This group will allow patients to explore their thoughts and feelings about diagnoses they have received. Patients will be guided to explore their level of understanding and acceptance of these diagnoses. Facilitator will encourage patients to process their thoughts and feelings about the reactions of others to their diagnosis, and will guide patients in identifying ways to discuss their diagnosis with significant others in their lives. This group will be process-oriented, with patients participating in exploration of their own experiences as well as giving and receiving support and challenge from other group members.   Therapeutic Goals: 1. Patient will demonstrate understanding of diagnosis as evidence by identifying two or more symptoms of the disorder:  2. Patient will be able to express two feelings regarding the diagnosis 3. Patient will demonstrate ability to communicate their needs through discussion and/or role plays 4.   Summary of Patient Progress: Edvardo attended the entire session. He stated that he feels a lot of shame and guilt around his diagnosis. He shared that he would like to be validated when he communicates his feelings about his diagnosis with his loved ones.   Therapeutic Modalities:   Cognitive Behavioral Therapy Brief Therapy Feelings Identification    Marian Sorrow, MSW Intern 04/09/2018 4:35 PM

## 2018-04-09 NOTE — Progress Notes (Signed)
[name], PA, determined pt meets inpatient criteria. Pt has been accepted at [facility] and should arrive after 0800 today 04/09/2018.  Room: 303-1 Accepting: [name], PA Attending: Dr. Jama Flavors, MD Call to Report: [phone]  This information was provided to [facility], RN, at [phone].
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
## 2018-04-10 ENCOUNTER — Encounter (HOSPITAL_COMMUNITY): Payer: Self-pay | Admitting: Behavioral Health

## 2018-04-10 LAB — LIPID PANEL
CHOLESTEROL: 227 mg/dL — AB (ref 0–200)
HDL: 130 mg/dL (ref >40)
LDL Cholesterol: 84 mg/dL (ref 0–99)
Total CHOL/HDL Ratio: 1.7 RATIO
Triglycerides: 64 mg/dL (ref <150)
VLDL: 13 mg/dL (ref 0–40)

## 2018-04-10 LAB — TSH: TSH: 1.167 u[IU]/mL (ref 0.350–4.500)

## 2018-04-10 LAB — HEMOGLOBIN A1C
Hgb A1c MFr Bld: 5.5 % (ref 4.8–5.6)
Mean Plasma Glucose: 111.15 mg/dL

## 2018-04-10 MED ORDER — GABAPENTIN 100 MG PO CAPS
200.0000 mg | ORAL_CAPSULE | Freq: Three times a day (TID) | ORAL | Status: DC
  Administered 2018-04-10 – 2018-04-12 (×6): 200 mg via ORAL
  Filled 2018-04-10 (×10): qty 2

## 2018-04-10 MED ORDER — CLONIDINE HCL 0.1 MG PO TABS: 0.1000 mg | ORAL_TABLET | Freq: Two times a day (BID) | ORAL | Status: DC

## 2018-04-10 MED ORDER — IBUPROFEN 600 MG PO TABS
600.0000 mg | ORAL_TABLET | Freq: Four times a day (QID) | ORAL | Status: DC | PRN
  Administered 2018-04-10 – 2018-04-12 (×3): 600 mg via ORAL
  Filled 2018-04-10 (×3): qty 1

## 2018-04-10 MED ORDER — CLONIDINE HCL 0.1 MG PO TABS: 0.0500 mg | ORAL_TABLET | Freq: Two times a day (BID) | ORAL | Status: DC

## 2018-04-10 MED ORDER — CLONIDINE HCL 0.1 MG PO TABS
0.0500 mg | ORAL_TABLET | Freq: Two times a day (BID) | ORAL | Status: DC
  Administered 2018-04-10 – 2018-04-13 (×6): 0.05 mg via ORAL
  Filled 2018-04-10 (×4): qty 0.5
  Filled 2018-04-10: qty 1
  Filled 2018-04-10 (×4): qty 0.5
  Filled 2018-04-10: qty 1

## 2018-04-10 NOTE — BHH Group Notes (Signed)
LCSW Group Therapy Note  04/10/2018 3:26 PM  Type of Therapy and Topic: Group Therapy: Avoiding Self-Sabotaging and Enabling Behaviors  Participation Level: Active  Description of Group:  In this group, patients will learn how to identify obstacles, self-sabotaging and enabling behaviors, as well as: what are they, why do we do them and what needs these behaviors meet. Discuss unhealthy relationships and how to have positive healthy boundaries with those that sabotage and enable. Explore aspects of self-sabotage and enabling in yourself and how to limit these self-destructive behaviors in everyday life.  Therapeutic Goals: 1. Patient will identify one obstacle that relates to self-sabotage and enabling behaviors 2. Patient will identify one personal self-sabotaging or enabling behavior they did prior to admission 3. Patient will state a plan to change the above identified behavior 4. Patient will demonstrate ability to communicate their needs through discussion and/or role play.   Summary of Patient Progress: Patient shared that a self-sabotaging thought he has is that "I don't deserve happiness." Patient shared that drinking has sabotaged his relationship with his wife and children. He shared he wants to think more positively of himself.   Therapeutic Modalities:  Cognitive Behavioral Therapy Person-Centered Therapy Motivational Interviewing  Enid Cutter, MSW, [employer] Clinical Social Worker

## 2018-04-10 NOTE — Tx Team (Signed)
Interdisciplinary Treatment and Diagnostic Plan Update  04/10/2018 Time of Session: 10:00am Derek [name] MRN: [id number]  Principal Diagnosis: Alcohol use disorder, severe, dependence (HCC)  Secondary Diagnoses: Principal Problem:   Alcohol use disorder, severe, dependence (HCC) Active Problems:   MDD (major depressive disorder), severe (HCC)   Current Medications:  Current Facility-Administered Medications  Medication Dose Route Frequency Provider Last Rate Last Dose  . acetaminophen (TYLENOL) tablet 650 mg  650 mg Oral Q6H PRN Kerry Hough, PA-C      . alum & mag hydroxide-simeth (MAALOX/MYLANTA) 200-200-20 MG/5ML suspension 30 mL  30 mL Oral Q4H PRN [name] E, PA-C      . cloNIDine (CATAPRES) tablet 0.1 mg  0.1 mg Oral BID Money, Feliz Beam B, FNP   0.1 mg at 04/10/18 0759  . DULoxetine (CYMBALTA) DR capsule 30 mg  30 mg Oral Daily Money, Gerlene Burdock, FNP   30 mg at 04/10/18 0759  . gabapentin (NEURONTIN) capsule 100 mg  100 mg Oral TID Cobos, Rockey Situ, MD   100 mg at 04/10/18 0758  . hydrOXYzine (ATARAX/VISTARIL) tablet 25 mg  25 mg Oral Q6H PRN [name] E, PA-C      . loperamide (IMODIUM) capsule 2-4 mg  2-4 mg Oral PRN Kerry Hough, PA-C      . LORazepam (ATIVAN) tablet 1 mg  1 mg Oral Q6H PRN Kerry Hough, PA-C      . LORazepam (ATIVAN) tablet 1 mg  1 mg Oral TID [name] E, PA-C   1 mg at 04/10/18 0802   Followed by  . [START ON 04/11/2018] LORazepam (ATIVAN) tablet 1 mg  1 mg Oral BID [name] E, PA-C       Followed by  . [START ON 04/12/2018] LORazepam (ATIVAN) tablet 1 mg  1 mg Oral Daily Simon, Spencer E, PA-C      . magnesium hydroxide (MILK OF MAGNESIA) suspension 30 mL  30 mL Oral Daily PRN Kerry Hough, PA-C      . multivitamin with minerals tablet 1 tablet  1 tablet Oral Daily Kerry Hough, PA-C   1 tablet at 04/10/18 0759  . ondansetron (ZOFRAN-ODT) disintegrating tablet 4 mg  4 mg Oral Q6H PRN Kerry Hough, PA-C   4 mg at  04/09/18 2027  . thiamine (VITAMIN B-1) tablet 100 mg  100 mg Oral Daily [name] E, PA-C   100 mg at 04/10/18 0759  . traZODone (DESYREL) tablet 50 mg  50 mg Oral QHS PRN Money, Gerlene Burdock, FNP       PTA Medications: Medications Prior to Admission  Medication Sig Dispense Refill Last Dose  . acamprosate (CAMPRAL) 333 MG tablet Take 2 tablets (666 mg total) by mouth 3 (three) times daily. For alcoholism (Patient not taking: Reported on 04/08/2018) 180 tablet 0 Not Taking at Unknown time  . FLUoxetine (PROZAC) 40 MG capsule Take 1 capsule (40 mg total) by mouth daily. For depression (Patient not taking: Reported on 04/08/2018) 30 capsule 0 Not Taking at Unknown time  . gabapentin (NEURONTIN) 100 MG capsule Take 1 capsule (100 mg total) by mouth 3 (three) times daily. For agitation (Patient not taking: Reported on 04/08/2018) 90 capsule 0 Not Taking at Unknown time  . hydrOXYzine (ATARAX/VISTARIL) 50 MG tablet Take 1 tablet (50 mg total) by mouth every 6 (six) hours as needed for anxiety (anxiety/agitation). (Patient not taking: Reported on 04/08/2018) 75 tablet 0 Not Taking at Unknown time  . Multiple Vitamin (  MULTIVITAMIN WITH MINERALS) TABS tablet Take 1 tablet by mouth daily. Vitamin supplement (Patient not taking: Reported on 04/08/2018)   Not Taking at Unknown time  . traZODone (DESYREL) 50 MG tablet Take 1 tablet (50 mg total) by mouth at bedtime as needed for sleep. (Patient not taking: Reported on 02/24/2018) 30 tablet 0 Not Taking at Unknown time    Patient Stressors: Financial difficulties Marital or family conflict Occupational concerns Substance abuse  Patient Strengths: Ability for insight Capable of independent living Communication skills Special hobby/interest Work skills  Treatment Modalities: Medication Management, Group therapy, Case management,  1 to 1 session with clinician, Psychoeducation, Recreational therapy.   Physician Treatment Plan for Primary Diagnosis: Alcohol use  disorder, severe, dependence (HCC) Long Term Goal(s): Improvement in symptoms so as ready for discharge Improvement in symptoms so as ready for discharge   Short Term Goals: Ability to identify changes in lifestyle to reduce recurrence of condition will improve Ability to verbalize feelings will improve Ability to disclose and discuss suicidal ideas Ability to demonstrate self-control will improve Ability to identify triggers associated with substance abuse/mental health issues will improve Ability to identify and develop effective coping behaviors will improve Ability to maintain clinical measurements within normal limits will improve Compliance with prescribed medications will improve Ability to identify triggers associated with substance abuse/mental health issues will improve  Medication Management: Evaluate patient's response, side effects, and tolerance of medication regimen.  Therapeutic Interventions: 1 to 1 sessions, Unit Group sessions and Medication administration.  Evaluation of Outcomes: Progressing  Physician Treatment Plan for Secondary Diagnosis: Principal Problem:   Alcohol use disorder, severe, dependence (HCC) Active Problems:   MDD (major depressive disorder), severe (HCC)  Long Term Goal(s): Improvement in symptoms so as ready for discharge Improvement in symptoms so as ready for discharge   Short Term Goals: Ability to identify changes in lifestyle to reduce recurrence of condition will improve Ability to verbalize feelings will improve Ability to disclose and discuss suicidal ideas Ability to demonstrate self-control will improve Ability to identify triggers associated with substance abuse/mental health issues will improve Ability to identify and develop effective coping behaviors will improve Ability to maintain clinical measurements within normal limits will improve Compliance with prescribed medications will improve Ability to identify triggers associated  with substance abuse/mental health issues will improve     Medication Management: Evaluate patient's response, side effects, and tolerance of medication regimen.  Therapeutic Interventions: 1 to 1 sessions, Unit Group sessions and Medication administration.  Evaluation of Outcomes: Progressing   RN Treatment Plan for Primary Diagnosis: Alcohol use disorder, severe, dependence (HCC) Long Term Goal(s): Knowledge of disease and therapeutic regimen to maintain health will improve  Short Term Goals: Ability to demonstrate self-control, Ability to participate in decision making will improve, Ability to disclose and discuss suicidal ideas and Ability to identify and develop effective coping behaviors will improve  Medication Management: RN will administer medications as ordered by provider, will assess and evaluate patient's response and provide education to patient for prescribed medication. RN will report any adverse and/or side effects to prescribing provider.  Therapeutic Interventions: 1 on 1 counseling sessions, Psychoeducation, Medication administration, Evaluate responses to treatment, Monitor vital signs and CBGs as ordered, Perform/monitor CIWA, COWS, AIMS and Fall Risk screenings as ordered, Perform wound care treatments as ordered.  Evaluation of Outcomes: Progressing   LCSW Treatment Plan for Primary Diagnosis: Alcohol use disorder, severe, dependence (HCC) Long Term Goal(s): Safe transition to appropriate next level of care at discharge,  Engage patient in therapeutic group addressing interpersonal concerns.  Short Term Goals: Engage patient in aftercare planning with referrals and resources, Increase social support, Identify triggers associated with mental health/substance abuse issues and Increase skills for wellness and recovery  Therapeutic Interventions: Assess for all discharge needs, 1 to 1 time with Social worker, Explore available resources and support systems, Assess for  adequacy in community support network, Educate family and significant other(s) on suicide prevention, Complete Psychosocial Assessment, Interpersonal group therapy.  Evaluation of Outcomes: Progressing   Progress in Treatment: Attending groups: Yes. Participating in groups: Yes. Taking medication as prescribed: Yes.  Toleration medication: Yes. Says it makes him groggy Family/Significant other contact made: No, will contact:  supports if consents are granted Patient understands diagnosis: Yes. Discussing patient identified problems/goals with staff: Yes. Medical problems stabilized or resolved: Yes. Denies suicidal/homicidal ideation: No. Issues/concerns per patient self-inventory: Yes.  New problem(s) identified: Yes, Describe:  homeless  New Short Term/Long Term Goal(s): detox, medication management for mood stabilization; elimination of SI thoughts; development of comprehensive mental wellness/sobriety plan.  Patient Goals: Patient is not sure, interested in medication adjustments  Discharge Plan or Barriers: Patient requested more time to consider what follow up he is interested in. [facility] pamphlet, Mobile Crisis information, and AA/NA information provided to patient for additional community support and resources.   Reason for Continuation of Hospitalization: Anxiety Depression Suicidal ideation  Estimated Length of Stay: 3-5 days  Attendees: Patient: Derek [name] 04/10/2018 8:41 AM  Physician:  04/10/2018 8:41 AM  Nursing:  04/10/2018 8:41 AM  RN Care Manager: 04/10/2018 8:41 AM  Social Worker: Enid Cutter, LCSWA 04/10/2018 8:41 AM  Recreational Therapist:  04/10/2018 8:41 AM  Other:  04/10/2018 8:41 AM  Other:  04/10/2018 8:41 AM  Other: 04/10/2018 8:41 AM    Scribe for Treatment Team: Darreld Mclean, LCSWA 04/10/2018 8:41 AM

## 2018-04-10 NOTE — Progress Notes (Signed)
D: Patient observed briefly up and out of room to retrieve laundry this evening. Patient is very flat and depressed. Slow to respond, speech is soft. Patient states, "I"m not doing well. I feel nauseated, down, anxious."  Patient's affect, mood congruent with his report.   Denies pain. CIWA is a "6" and BP is 137/93.   A: Medicated per orders, prn zofran given for nausea.Med education provided. Asked patient return anytime after 2100 for hs meds however patient has been asleep. 2200  ativan held. Level III obs in place for safety. Emotional support offered. Patient encouraged to complete Suicide Safety Plan before discharge. Encouraged to attend and participate in unit programming.  Fall prevention plan in place and reviewed with patient as pt is a high fall risk due to detox.   R: Patient verbalizes understanding of POC, falls prevention education. On reassess of zofran, patient was asleep. Patient denies SI/HI/AVH and remains safe on level III obs. Will continue to monitor throughout the night.

## 2018-04-10 NOTE — Progress Notes (Signed)
D Pt is observed OOB UAL on the 300 hall today. He says he doesn't feel "quite right". He endorses a flat, depressed, sad affect. \   A He completed his daily assessment and on this he wrote he denied SI today and he rated his depression, hopelessness and anxoety " 09/11/08", respectively. HE is attentive in his groups.    R Safety is in palce

## 2018-04-10 NOTE — Progress Notes (Addendum)
[facility] MD Progress Note  04/10/2018 10:11 AM Derek [name]  MRN:  [id number] Subjective:  Patient states," I still feel pretty bad. I continue to have shakes and tremors in my hands. I have ongoing numbness in my feet. I feel sweaty and clamsy at times."   Objective: Face to face evaluation completed, case discussed during treatment team and chart reviewed. In brief; 42, married/ currently separated ,  has four children,( two miinor children, currently with their mother). Currently homeless, unemployed. He  had recently left an [address] , following alcohol relapse.  Presented to hospital voluntarily due to relapse, worsening depression, passive suicidal ideations, without plan or intention. Reports feeling he did not care if he lived or died . He has a history of Alcohol Use Disorder, MDD. In regards to his history of alcohol dependence, reports he had been sober x several weeks until relapse last week. Cannot identify any specific triggers for relapse . Since then has been drinking 1/5th of liquor and 12 beers per day .  Today, he is alert and oriented x3, calm and cooperative. He endorses withdrawal symptoms as noted above. Describes mood as depressed with ongoing feelings of hopelessness. Denies active or passive suicidal ideation or homicidal ideations. Denies hallucinations, paranoid thoughts or other psychotic symptoms. Endorsed dizziness upon standing as well as numbness in bilateral feet. He does have history of peripheral neuropathy. He is on medications as noted below which are being adjured as noted due to current complaints. He did not voice concerns in regard to appetite or sleeping pattern. Vitals reviewed and blood pressure was 108/72 which was much lower than the 130-140's/90's which had previously been recorded.  He contracts for safety.      Principal Problem: Alcohol use disorder, severe, dependence (HCC) Diagnosis: Principal Problem:   Alcohol use disorder, severe, dependence  (HCC) Active Problems:   MDD (major depressive disorder), severe (HCC)  Total Time spent with patient: 20 minutes  Past Psychiatric History: Alcohol abuse for 11 years, MDD, Patient has had several psychiatric hospitalization related to alcohol related issues. He has been seen in the emergency room on multiple occasions. His last psychiatric hospitalization at our facility was in December 2019. He has been previously treated with several antidepressant medications. He also went to substance abuse rehabilitation facility more than 1 to 2 years ago.  Past Medical History:  Past Medical History:  Diagnosis Date  . Alcohol abuse   . Anxiety   . Depression   . Essential hypertension 11/08/2015  . Homelessness   . Hypertension   . Obesity (BMI 30.0-34.9) 11/08/2015  . Sinus bradycardia 11/08/2015    Past Surgical History:  Procedure Laterality Date  . CHOLECYSTECTOMY    . GASTRIC BYPASS     2006 - done at [facility]  . ROTATOR CUFF REPAIR  2017  . ROUX-EN-Y PROCEDURE  2007  . SHOULDER ARTHROSCOPY WITH SUBACROMIAL DECOMPRESSION Right 04/17/2015   Procedure: SHOULDER ARTHROSCOPY ROTATOR CUFF DEBRIDEMENT WITH SUBACROMIAL DECOMPRESSION;  Surgeon: Jones Broom, MD;  Location: [facility];  Service: Orthopedics;  Laterality: Right;  . TONSILLECTOMY    . wisdom teeth extractions     Family History:  Family History  Problem Relation Age of Onset  . Cancer Mother   . Peripheral Artery Disease Father   . Heart attack Maternal Grandfather    Family Psychiatric  History: He does have substance abuse in his family history. Also his mother had depression. Social History:  Social History   Substance and  Sexual Activity  Alcohol Use Yes   Comment: one fifth per day     Social History   Substance and Sexual Activity  Drug Use Not Currently    Social History   Socioeconomic History  . Marital status: Married    Spouse name: Not on file  . Number of children: Not on file   . Years of education: Not on file  . Highest education level: Not on file  Occupational History  . Not on file  Social Needs  . Financial resource strain: Not on file  . Food insecurity:    Worry: Not on file    Inability: Not on file  . Transportation needs:    Medical: Not on file    Non-medical: Not on file  Tobacco Use  . Smoking status: Never Smoker  . Smokeless tobacco: Never Used  Substance and Sexual Activity  . Alcohol use: Yes    Comment: one fifth per day  . Drug use: Not Currently  . Sexual activity: Not Currently  Lifestyle  . Physical activity:    Days per week: Not on file    Minutes per session: Not on file  . Stress: Not on file  Relationships  . Social connections:    Talks on phone: Not on file    Gets together: Not on file    Attends religious service: Not on file    Active member of club or organization: Not on file    Attends meetings of clubs or organizations: Not on file    Relationship status: Not on file  Other Topics Concern  . Not on file  Social History Narrative  . Not on file   Additional Social History:    Sleep: Fair  Appetite:  Fair  Current Medications: Current Facility-Administered Medications  Medication Dose Route Frequency Provider Last Rate Last Dose  . acetaminophen (TYLENOL) tablet 650 mg  650 mg Oral Q6H PRN Kerry Hough, PA-C      . alum & mag hydroxide-simeth (MAALOX/MYLANTA) 200-200-20 MG/5ML suspension 30 mL  30 mL Oral Q4H PRN [name] E, PA-C      . cloNIDine (CATAPRES) tablet 0.05 mg  0.05 mg Oral BID Denzil Magnuson, NP      . DULoxetine (CYMBALTA) DR capsule 30 mg  30 mg Oral Daily Money, Gerlene Burdock, FNP   30 mg at 04/10/18 0759  . gabapentin (NEURONTIN) capsule 200 mg  200 mg Oral TID Denzil Magnuson, NP      . hydrOXYzine (ATARAX/VISTARIL) tablet 25 mg  25 mg Oral Q6H PRN [name] E, PA-C      . loperamide (IMODIUM) capsule 2-4 mg  2-4 mg Oral PRN Kerry Hough, PA-C      . LORazepam  (ATIVAN) tablet 1 mg  1 mg Oral Q6H PRN Kerry Hough, PA-C      . LORazepam (ATIVAN) tablet 1 mg  1 mg Oral TID [name] E, PA-C   1 mg at 04/10/18 0802   Followed by  . [START ON 04/11/2018] LORazepam (ATIVAN) tablet 1 mg  1 mg Oral BID [name] E, PA-C       Followed by  . [START ON 04/12/2018] LORazepam (ATIVAN) tablet 1 mg  1 mg Oral Daily Simon, Spencer E, PA-C      . magnesium hydroxide (MILK OF MAGNESIA) suspension 30 mL  30 mL Oral Daily PRN [name] E, PA-C      . multivitamin with minerals tablet 1 tablet  1 tablet Oral Daily Kerry HoughSimon, Spencer E, PA-C   1 tablet at 04/10/18 0759  . ondansetron (ZOFRAN-ODT) disintegrating tablet 4 mg  4 mg Oral Q6H PRN Kerry HoughSimon, Spencer E, PA-C   4 mg at 04/09/18 2027  . thiamine (VITAMIN B-1) tablet 100 mg  100 mg Oral Daily Donell SievertSimon, Spencer E, PA-C   100 mg at 04/10/18 0759  . traZODone (DESYREL) tablet 50 mg  50 mg Oral QHS PRN Money, Gerlene Burdockravis B, FNP        Lab Results:  Results for orders placed or performed during the hospital encounter of 04/09/18 (from the past 48 hour(s))  TSH     Status: None   Collection Time: 04/10/18  6:20 AM  Result Value Ref Range   TSH 1.167 0.350 - 4.500 uIU/mL    Comment: Performed by a 3rd Generation assay with a functional sensitivity of <=0.01 uIU/mL. Performed at [facility], [address] [place], [place] [address]   Lipid panel     Status: Abnormal   Collection Time: 04/10/18  6:20 AM  Result Value Ref Range   Cholesterol 227 (H) 0 - 200 mg/dL   Triglycerides 64 <604<150 mg/dL   HDL 540130 >98>40 mg/dL   Total CHOL/HDL Ratio 1.7 RATIO   VLDL 13 0 - 40 mg/dL   LDL Cholesterol 84 0 - 99 mg/dL    Comment:        Total Cholesterol/HDL:CHD Risk Coronary Heart Disease Risk Table                     Men   Women  1/2 Average Risk   3.4   3.3  Average Risk       5.0   4.4  2 X Average Risk   9.6   7.1  3 X Average Risk  23.4   11.0        Use the calculated Patient Ratio above and  the CHD Risk Table to determine the patient's CHD Risk.        ATP III CLASSIFICATION (LDL):  <100     mg/dL   Optimal  [id number]  mg/dL   Near or Above                    Optimal  130-159  mg/dL   Borderline  [id number]  mg/dL   High  >130>190     mg/dL   Very High Performed at [facility], [address] [place], [place] [address]   Hemoglobin A1c     Status: None   Collection Time: 04/10/18  6:20 AM  Result Value Ref Range   Hgb A1c MFr Bld 5.5 4.8 - 5.6 %    Comment: (NOTE) Pre diabetes:          5.7%-6.4% Diabetes:              >6.4% Glycemic control for   <7.0% adults with diabetes    Mean Plasma Glucose 111.15 mg/dL    Comment: Performed at [facility], [address] [place], [place] [address]    Blood Alcohol level:  Lab Results  Component Value Date   ETH 115 (H) 04/08/2018   ETH 100 (H) 02/24/2018    Metabolic Disorder Labs: Lab Results  Component Value Date   HGBA1C 5.5 04/10/2018   MPG 111.15 04/10/2018   No results found for: PROLACTIN Lab Results  Component Value Date   CHOL 227 (H)  04/10/2018   TRIG 64 04/10/2018   HDL 130 04/10/2018   CHOLHDL 1.7 04/10/2018   VLDL 13 04/10/2018   LDLCALC 84 04/10/2018    Physical Findings: AIMS:  , ,  ,  ,    CIWA:  CIWA-Ar Total: 6 COWS:     Musculoskeletal: Strength & Muscle Tone: within normal limits Gait & Station: normal Patient leans: N/A  Psychiatric Specialty Exam: Physical Exam  Nursing note and vitals reviewed. Constitutional: He is oriented to person, place, and time.  Neurological: He is alert and oriented to person, place, and time.    Review of Systems  Psychiatric/Behavioral: Positive for depression and substance abuse. Negative for hallucinations, memory loss and suicidal ideas. The patient is nervous/anxious and has insomnia.   All other systems reviewed and are negative.   Blood pressure 108/72, pulse 87, temperature 97.8 F (36.6 C), resp. rate 16, height 6'  1" (1.854 m), weight 108.9 kg, SpO2 98 %.Body mass index is 31.66 kg/m.  General Appearance: Fairly Groomed  Eye Contact:  Good  Speech:  Clear and Coherent and Normal Rate  Volume:  Normal  Mood:  Depressed  Affect:  Constricted  Thought Process:  Coherent, Goal Directed, Linear and Descriptions of Associations: Intact  Orientation:  Full (Time, Place, and Person)  Thought Content:  WDL denies hallucinations, no delusions , not internally preoccupied   Suicidal Thoughts:  No denies any current suicidal or self injurious ideations.  Homicidal Thoughts:  No denies homicidal or violent ideations  Memory:  Immediate;   Fair Recent;   Fair Remote;   Fair  Judgement:  Fair  Insight:  Fair  Psychomotor Activity:  Normal  Concentration:  Concentration: Fair and Attention Span: Fair  Recall:  [employer] of Knowledge:  Fair  Language:  Good  Akathisia:  Negative  Handed:  Right  AIMS (if indicated):     Assets:  Communication Skills Desire for Improvement Resilience Social Support  ADL's:  Intact  Cognition:  WNL  Sleep:  Number of Hours: 7.5     Treatment Plan Summary: Reviewed current treatment plan 04/10/2018. Have made adjustments to plan where noted.   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  Continued Cymbalta 30 mgrs QDAY for depression, anxiety Increased Neurontin to 200 mgrs TID for anxiety, pain Decrease Clonidine 0.05 mgrs BID - patient endorses dizziness. BP does show a decline. Patient encouraged to rise slowly and increase fluid intake.   Patient will continue to participate in group sessions held throughout the unit milieu .     Labs resulted today: TSH and A1c normal. Lipid panel  cholesterol 227 otherwise normal.    Denzil Magnuson, NP 04/10/2018, 10:11 AM   Agree with NP progress  note

## 2018-04-10 NOTE — Progress Notes (Signed)
CSW met with patient at bedside to discuss referrals. Patient reports he would like another day to think about what follow up treatment or referrals he is interested in. CSW reminded patient that [facility] is an acute care facility and that referrals are generally not reviews by outside facilities or agencies on the weekend. Patient verbalized understanding, says he will think about it.  Stephanie Acre, LCSW-A Clinical Social Worker

## 2018-04-10 NOTE — Progress Notes (Signed)
D: Pt was in bed in his room upon initial approach.  Pt presents with depressed affect and mood.  Pt reports he has "been real groggy and sleepy" today.  His goal is to "try to get better."  He reports he feels like he is adjusting to his medications.  Pt denies SI/HI, denies hallucinations, reports chronic back pain of 6/10.  Pt has been visible in milieu interacting with peers and staff appropriately.    A: Introduced self to pt.  Met with pt 1:1.  Actively listened to pt and offered support and encouragement.   PRN medication administered for sleep and pain.  Q15 minute safety checks maintained.  R: Pt is safe on the unit.  Pt is compliant with medications.  Pt verbally contracts for safety.  Will continue to monitor and assess.

## 2018-04-11 ENCOUNTER — Encounter (HOSPITAL_COMMUNITY): Payer: Self-pay | Admitting: Behavioral Health

## 2018-04-11 NOTE — Progress Notes (Signed)
Patient did attend the evening speaker AA meeting.  

## 2018-04-11 NOTE — [facility] Group Notes (Signed)
[facility] Group Notes:  (Nursing)  Date:  04/11/2018  Time:1:15 PM Type of Therapy:  Nurse Education  Participation Level:  Active  Participation Quality:  Appropriate  Affect:  Appropriate  Cognitive:  Appropriate  Insight:  Appropriate  Engagement in Group:  Engaged  Modes of Intervention:  Education  Summary of Progress/Problems: Identifying Needs Life Skills Group Shela Nevin 04/11/2018, 2:19 PM

## 2018-04-11 NOTE — [facility] Group Notes (Signed)
[facility] Group Notes: (Clinical Social Work)   04/11/2018      Type of Therapy:  Group Therapy   Participation Level:  Did Not Attend despite MHT prompting   Raeanne Gathers, LCSW 04/11/2018, 11:20 AM

## 2018-04-11 NOTE — Progress Notes (Signed)
D: Pt was in his room upon initial approach.  Pt presents with anxious affect and anxious, depressed mood.  He reports his day was "good."  His goal is "I've been wanting to follow doctor's and nurse's orders."  He reports the best part of his day was "getting to know a few of the people and starting to feel normal again."  Pt denies SI/HI, denies hallucinations, reports back pain of 6/10.  Pt has been visible in milieu interacting with peers and staff appropriately.  Pt attended evening group.    A: Introduced self to pt.  Met with pt 1:1.  Actively listened to pt and offered support and encouragement.  Medications administered per order.  PRN medication administered for anxiety, pain, and sleep.  Q15 minute safety checks maintained.  R: Pt is safe on the unit.  Pt is compliant with medications.  Pt verbally contracts for safety.  Will continue to monitor and assess.

## 2018-04-11 NOTE — Plan of Care (Signed)
  Problem: Education: Goal: Knowledge of [facility] Education information/materials will improve Outcome: Not Progressing Goal: Emotional status will improve Outcome: Not Progressing   

## 2018-04-11 NOTE — Progress Notes (Signed)
D Pt is observed UAL on the 300 hall today. HE remains quite sad, passive and ppreoccupied..he says " I guess something's not rieght.Marland Kitchenibuprofen just don't feel like my old slef".       AHe completed his daily assessment and on this he wrote he deneid SI today and he rated his depression , hopelessness and anxeity " 8/8/9", respectively. He says " I guess it's just gonna take some time before I feel like my old self".      R Safety in place. ENcouragement offered.

## 2018-04-11 NOTE — Progress Notes (Signed)
[facility] MD Progress Note  04/11/2018 9:03 AM Derek [name]  MRN:  [id number]   Subjective:  Patient states," I feel just slightly better as far as my mood. Just slightly but still feeling hopeless because of my situation  and wondering what is going to happen following discharge."   Objective: Face to face evaluation completed, case discussed during treatment team and chart reviewed. In brief; 80, married/ currently separated ,  has four children,( two miinor children, currently with their mother). Currently homeless, unemployed. He  had recently left an [address] , following alcohol relapse.  Presented to hospital voluntarily due to relapse, worsening depression, passive suicidal ideations, without plan or intention. Reports feeling he did not care if he lived or died . He has a history of Alcohol Use Disorder, MDD. In regards to his history of alcohol dependence, reports he had been sober x several weeks until relapse last week. Cannot identify any specific triggers for relapse . Since then has been drinking 1/5th of liquor and 12 beers per day .  Today, he is alert and oriented x3, calm and cooperative. He endorses slight improvement in mood although remains depressed in regard to not having a concrete discharge plan. He biggest stressor is homelessness and we discussed options such as [facility]. His palan was to contact [facility] today to see if thee were any beds available. He was encouraged to speak with LCSW for further resources if needed. He continues to endorse hopelessness, and withdrawal symptoms (tremors in hands) yet does note improvement. He denies thoughts of wanting to harm himself or others. Denies hallucinations, paranoid thoughts or other psychotic symptoms. Continues to endorse some dizziness upon standing as well as numbness in bilateral feet. His medications were adjusted (clonidine decreased and gabapentin in creased) and he was made aware of these medication changes to help  manage these symptoms. His vital signs are stable. He does have history of peripheral neuropathy. He reports sleeping better with Trazodone  although notes some next day grogginess. He reported being on Seroquel in the past and we discussed switching as an options although he preferred to remain on Trazodone for now. He denies concerns with appetite.Marland Kitchen He contracts for safety.      Principal Problem: Alcohol use disorder, severe, dependence (HCC) Diagnosis: Principal Problem:   Alcohol use disorder, severe, dependence (HCC) Active Problems:   MDD (major depressive disorder), severe (HCC)  Total Time spent with patient: 20 minutes  Past Psychiatric History: Alcohol abuse for 11 years, MDD, Patient has had several psychiatric hospitalization related to alcohol related issues. He has been seen in the emergency room on multiple occasions. His last psychiatric hospitalization at our facility was in December 2019. He has been previously treated with several antidepressant medications. He also went to substance abuse rehabilitation facility more than 1 to 2 years ago.  Past Medical History:  Past Medical History:  Diagnosis Date  . Alcohol abuse   . Anxiety   . Depression   . Essential hypertension 11/08/2015  . Homelessness   . Hypertension   . Obesity (BMI 30.0-34.9) 11/08/2015  . Sinus bradycardia 11/08/2015    Past Surgical History:  Procedure Laterality Date  . CHOLECYSTECTOMY    . GASTRIC BYPASS     2006 - done at [facility]  . ROTATOR CUFF REPAIR  2017  . ROUX-EN-Y PROCEDURE  2007  . SHOULDER ARTHROSCOPY WITH SUBACROMIAL DECOMPRESSION Right 04/17/2015   Procedure: SHOULDER ARTHROSCOPY ROTATOR CUFF DEBRIDEMENT WITH SUBACROMIAL DECOMPRESSION;  Surgeon: Jill Alexanders  Ave Filter, MD;  Location: [facility];  Service: Orthopedics;  Laterality: Right;  . TONSILLECTOMY    . wisdom teeth extractions     Family History:  Family History  Problem Relation Age of Onset  . Cancer Mother    . Peripheral Artery Disease Father   . Heart attack Maternal Grandfather    Family Psychiatric  History: He does have substance abuse in his family history. Also his mother had depression. Social History:  Social History   Substance and Sexual Activity  Alcohol Use Yes   Comment: one fifth per day     Social History   Substance and Sexual Activity  Drug Use Not Currently    Social History   Socioeconomic History  . Marital status: Married    Spouse name: Not on file  . Number of children: Not on file  . Years of education: Not on file  . Highest education level: Not on file  Occupational History  . Not on file  Social Needs  . Financial resource strain: Not on file  . Food insecurity:    Worry: Not on file    Inability: Not on file  . Transportation needs:    Medical: Not on file    Non-medical: Not on file  Tobacco Use  . Smoking status: Never Smoker  . Smokeless tobacco: Never Used  Substance and Sexual Activity  . Alcohol use: Yes    Comment: one fifth per day  . Drug use: Not Currently  . Sexual activity: Not Currently  Lifestyle  . Physical activity:    Days per week: Not on file    Minutes per session: Not on file  . Stress: Not on file  Relationships  . Social connections:    Talks on phone: Not on file    Gets together: Not on file    Attends religious service: Not on file    Active member of club or organization: Not on file    Attends meetings of clubs or organizations: Not on file    Relationship status: Not on file  Other Topics Concern  . Not on file  Social History Narrative  . Not on file   Additional Social History:    Sleep: " better"  Appetite:  Fair  Current Medications: Current Facility-Administered Medications  Medication Dose Route Frequency Provider Last Rate Last Dose  . alum & mag hydroxide-simeth (MAALOX/MYLANTA) 200-200-20 MG/5ML suspension 30 mL  30 mL Oral Q4H PRN [name] E, PA-C      . cloNIDine (CATAPRES)  tablet 0.05 mg  0.05 mg Oral BID Denzil Magnuson, NP   0.05 mg at 04/11/18 0723  . DULoxetine (CYMBALTA) DR capsule 30 mg  30 mg Oral Daily Money, Gerlene Burdock, FNP   30 mg at 04/11/18 0726  . gabapentin (NEURONTIN) capsule 200 mg  200 mg Oral TID Denzil Magnuson, NP   200 mg at 04/11/18 1610  . hydrOXYzine (ATARAX/VISTARIL) tablet 25 mg  25 mg Oral Q6H PRN Kerry Hough, PA-C      . ibuprofen (ADVIL,MOTRIN) tablet 600 mg  600 mg Oral Q6H PRN Nira Conn A, NP   600 mg at 04/10/18 2105  . loperamide (IMODIUM) capsule 2-4 mg  2-4 mg Oral PRN Kerry Hough, PA-C      . LORazepam (ATIVAN) tablet 1 mg  1 mg Oral Q6H PRN Kerry Hough, PA-C      . LORazepam (ATIVAN) tablet 1 mg  1 mg Oral BID  [name] E, PA-C   1 mg at 04/11/18 4327   Followed by  . [START ON 04/12/2018] LORazepam (ATIVAN) tablet 1 mg  1 mg Oral Daily Simon, Spencer E, PA-C      . magnesium hydroxide (MILK OF MAGNESIA) suspension 30 mL  30 mL Oral Daily PRN Kerry Hough, PA-C      . multivitamin with minerals tablet 1 tablet  1 tablet Oral Daily Kerry Hough, PA-C   1 tablet at 04/11/18 0723  . ondansetron (ZOFRAN-ODT) disintegrating tablet 4 mg  4 mg Oral Q6H PRN Kerry Hough, PA-C   4 mg at 04/09/18 2027  . thiamine (VITAMIN B-1) tablet 100 mg  100 mg Oral Daily [name] E, PA-C   100 mg at 04/11/18 6147  . traZODone (DESYREL) tablet 50 mg  50 mg Oral QHS PRN Money, Gerlene Burdock, FNP   50 mg at 04/10/18 2105    Lab Results:  Results for orders placed or performed during the hospital encounter of 04/09/18 (from the past 48 hour(s))  TSH     Status: None   Collection Time: 04/10/18  6:20 AM  Result Value Ref Range   TSH 1.167 0.350 - 4.500 uIU/mL    Comment: Performed by a 3rd Generation assay with a functional sensitivity of <=0.01 uIU/mL. Performed at [facility], [address] [place], [place] [address]   Lipid panel     Status: Abnormal   Collection Time: 04/10/18  6:20 AM   Result Value Ref Range   Cholesterol 227 (H) 0 - 200 mg/dL   Triglycerides 64 <747 mg/dL   HDL 340 >37 mg/dL   Total CHOL/HDL Ratio 1.7 RATIO   VLDL 13 0 - 40 mg/dL   LDL Cholesterol 84 0 - 99 mg/dL    Comment:        Total Cholesterol/HDL:CHD Risk Coronary Heart Disease Risk Table                     Men   Women  1/2 Average Risk   3.4   3.3  Average Risk       5.0   4.4  2 X Average Risk   9.6   7.1  3 X Average Risk  23.4   11.0        Use the calculated Patient Ratio above and the CHD Risk Table to determine the patient's CHD Risk.        ATP III CLASSIFICATION (LDL):  <100     mg/dL   Optimal  096-438  mg/dL   Near or Above                    Optimal  130-159  mg/dL   Borderline  381-840  mg/dL   High  >375     mg/dL   Very High Performed at [facility], [address], [place], [place] [address]   Hemoglobin A1c     Status: None   Collection Time: 04/10/18  6:20 AM  Result Value Ref Range   Hgb A1c MFr Bld 5.5 4.8 - 5.6 %    Comment: (NOTE) Pre diabetes:          5.7%-6.4% Diabetes:              >6.4% Glycemic control for   <7.0% adults with diabetes    Mean Plasma Glucose 111.15 mg/dL    Comment: Performed at [facility], [address] [place], [place] [address]    Blood Alcohol level:  Lab Results  Component Value Date   ETH 115 (H) 04/08/2018   ETH 100 (H) 02/24/2018    Metabolic Disorder Labs: Lab Results  Component Value Date   HGBA1C 5.5 04/10/2018   MPG 111.15 04/10/2018   No results found for: PROLACTIN Lab Results  Component Value Date   CHOL 227 (H) 04/10/2018   TRIG 64 04/10/2018   HDL 130 04/10/2018   CHOLHDL 1.7 04/10/2018   VLDL 13 04/10/2018   LDLCALC 84 04/10/2018    Physical Findings: AIMS:  , ,  ,  ,    CIWA:  CIWA-Ar Total: 4 COWS:     Musculoskeletal: Strength & Muscle Tone: within normal limits Gait & Station: normal Patient leans: N/A  Psychiatric Specialty Exam: Physical Exam   Nursing note and vitals reviewed. Constitutional: He is oriented to person, place, and time.  Neurological: He is alert and oriented to person, place, and time.    Review of Systems  Psychiatric/Behavioral: Positive for depression and substance abuse. Negative for hallucinations, memory loss and suicidal ideas. The patient is nervous/anxious and has insomnia.   All other systems reviewed and are negative.   Blood pressure 110/79, pulse 75, temperature 97.8 F (36.6 C), temperature source Oral, resp. rate 18, height 6\' 1"  (1.854 m), weight 108.9 kg, SpO2 98 %.Body mass index is 31.66 kg/m.  General Appearance: Fairly Groomed  Eye Contact:  Good  Speech:  Clear and Coherent and Normal Rate  Volume:  Normal  Mood:  Depressed  Affect:  Depressed  Thought Process:  Coherent, Goal Directed, Linear and Descriptions of Associations: Intact  Orientation:  Full (Time, Place, and Person)  Thought Content:  WDL denies hallucinations, no delusions , not internally preoccupied   Suicidal Thoughts:  No denies any current suicidal or self injurious ideations.  Homicidal Thoughts:  No denies homicidal or violent ideations  Memory:  Immediate;   Fair Recent;   Fair Remote;   Fair  Judgement:  Fair  Insight:  Fair  Psychomotor Activity:  Normal  Concentration:  Concentration: Fair and Attention Span: Fair  Recall:  [employer] of Knowledge:  Fair  Language:  Good  Akathisia:  Negative  Handed:  Right  AIMS (if indicated):     Assets:  Communication Skills Desire for Improvement Resilience Social Support  ADL's:  Intact  Cognition:  WNL  Sleep:  Number of Hours: 6.5     Treatment Plan Summary: Reviewed current treatment plan 04/11/2018. Will conitnue current plan with no adjustments at this time.  Daily contact with patient to assess and evaluate symptoms and progress in treatment   Dx- Alcohol Dependence, Alcohol Induced Mood Disorder/Depressed versus MDD Continued Ativan detox protocol  to minimize risk of WDL.  Continued Cymbalta 30 mgrs QDAY for depression, anxiety Continue Neurontin to 200 mgrs TID for anxiety, pain Conitnue Clonidine 0.05 mgrs BID - patient endorses dizziness. BP does show a decline. Patient encouraged to rise slowly and increase fluid intake.   Patient will continue to participate in group sessions held throughout the unit milieu .     Labs: No new labs resulted 04/11/2018.     Denzil Magnuson, NP 04/11/2018, 9:03 AM    Patient ID: Derek [name], male   DOB: 10/25/1960, 58 y.o.   MRN: [id number]

## 2018-04-12 MED ORDER — HYDROXYZINE HCL 25 MG PO TABS
25.0000 mg | ORAL_TABLET | Freq: Every evening | ORAL | Status: DC | PRN
  Administered 2018-04-12 (×2): 25 mg via ORAL
  Filled 2018-04-12: qty 1

## 2018-04-12 MED ORDER — GABAPENTIN 300 MG PO CAPS
300.0000 mg | ORAL_CAPSULE | Freq: Three times a day (TID) | ORAL | Status: DC
  Administered 2018-04-12 – 2018-04-13 (×4): 300 mg via ORAL
  Filled 2018-04-12 (×8): qty 1

## 2018-04-12 MED ORDER — ACAMPROSATE CALCIUM 333 MG PO TBEC
666.0000 mg | DELAYED_RELEASE_TABLET | Freq: Three times a day (TID) | ORAL | Status: DC
  Administered 2018-04-12 – 2018-04-13 (×4): 666 mg via ORAL
  Filled 2018-04-12 (×11): qty 2

## 2018-04-12 NOTE — Progress Notes (Signed)
D: Pt was in dayroom upon initial approach.  Pt presents with depressed affect and depressed, anxious mood.  He reports he had "a lot of anxiety today" and is unsure why.  His goal is to "do as y'all say."  He reports he has felt dizzy at times when he goes from sitting or lying down to standing.  Pt was encourage to go to standing position slowly and to seek staff assistance before trying to stand if he feels dizzy, light-headed, or unsteady.  Pt denies SI/HI, denies hallucinations, reports back pain of 8/10.  Pt has been visible in milieu interacting with peers and staff appropriately.  Pt attended evening group.    A: Met with pt 1:1.  Actively listened to pt and offered support and encouragement. PRN medication administered for pain and sleep.  Q15 minute safety checks maintained.  Fall prevention techniques reviewed with pt and he verbalized understanding.    R: Pt is safe on the unit.  Pt is compliant with medications.  Pt verbally contracts for safety.  Will continue to monitor and assess.

## 2018-04-12 NOTE — [facility] Group Notes (Addendum)
LCSW Group Therapy Note   04/12/2018 1:15pm   Type of Therapy and Topic:  Group Therapy:  Overcoming Obstacles   Participation Level:  Active    Description of Group:    In this group patients will be encouraged to explore what they see as obstacles to their own wellness and recovery. They will be guided to discuss their thoughts, feelings, and behaviors related to these obstacles. The group will process together ways to cope with barriers, with attention given to specific choices patients can make. Each patient will be challenged to identify changes they are motivated to make in order to overcome their obstacles. This group will be process-oriented, with patients participating in exploration of their own experiences as well as giving and receiving support and challenge from other group members.   Therapeutic Goals: 1. Patient will identify personal and current obstacles as they relate to admission. 2. Patient will identify barriers that currently interfere with their wellness or overcoming obstacles.  3. Patient will identify feelings, thought process and behaviors related to these barriers. 4. Patient will identify two changes they are willing to make to overcome these obstacles:      Summary of Patient Progress  Derek [name] was attentive and engaged during today's processing group. He shared that his biggest obstacle involves alcohol abuse and ongoing homelessness. "I was kicked out of [facility] and can't go back." Derek [name] is interested in learning more about the [employer] and reports that he did well in that city "I was able to stay sober awhile in [place]." Derek [name] is hoping to find work after detoxing and discharge in order to get back on his feet. Derek [name] is looking into rejoining AA for additional support. He continues to show progress in the group setting with improving insight.    Therapeutic Modalities:   Cognitive Behavioral Therapy Solution Focused Therapy Motivational  Interviewing Relapse Prevention Therapy  Rona Ravens, LCSW 04/12/2018 11:55 AM

## 2018-04-12 NOTE — Progress Notes (Signed)
[facility] MD Progress Note  04/12/2018 11:13 AM Derek [name]  MRN:  [id number]   Subjective:  Patient states," I feel like things are improving. I know I may have to go back on the streets and I have come to terms with that."   Objective: Face to face evaluation completed, case discussed during treatment team and chart reviewed. In brief; 23, married/ currently separated ,  has four children,( two miinor children, currently with their mother). Currently homeless, unemployed. He  had recently left an [address] , following alcohol relapse.  Presented to hospital voluntarily due to relapse, worsening depression, passive suicidal ideations, without plan or intention. Reports feeling he did not care if he lived or died . He has a history of Alcohol Use Disorder, MDD. In regards to his history of alcohol dependence, reports he had been sober x several weeks until relapse last week. Cannot identify any specific triggers for relapse . Since then has been drinking 1/5th of liquor and 12 beers per day .  Today, he is alert and oriented x3, calm and cooperative. Patient continues to present with improved mood and affect. He continues to report his main stressor at this time is not having a place to live following discharge although he is making calls [facility] to see if beds are available. He reports some hopelessness but reports this has improved compared to first day of admission. Reports improvement in withdrawal symptoms, numbness in lower extremities secondary to peripheral neuropathy, and dizziness. He reports he prefers not to take the Trazodone as it continue to cause next day grogginess.  Reports he prefers to use the Vistaril instead. He denies other medication related side effects. Reports in the past, he was on Campral for alcohol dependence and wishes to restart this medication. He denies thoughts of wanting to harm himself or others. Denies hallucinations, paranoid thoughts or other psychotic  symptoms. Denies concerns with sleep r appetite. He contracts for safety on the unit at this time.      Principal Problem: Alcohol use disorder, severe, dependence (HCC) Diagnosis: Principal Problem:   Alcohol use disorder, severe, dependence (HCC) Active Problems:   MDD (major depressive disorder), severe (HCC)  Total Time spent with patient: 20 minutes  Past Psychiatric History: Alcohol abuse for 11 years, MDD, Patient has had several psychiatric hospitalization related to alcohol related issues. He has been seen in the emergency room on multiple occasions. His last psychiatric hospitalization at our facility was in December 2019. He has been previously treated with several antidepressant medications. He also went to substance abuse rehabilitation facility more than 1 to 2 years ago.  Past Medical History:  Past Medical History:  Diagnosis Date  . Alcohol abuse   . Anxiety   . Depression   . Essential hypertension 11/08/2015  . Homelessness   . Hypertension   . Obesity (BMI 30.0-34.9) 11/08/2015  . Sinus bradycardia 11/08/2015    Past Surgical History:  Procedure Laterality Date  . CHOLECYSTECTOMY    . GASTRIC BYPASS     2006 - [facility]  . ROTATOR CUFF REPAIR  2017  . ROUX-EN-Y PROCEDURE  2007  . SHOULDER ARTHROSCOPY WITH SUBACROMIAL DECOMPRESSION Right 04/17/2015   Procedure: SHOULDER ARTHROSCOPY ROTATOR CUFF DEBRIDEMENT WITH SUBACROMIAL DECOMPRESSION;  Surgeon: Jones Broom, MD;  Location: [facility];  Service: Orthopedics;  Laterality: Right;  . TONSILLECTOMY    . wisdom teeth extractions     Family History:  Family History  Problem Relation Age  of Onset  . Cancer Mother   . Peripheral Artery Disease Father   . Heart attack Maternal Grandfather    Family Psychiatric  History: He does have substance abuse in his family history. Also his mother had depression. Social History:  Social History   Substance and Sexual Activity  Alcohol Use Yes    Comment: one fifth per day     Social History   Substance and Sexual Activity  Drug Use Not Currently    Social History   Socioeconomic History  . Marital status: Married    Spouse name: Not on file  . Number of children: Not on file  . Years of education: Not on file  . Highest education level: Not on file  Occupational History  . Not on file  Social Needs  . Financial resource strain: Not on file  . Food insecurity:    Worry: Not on file    Inability: Not on file  . Transportation needs:    Medical: Not on file    Non-medical: Not on file  Tobacco Use  . Smoking status: Never Smoker  . Smokeless tobacco: Never Used  Substance and Sexual Activity  . Alcohol use: Yes    Comment: one fifth per day  . Drug use: Not Currently  . Sexual activity: Not Currently  Lifestyle  . Physical activity:    Days per week: Not on file    Minutes per session: Not on file  . Stress: Not on file  Relationships  . Social connections:    Talks on phone: Not on file    Gets together: Not on file    Attends religious service: Not on file    Active member of club or organization: Not on file    Attends meetings of clubs or organizations: Not on file    Relationship status: Not on file  Other Topics Concern  . Not on file  Social History Narrative  . Not on file   Additional Social History:    Sleep: " better"  Appetite:  Fair  Current Medications: Current Facility-Administered Medications  Medication Dose Route Frequency Provider Last Rate Last Dose  . alum & mag hydroxide-simeth (MAALOX/MYLANTA) 200-200-20 MG/5ML suspension 30 mL  30 mL Oral Q4H PRN [name] E, PA-C      . cloNIDine (CATAPRES) tablet 0.05 mg  0.05 mg Oral BID Denzil Magnuson, NP   0.05 mg at 04/12/18 0754  . DULoxetine (CYMBALTA) DR capsule 30 mg  30 mg Oral Daily Money, Gerlene Burdock, FNP   30 mg at 04/12/18 0750  . gabapentin (NEURONTIN) capsule 200 mg  200 mg Oral TID Denzil Magnuson, NP   200 mg at  04/12/18 0750  . hydrOXYzine (ATARAX/VISTARIL) tablet 25 mg  25 mg Oral Q6H PRN Kerry Hough, PA-C   25 mg at 04/11/18 2200  . ibuprofen (ADVIL,MOTRIN) tablet 600 mg  600 mg Oral Q6H PRN Jackelyn Poling, NP   600 mg at 04/11/18 2159  . loperamide (IMODIUM) capsule 2-4 mg  2-4 mg Oral PRN Kerry Hough, PA-C      . LORazepam (ATIVAN) tablet 1 mg  1 mg Oral Q6H PRN Kerry Hough, PA-C   1 mg at 04/11/18 1200  . magnesium hydroxide (MILK OF MAGNESIA) suspension 30 mL  30 mL Oral Daily PRN [name] E, PA-C      . multivitamin with minerals tablet 1 tablet  1 tablet Oral Daily Kerry Hough, PA-C  1 tablet at 04/12/18 0750  . ondansetron (ZOFRAN-ODT) disintegrating tablet 4 mg  4 mg Oral Q6H PRN Kerry Hough, PA-C   4 mg at 04/09/18 2027  . thiamine (VITAMIN B-1) tablet 100 mg  100 mg Oral Daily [name] E, PA-C   100 mg at 04/12/18 0750  . traZODone (DESYREL) tablet 50 mg  50 mg Oral QHS PRN Money, Gerlene Burdock, FNP   50 mg at 04/11/18 2159    Lab Results:  No results found for this or any previous visit (from the past 48 hour(s)).  Blood Alcohol level:  Lab Results  Component Value Date   ETH 115 (H) 04/08/2018   ETH 100 (H) 02/24/2018    Metabolic Disorder Labs: Lab Results  Component Value Date   HGBA1C 5.5 04/10/2018   MPG 111.15 04/10/2018   No results found for: PROLACTIN Lab Results  Component Value Date   CHOL 227 (H) 04/10/2018   TRIG 64 04/10/2018   HDL 130 04/10/2018   CHOLHDL 1.7 04/10/2018   VLDL 13 04/10/2018   LDLCALC 84 04/10/2018    Physical Findings: AIMS:  , ,  ,  ,    CIWA:  CIWA-Ar Total: 4 COWS:     Musculoskeletal: Strength & Muscle Tone: within normal limits Gait & Station: normal Patient leans: N/A  Psychiatric Specialty Exam: Physical Exam  Nursing note and vitals reviewed. Constitutional: He is oriented to person, place, and time.  Neurological: He is alert and oriented to person, place, and time.    Review of  Systems  Psychiatric/Behavioral: Positive for depression and substance abuse. Negative for hallucinations, memory loss and suicidal ideas. The patient is nervous/anxious and has insomnia.   All other systems reviewed and are negative.   Blood pressure 102/67, pulse 66, temperature 97.8 F (36.6 C), temperature source Oral, resp. rate 18, height  (1.854 m), weight 108.9 kg, SpO2 98 %.Body mass index is 31.66 kg/m.  General Appearance: Fairly Groomed  Eye Contact:  Good  Speech:  Clear and Coherent and Normal Rate  Volume:  Normal  Mood:  Anxious and depression improving   Affect:  Depressed  Thought Process:  Coherent, Goal Directed, Linear and Descriptions of Associations: Intact  Orientation:  Full (Time, Place, and Person)  Thought Content:  WDL denies hallucinations, no delusions , not internally preoccupied   Suicidal Thoughts:  No denies any current suicidal or self injurious ideations.  Homicidal Thoughts:  No denies homicidal or violent ideations  Memory:  Immediate;   Fair Recent;   Fair Remote;   Fair  Judgement:  Fair  Insight:  Fair  Psychomotor Activity:  Normal  Concentration:  Concentration: Fair and Attention Span: Fair  Recall:  [employer] of Knowledge:  Fair  Language:  Good  Akathisia:  Negative  Handed:  Right  AIMS (if indicated):     Assets:  Communication Skills Desire for Improvement Resilience Social Support  ADL's:  Intact  Cognition:  WNL  Sleep:  Number of Hours: 5.5     Treatment Plan Summary: Reviewed current treatment plan 04/12/2018. Will conitnue current plan with  adjustments at this time.  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  Continued Cymbalta 30 mgrs QDAY for depression, anxiety Increased Neurontin to 300 mgrs TID for anxiety, pain Conitnue Clonidine  0.05 mgrs BID -  patient endorses dizziness. BP does show a decline. Patient encouraged to rise slowly and increase fluid intake. Re-started Campral  666 mg po TID for alcohol dependence   Discontinued Trazodone at patients request. Reordered Vistaril 25 mg po daily at bedtime for insomnia management.   Patient will continue to participate in group sessions held throughout the unit milieu .     Labs: No new labs resulted 04/12/2018.     Denzil Magnuson, NP 04/12/2018, 11:13 AM    Patient ID: Derek [name], male   DOB: 05-May-1960, 58 y.o.   MRN: [id number]

## 2018-04-12 NOTE — Plan of Care (Signed)
°  Problem: Education: °Goal: Emotional status will improve °Outcome: Progressing °Goal: Mental status will improve °Outcome: Progressing °Goal: Verbalization of understanding the information provided will improve °Outcome: Progressing °  °

## 2018-04-12 NOTE — Progress Notes (Signed)
D Patient remains anxious, somatically focused and ill at ease. WHen he is asked how he is doing, he immediately begins to describe his vague sense of ill ease. He wears hospital-issue patietn scrubs and he is seen by this Clinical research associate, to be in dayroom much of the day. He talks about how he tries not to " listen to other patients talk because this can cause me anxiety too".        A He completed his daily assessment and on this he wrote he denied SI and he rated his depression, hopelessness and anxiety " 8/9/9/"< respectively. HE got VERY anxious when he requested and was started on campral, but he has continued to receive it, not wanting it to be dc'd.He describes having free floating anxiety that he cannot connect with any event and or problem. " I just get this way sometimes".        R Safeyt is in place.

## 2018-04-13 MED ORDER — HYDROXYZINE HCL 25 MG PO TABS: 25.0000 mg | ORAL_TABLET | Freq: Every evening | ORAL | 0 refills | Status: DC | PRN

## 2018-04-13 MED ORDER — DULOXETINE HCL 30 MG PO CPEP: 30.0000 mg | ORAL_CAPSULE | Freq: Every day | ORAL | 0 refills | Status: AC

## 2018-04-13 MED ORDER — ACAMPROSATE CALCIUM 333 MG PO TBEC: 666.0000 mg | DELAYED_RELEASE_TABLET | Freq: Three times a day (TID) | ORAL | 0 refills | Status: AC

## 2018-04-13 MED ORDER — GABAPENTIN 300 MG PO CAPS: 300.0000 mg | ORAL_CAPSULE | Freq: Three times a day (TID) | ORAL | 0 refills | Status: AC

## 2018-04-13 MED ORDER — DULOXETINE HCL 30 MG PO CPEP: 30.0000 mg | ORAL_CAPSULE | Freq: Every day | ORAL | 0 refills | Status: DC

## 2018-04-13 MED ORDER — GABAPENTIN 300 MG PO CAPS: 300.0000 mg | ORAL_CAPSULE | Freq: Three times a day (TID) | ORAL | 0 refills | Status: DC

## 2018-04-13 MED ORDER — HYDROXYZINE HCL 25 MG PO TABS: 25.0000 mg | ORAL_TABLET | Freq: Every evening | ORAL | 0 refills | Status: AC | PRN

## 2018-04-13 MED ORDER — ACAMPROSATE CALCIUM 333 MG PO TBEC: 666.0000 mg | DELAYED_RELEASE_TABLET | Freq: Three times a day (TID) | ORAL | 0 refills | Status: DC

## 2018-04-13 NOTE — Progress Notes (Signed)
Adult Psychoeducational Group Note  Date:  04/13/2018 Time:  2:17 PM  Group Topic/Focus:  Goals Group:   The focus of this group is to help patients establish daily goals to achieve during treatment and discuss how the patient can incorporate goal setting into their daily lives to aide in recovery.  Participation Level:  Active  Participation Quality:  Appropriate  Affect:  Appropriate  Cognitive:  Alert  Insight: Appropriate  Engagement in Group:  Engaged  Modes of Intervention:  Discussion  Additional Comments:  Pt attended group and participated in discussion.  Rutherford Alarie R Channon Brougher 04/13/2018, 2:17 PM

## 2018-04-13 NOTE — Progress Notes (Signed)
  [facility] Adult Case Management Discharge Plan :  Will you be returning to the same living situation after discharge:  No. Staying with a friend tonight and then going to a shelter tomorrow.  At discharge, do you have transportation home?: Yes,  bus passes on chart Do you have the ability to pay for your medications: Yes,  private insurance. States wife will pay the copay  Release of information consent forms completed and in the chart; bus passes on chart.  Patient to Follow up at: Follow-up Information    Milagros Evener, MD Follow up on 04/14/2018.   Specialty:  Psychiatry Why:  Please attend your appointment with Dr. Evelene Croon on Tuesday, 3/10.  Contact information: [address] P.Tyson Babinski [place] [address] [phone]           Next level of care provider has access to [facility] Link:no  Safety Planning and Suicide Prevention discussed: Yes,  with wife  Have you used any form of tobacco in the last 30 days? (Cigarettes, Smokeless Tobacco, Cigars, and/or Pipes): No("I don't smoke")  Has patient been referred to the Quitline?: N/A patient is not a smoker  Patient has been referred for addiction treatment: Yes  Darreld Mclean, LCSWA 04/13/2018, 10:55 AM

## 2018-04-13 NOTE — Progress Notes (Signed)
D:  Patient's self inventory sheet, patient has good sleep, no sleep medication.  Good appetite, normal energy level, good concentration.  Rated depression 5, hopeless 6, anxiety 8.  Withdrawals, numb feet.  Denied SI.  Denied physical problems.  Physical pain, lower back, no pain medicine.  Goal is look for place to stay.  Plans to work with SW.  "I have a psych appointmetn tomorrow and need to discharge today.  I'm going to [facility] shelter after that."  No discharge plans. A:  Medications administered per MD orders.  Emotional support and encouragement given patient. R:  Denied SI and HI, contracts for safety.  Denied A/V hallucinations.  Safety maintained with 15 minute checks.

## 2018-04-13 NOTE — Discharge Summary (Addendum)
Physician Discharge Summary Note  Patient:  Derek [name] is an 58 y.o., male MRN:  [id number] DOB:  1961-01-24 Patient phone:  [phone] (home)  Patient address:   [address] [place] [address],  Total Time spent with patient: 15 minutes  Date of Admission:  04/09/2018 Date of Discharge: 04/13/2018  Reason for Admission:  Per assessment note: Patient is a 58 year old Caucasian male who presented to the ED due to relapse on alcohol approximately 5 days ago.  Patient states that he has a 11-year history of alcohol abuse and remained sober after his last admission at [facility] in December 2019.  He states that 5 days ago he impulsively started drinking and went on a 5-day binge.  He states that he refused to go back home to his wife and kids and was sleeping on the streets.  He stated he was staying at a halfway house and left there and has not returned.  He states that there was no stressors and no cues for his relapse.  He states that he used to be the VP of IT with a company and lost that job 2 years ago due to his drinking.  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  He denies any substance abuse other than alcohol.  He states that he was going to follow-up with [facility] for medications but he is still on his wife's insurance and is able to get his medications through regular pharmacy.  He reports that he was drinking approximately 1/5 of bourbon a day and if he could not get the bourbon that he was drinking between half to 1 case of beer a day.  He states that he chose not to go to a shelter because he did not feel like he wanted to be there and she has to sleep on the streets for the last 5 days.  He reports this medications from the past of Campral, Prozac, gabapentin, trazodone and, Vistaril did have some improvement on him, but he has no explanation of why he started drinking again other than impulsively picking up alcohol.  He reports  that he had gastric bypass approximately at age 15 and prior to that he was 400+ pounds and used to be on losartan for his blood pressure.  He reports symptoms of feeling depressed, hopelessness, not caring if he woke up the next day or not, fatigue, difficulty sleeping, and some anxiety about his situation.  He reports that he is hoping to get on some medications that can help with his impulsiveness and states that in the past he was started on stimulants for a short period of time by Dr. Evelene Croon and he stated that he did very well on it but understands that he will not be started back on stimulants as there is no one to prescribe with him now because of his substance abuse.  Patient reports having chronic pain in his lower back, his neck, and has some arthritis.  Discussed medications with him and he is interested in Cymbalta instead of Prozac and using clonidine for his impulsiveness and his alcohol abuse.  We will also restart his gabapentin, trazodone, Vistaril and he will be on a Ativan detox taper.   Principal Problem: Alcohol use disorder, severe, dependence (HCC) Discharge Diagnoses: Principal Problem:   Alcohol use disorder, severe, dependence (HCC) Active Problems:   MDD (major depressive disorder), severe (HCC)   Past  Psychiatric History: Alcohol abuse for 11 years, MDD, Patient has had several psychiatric hospitalization related to alcohol related issues. He has been seen in the emergency room on multiple occasions. His last psychiatric hospitalization at our facility was in December 2019. He has been previously treated with several antidepressant medications. He also went to substance abuse rehabilitation facility more than 1 to 2 years ago.  Past Medical History:  Past Medical History:  Diagnosis Date  . Alcohol abuse   . Anxiety   . Depression   . Essential hypertension 11/08/2015  . Homelessness   . Hypertension   . Obesity (BMI 30.0-34.9) 11/08/2015  . Sinus bradycardia  11/08/2015    Past Surgical History:  Procedure Laterality Date  . CHOLECYSTECTOMY    . GASTRIC BYPASS     2006 - [facility] at [facility]  . ROTATOR CUFF REPAIR  2017  . ROUX-EN-Y PROCEDURE  2007  . SHOULDER ARTHROSCOPY WITH SUBACROMIAL DECOMPRESSION Right 04/17/2015   Procedure: SHOULDER ARTHROSCOPY ROTATOR CUFF DEBRIDEMENT WITH SUBACROMIAL DECOMPRESSION;  Surgeon: Jones Broom, MD;  Location: [facility];  Service: Orthopedics;  Laterality: Right;  . TONSILLECTOMY    . wisdom teeth extractions     Family History:  Family History  Problem Relation Age of Onset  . Cancer Mother   . Peripheral Artery Disease Father   . Heart attack Maternal Grandfather    Family Psychiatric  History:  Social History:  Social History   Substance and Sexual Activity  Alcohol Use Yes   Comment: one fifth per day     Social History   Substance and Sexual Activity  Drug Use Not Currently    Social History   Socioeconomic History  . Marital status: Married    Spouse name: Not on file  . Number of children: Not on file  . Years of education: Not on file  . Highest education level: Not on file  Occupational History  . Not on file  Social Needs  . Financial resource strain: Not on file  . Food insecurity:    Worry: Not on file    Inability: Not on file  . Transportation needs:    Medical: Not on file    Non-medical: Not on file  Tobacco Use  . Smoking status: Never Smoker  . Smokeless tobacco: Never Used  Substance and Sexual Activity  . Alcohol use: Yes    Comment: one fifth per day  . Drug use: Not Currently  . Sexual activity: Not Currently  Lifestyle  . Physical activity:    Days per week: Not on file    Minutes per session: Not on file  . Stress: Not on file  Relationships  . Social connections:    Talks on phone: Not on file    Gets together: Not on file    Attends religious service: Not on file    Active member of club or organization: Not on file    Attends  meetings of clubs or organizations: Not on file    Relationship status: Not on file  Other Topics Concern  . Not on file  Social History Narrative  . Not on file    Hospital Course:  Derek [name] was admitted for Alcohol use disorder, severe, dependence (HCC) , with psychosis and crisis management.  Pt was treated discharged with the medications listed below under Medication List.  Medical problems were identified and treated as needed.  Home medications were restarted as appropriate.  Improvement was monitored by observation and  Derek [name] 's daily report of symptom reduction.  Emotional and mental status was monitored by daily self-inventory reports completed by Derek [name] and clinical staff.         Derek [name] was evaluated by the treatment team for stability and plans for continued recovery upon discharge. Derek [name] 's motivation was an integral factor for scheduling further treatment. Employment, transportation, bed availability, health status, family support, and any pending legal issues were also considered during hospital stay. Pt was offered further treatment options upon discharge including but not limited to Residential, Intensive Outpatient, and Outpatient treatment.  Derek [name] will follow up with the services as listed below under Follow Up Information.     Upon completion of this admission the patient was both mentally and medically stable for discharge denying suicidal or homicidal  ideation   Derek [name] responded well to treatment with Campreal 666 mg TID, Cymbalta 30 mg and Neurontin 300 mg TID  without adverse effects. Pt demonstrated improvement without reported or observed adverse effects to the point of stability appropriate for outpatient management. Pertinent labs include: Lipids, CMP and CBC  Depakote 120 (high), Prolactin 28 (high), Hgb A1C (high), for which outpatient follow-up is necessary for lab recheck as mentioned below. Reviewed CBC, CMP, BAL+ 115 and  UDS; all unremarkable aside from noted exceptions.   Physical Findings: AIMS:  , ,  ,  ,    CIWA:  CIWA-Ar Total: 4 COWS:     Musculoskeletal: Strength & Muscle Tone: within normal limits Gait & Station: normal Patient leans: N/A  Psychiatric Specialty Exam: See SRA by MD  Physical Exam  Vitals reviewed. Constitutional: He appears well-developed.  Psychiatric: He has a normal mood and affect. His behavior is normal.    Review of Systems  Psychiatric/Behavioral: Positive for substance abuse (etoh 115 on admission). Negative for depression (stable). The patient is not nervous/anxious.   All other systems reviewed and are negative.   Blood pressure (!) 130/93, pulse (!) 48, temperature 98 F (36.7 C), temperature source Oral, resp. rate 18, height 6\' 1"  (1.854 m), weight 108.9 kg, SpO2 98 %.Body mass index is 31.66 kg/m.    Have you used any form of tobacco in the last 30 days? (Cigarettes, Smokeless Tobacco, Cigars, and/or Pipes): No("I don't smoke")  Has this patient used any form of tobacco in the last 30 days? (Cigarettes, Smokeless Tobacco, Cigars, and/or Pipes), No  Blood Alcohol level:  Lab Results  Component Value Date   ETH 115 (H) 04/08/2018   ETH 100 (H) 02/24/2018    Metabolic Disorder Labs:  Lab Results  Component Value Date   HGBA1C 5.5 04/10/2018   MPG 111.15 04/10/2018   No results found for: PROLACTIN Lab Results  Component Value Date   CHOL 227 (H) 04/10/2018   TRIG 64 04/10/2018   HDL 130 04/10/2018   CHOLHDL 1.7 04/10/2018   VLDL 13 04/10/2018   LDLCALC 84 04/10/2018    See Psychiatric Specialty Exam and Suicide Risk Assessment completed by Attending Physician prior to discharge.  Discharge destination:  Home  Is patient on multiple antipsychotic therapies at discharge:  No   Has Patient had three or more failed trials of antipsychotic monotherapy by history:  No  Recommended Plan for Multiple Antipsychotic Therapies: NA  Discharge  Instructions    Diet - low sodium heart healthy   Complete by:  As directed    Discharge instructions   Complete by:  As directed    Take all  medications as prescribed. Keep all follow-up appointments as scheduled.  Do not consume alcohol or use illegal drugs while on prescription medications. Report any adverse effects from your medications to your primary care provider promptly.  In the event of recurrent symptoms or worsening symptoms, call 911, a crisis hotline, or go to the nearest emergency department for evaluation.   Increase activity slowly   Complete by:  As directed      Allergies as of 04/13/2018   No Known Allergies     Medication List    STOP taking these medications   FLUoxetine 40 MG capsule Commonly known as:  PROZAC   traZODone 50 MG tablet Commonly known as:  DESYREL     TAKE these medications     Indication  acamprosate 333 MG tablet Commonly known as:  CAMPRAL Take 2 tablets (666 mg total) by mouth 3 (three) times daily. What changed:  additional instructions  Indication:  Excessive Use of Alcohol   DULoxetine 30 MG capsule Commonly known as:  CYMBALTA Take 1 capsule (30 mg total) by mouth daily. Start taking on:  April 14, 2018  Indication:  Major Depressive Disorder   gabapentin 300 MG capsule Commonly known as:  NEURONTIN Take 1 capsule (300 mg total) by mouth 3 (three) times daily. What changed:    medication strength  how much to take  additional instructions  Indication:  mood stablization   hydrOXYzine 25 MG tablet Commonly known as:  ATARAX/VISTARIL Take 1 tablet (25 mg total) by mouth at bedtime as needed and may repeat dose one time if needed (INSOMNIA). What changed:    medication strength  how much to take  when to take this  reasons to take this  Indication:  Feeling Anxious   multivitamin with minerals Tabs tablet Take 1 tablet by mouth daily. Vitamin supplement  Indication:  Vitamin supplement      Follow-up  Information    Milagros Evener, MD Follow up on 04/14/2018.   Specialty:  Psychiatry Why:  Please attend your appointment with Dr. Evelene Croon on Tuesday, 3/10.  Contact information: [address] [place] [address] [phone]           Follow-up recommendations:  Activity:  as tolerated Diet:  heart healthy   Comments:  Take all medications as prescribed. Keep all follow-up appointments as scheduled.  Do not consume alcohol or use illegal drugs while on prescription medications. Report any adverse effects from your medications to your primary care provider promptly.  In the event of recurrent symptoms or worsening symptoms, call 911, a crisis hotline, or go to the nearest emergency department for evaluation.   Signed: Oneta Rack, NP 04/13/2018, 8:54 AM   Patient seen, Suicide Assessment Completed.  Disposition Plan Reviewed

## 2018-04-13 NOTE — Progress Notes (Signed)
Discharge Note:  Patient discharged with family.  Denied SI and HI.  Denied A/V hallucinations.  Suicide prevention information given and discussed with patient who stated he understood and had no questions.  My3 app for suicide prevention also given to patient.  Survey given to patient also.  Patient stated he received all his belongings, clothing, toiletries, misc items, etc.  Patient stated he appreciated all assistance received from [facility] staff.  All required discharge information given to patient at discharge.   

## 2018-04-13 NOTE — [facility] Suicide Risk Assessment (Addendum)
[facility] Discharge Suicide Risk Assessment   Principal Problem: Alcohol use disorder, severe, dependence (HCC) Discharge Diagnoses: Principal Problem:   Alcohol use disorder, severe, dependence (HCC) Active Problems:   MDD (major depressive disorder), severe (HCC)   Total Time spent with patient: 30 minutes  Musculoskeletal: Strength & Muscle Tone: within normal limits Gait & Station: normal Patient leans: N/A  Psychiatric Specialty Exam: ROS no headache, no chest pain, no shortness of breath, no vomiting , no dizziness , no lightheadedness   Blood pressure (!) 130/93, pulse (!) 48, temperature 98 F (36.7 C), temperature source Oral, resp. rate 18, height 6\' 1"  (1.854 m), weight 108.9 kg, SpO2 98 %.Body mass index is 31.66 kg/m.  General Appearance: Well Groomed  Eye Contact::  Good  Speech:  Normal Rate409  Volume:  Normal  Mood:  improving mood , states he is feeling noticeably different compared to admission  Affect:  Appropriate and more reactive  Thought Process:  Linear and Descriptions of Associations: Intact  Orientation:  Full (Time, Place, and Person)  Thought Content:  no hallucinations, no delusions, not internally preoccupied   Suicidal Thoughts:  No denies suicidal or self injurious ideations, denies homicidal or violent ideations  Homicidal Thoughts:  No  Memory:  recent and remote grossly intact   Judgement:  Other:  improving  Insight:  improving   Psychomotor Activity:  Normal- no tremors , no diaphoresis, no restlessness or agitation  Concentration:  Good  Recall:  Good  Fund of Knowledge:Good  Language: Good  Akathisia:  Negative  Handed:  Right  AIMS (if indicated):     Assets:  Communication Skills Desire for Improvement Resilience  Sleep:  Number of Hours: 5.75  Cognition: WNL  ADL's:  Intact   Mental Status Per Nursing Assessment::   On Admission:  NA(Denies at present)  Demographic Factors:  57, separated, currently homeless , had been  living in an [employer]   Loss Factors: Recent relapse . Homelessness   Historical Factors: History of alcohol use disorder, history of prior psychiatric admissions for depression  Risk Reduction Factors:   Sense of responsibility to family and Positive coping skills or problem solving skills  Continued Clinical Symptoms:  At this time patient is alert, attentive, reports significantly improved mood and presents with a fuller range of affect, no thought disorder, no SI or HI, no psychotic symptoms, future oriented . Currently patient plans to go to [facility] and initiate process to go to [facility].  Behavior on unit in good control, pleasant on approach. We reviewed current medication regimen- no side effects. Patient's pulse slightly low ( 48) - no associated symptoms. Will discontinue Clonidine , which was started on admission. Continue other medications .   Cognitive Features That Contribute To Risk:  No gross cognitive deficits noted upon discharge. Is alert , attentive, and oriented x 3    Suicide Risk:  Mild:  Suicidal ideation of limited frequency, intensity, duration, and specificity.  There are no identifiable plans, no associated intent, mild dysphoria and related symptoms, good self-control (both objective and subjective assessment), few other risk factors, and identifiable protective factors, including available and accessible social support.  Follow-up Information    Milagros Evener, MD Follow up on 04/14/2018.   Specialty:  Psychiatry Why:  Please attend your appointment with Dr. Evelene Croon on Tuesday, 3/10.  Contact information: [address] [place] [address] [phone]  Plan Of Care/Follow-up recommendations:  Activity:  as tolerated  Diet:  regular Tests:  NA Other:  See below  Patient is  expressing readiness for discharge, and there are no current grounds for involuntary  commitment Leaving unit in good spirits  Plans to follow up as above  Has an established PCP at [facility] in [facility] for medical issues as needed  Plans to participate in AA meetings regularly .  Craige Cotta, MD 04/13/2018, 11:58 AM

## 2018-04-13 NOTE — [facility] Suicide Risk Assessment (Signed)
[facility] INPATIENT:  Family/Significant Other Suicide Prevention Education  Suicide Prevention Education:  Education Completed; with wife, [name] [phone] has been identified by the patient as the family member/significant other with whom the patient will be residing, and identified as the person(s) who will aid the patient in the event of a mental health crisis (suicidal ideations/suicide attempt).  With written consent from the patient, the family member/significant other has been provided the following suicide prevention education, prior to the and/or following the discharge of the patient.  The suicide prevention education provided includes the following:  Suicide risk factors  Suicide prevention and interventions  National Suicide Hotline telephone number  [facility] assessment telephone number  [facility] Emergency Assistance 911  [facility] and/or Residential Mobile Crisis Unit telephone number  Request made of family/significant other to:  Remove weapons (e.g., guns, rifles, knives), all items previously/currently identified as safety concern.    Remove drugs/medications (over-the-counter, prescriptions, illicit drugs), all items previously/currently identified as a safety concern.  The family member/significant other verbalizes understanding of the suicide prevention education information provided.  The family member/significant other agrees to remove the items of safety concern listed above.  Wife's only concerns were patient's follow up appointments, she was agreeable to discharge plan.   Derek [name] 04/13/2018, 10:43 AM

## 2018-04-17 ENCOUNTER — Other Ambulatory Visit: Payer: Self-pay

## 2018-04-17 ENCOUNTER — Emergency Department (HOSPITAL_COMMUNITY)
Admission: EM | Admit: 2018-04-17 | Discharge: 2018-04-17 | Disposition: A | Discharge status: Home / Self Care | Payer: 59 | Attending: Emergency Medicine | Admitting: Emergency Medicine

## 2018-04-17 ENCOUNTER — Encounter (HOSPITAL_COMMUNITY): Payer: Self-pay | Admitting: *Deleted

## 2018-04-17 ENCOUNTER — Emergency Department (HOSPITAL_COMMUNITY)
Admission: EM | Admit: 2018-04-17 | Discharge: 2018-04-18 | Disposition: A | Discharge status: Home / Self Care | Payer: 59 | Source: Home / Self Care | Attending: Emergency Medicine | Admitting: Emergency Medicine

## 2018-04-17 DIAGNOSIS — Z79899 Other long term (current) drug therapy: Secondary | ICD-10-CM | POA: Insufficient documentation

## 2018-04-17 DIAGNOSIS — R112 Nausea with vomiting, unspecified: Secondary | ICD-10-CM | POA: Insufficient documentation

## 2018-04-17 DIAGNOSIS — F1020 Alcohol dependence, uncomplicated: Secondary | ICD-10-CM | POA: Diagnosis not present

## 2018-04-17 DIAGNOSIS — I10 Essential (primary) hypertension: Secondary | ICD-10-CM | POA: Insufficient documentation

## 2018-04-17 DIAGNOSIS — F1010 Alcohol abuse, uncomplicated: Secondary | ICD-10-CM

## 2018-04-17 DIAGNOSIS — F3289 Other specified depressive episodes: Secondary | ICD-10-CM | POA: Diagnosis not present

## 2018-04-17 DIAGNOSIS — F332 Major depressive disorder, recurrent severe without psychotic features: Secondary | ICD-10-CM | POA: Diagnosis not present

## 2018-04-17 DIAGNOSIS — F1024 Alcohol dependence with alcohol-induced mood disorder: Secondary | ICD-10-CM | POA: Diagnosis not present

## 2018-04-17 DIAGNOSIS — Z590 Homelessness: Secondary | ICD-10-CM | POA: Diagnosis not present

## 2018-04-17 LAB — CBC WITH DIFFERENTIAL/PLATELET
Abs Immature Granulocytes: 0.01 10*3/uL (ref 0.00–0.07)
BASOS ABS: 0.1 10*3/uL (ref 0.0–0.1)
Basophils Relative: 1 %
Eosinophils Absolute: 0.1 10*3/uL (ref 0.0–0.5)
Eosinophils Relative: 1 %
HEMATOCRIT: 42.1 % (ref 39.0–52.0)
Hemoglobin: 13.3 g/dL (ref 13.0–17.0)
Immature Granulocytes: 0 %
Lymphocytes Relative: 33 %
Lymphs Abs: 2.2 10*3/uL (ref 0.7–4.0)
MCH: 26.9 pg (ref 26.0–34.0)
MCHC: 31.6 g/dL (ref 30.0–36.0)
MCV: 85.1 fL (ref 80.0–100.0)
Monocytes Absolute: 0.9 10*3/uL (ref 0.1–1.0)
Monocytes Relative: 14 %
Neutro Abs: 3.3 10*3/uL (ref 1.7–7.7)
Neutrophils Relative %: 51 %
Platelets: 262 10*3/uL (ref 150–400)
RBC: 4.95 MIL/uL (ref 4.22–5.81)
RDW: 16.6 % — ABNORMAL HIGH (ref 11.5–15.5)
WBC: 6.5 10*3/uL (ref 4.0–10.5)
nRBC: 0 % (ref 0.0–0.2)

## 2018-04-17 LAB — COMPREHENSIVE METABOLIC PANEL
ALBUMIN: 4.2 g/dL (ref 3.5–5.0)
ALT: 35 U/L (ref 0–44)
ALT: 35 U/L (ref 0–44)
ANION GAP: 14 (ref 5–15)
AST: 49 U/L — ABNORMAL HIGH (ref 15–41)
AST: 51 U/L — ABNORMAL HIGH (ref 15–41)
Albumin: 3.9 g/dL (ref 3.5–5.0)
Alkaline Phosphatase: 131 U/L — ABNORMAL HIGH (ref 38–126)
Alkaline Phosphatase: 132 U/L — ABNORMAL HIGH (ref 38–126)
Anion gap: 14 (ref 5–15)
BUN: 11 mg/dL (ref 6–20)
BUN: 9 mg/dL (ref 6–20)
CALCIUM: 8.4 mg/dL — AB (ref 8.9–10.3)
CO2: 24 mmol/L (ref 22–32)
CO2: 21 mmol/L — ABNORMAL LOW (ref 22–32)
Calcium: 8.9 mg/dL (ref 8.9–10.3)
Chloride: 104 mmol/L (ref 98–111)
Chloride: 106 mmol/L (ref 98–111)
Creatinine, Ser: 0.84 mg/dL (ref 0.61–1.24)
Creatinine, Ser: 0.98 mg/dL (ref 0.61–1.24)
GFR calc Af Amer: >60 mL/min (ref >60)
GFR calc Af Amer: >60 mL/min (ref >60)
GFR calc non Af Amer: >60 mL/min (ref >60)
GFR calc non Af Amer: >60 mL/min (ref >60)
GLUCOSE: 90 mg/dL (ref 70–99)
Glucose, Bld: 88 mg/dL (ref 70–99)
Potassium: 3.7 mmol/L (ref 3.5–5.1)
Potassium: 3.4 mmol/L — ABNORMAL LOW (ref 3.5–5.1)
Sodium: 142 mmol/L (ref 135–145)
Sodium: 141 mmol/L (ref 135–145)
Total Bilirubin: 0.4 mg/dL (ref 0.3–1.2)
Total Bilirubin: 1 mg/dL (ref 0.3–1.2)
Total Protein: 6.5 g/dL (ref 6.5–8.1)
Total Protein: 6.7 g/dL (ref 6.5–8.1)

## 2018-04-17 LAB — URINALYSIS, ROUTINE W REFLEX MICROSCOPIC
BILIRUBIN URINE: NEGATIVE
Glucose, UA: NEGATIVE mg/dL
Hgb urine dipstick: NEGATIVE
Ketones, ur: NEGATIVE mg/dL
Leukocytes,Ua: NEGATIVE
Nitrite: NEGATIVE
Protein, ur: NEGATIVE mg/dL
Specific Gravity, Urine: 1.002 — ABNORMAL LOW (ref 1.005–1.030)
pH: 7 (ref 5.0–8.0)

## 2018-04-17 LAB — CBC
HCT: 40.6 % (ref 39.0–52.0)
Hemoglobin: 12.9 g/dL — ABNORMAL LOW (ref 13.0–17.0)
MCH: 26.6 pg (ref 26.0–34.0)
MCHC: 31.8 g/dL (ref 30.0–36.0)
MCV: 83.7 fL (ref 80.0–100.0)
Platelets: 284 10*3/uL (ref 150–400)
RBC: 4.85 MIL/uL (ref 4.22–5.81)
RDW: 16.5 % — ABNORMAL HIGH (ref 11.5–15.5)
WBC: 7 10*3/uL (ref 4.0–10.5)
nRBC: 0 % (ref 0.0–0.2)

## 2018-04-17 LAB — SALICYLATE LEVEL

## 2018-04-17 LAB — ETHANOL
Alcohol, Ethyl (B): 165 mg/dL — ABNORMAL HIGH (ref <10)
Alcohol, Ethyl (B): 119 mg/dL — ABNORMAL HIGH (ref <10)

## 2018-04-17 LAB — LIPASE, BLOOD: Lipase: 40 U/L (ref 11–51)

## 2018-04-17 LAB — ACETAMINOPHEN LEVEL: Acetaminophen (Tylenol), Serum: <10 ug/mL — ABNORMAL LOW (ref 10–30)

## 2018-04-17 MED ORDER — LORAZEPAM 1 MG PO TABS: 0.0000 mg | ORAL_TABLET | Freq: Two times a day (BID) | ORAL | Status: DC

## 2018-04-17 MED ORDER — DULOXETINE HCL 30 MG PO CPEP: 30.0000 mg | ORAL_CAPSULE | Freq: Every day | ORAL | Status: DC

## 2018-04-17 MED ORDER — SODIUM CHLORIDE 0.9 % IV BOLUS
1000.0000 mL | Freq: Once | INTRAVENOUS | Status: AC
  Administered 2018-04-17: 1000 mL via INTRAVENOUS

## 2018-04-17 MED ORDER — LORAZEPAM 2 MG/ML IJ SOLN
1.0000 mg | Freq: Once | INTRAMUSCULAR | Status: AC
  Administered 2018-04-17: 1 mg via INTRAVENOUS
  Filled 2018-04-17: qty 1

## 2018-04-17 MED ORDER — ACAMPROSATE CALCIUM 333 MG PO TBEC
666.0000 mg | DELAYED_RELEASE_TABLET | Freq: Three times a day (TID) | ORAL | Status: DC
  Administered 2018-04-17: 666 mg via ORAL
  Filled 2018-04-17: qty 2

## 2018-04-17 MED ORDER — LORAZEPAM 2 MG/ML IJ SOLN: 0.0000 mg | Freq: Two times a day (BID) | INTRAMUSCULAR | Status: DC

## 2018-04-17 MED ORDER — LORAZEPAM 1 MG PO TABS: 0.0000 mg | ORAL_TABLET | Freq: Four times a day (QID) | ORAL | Status: DC

## 2018-04-17 MED ORDER — PANTOPRAZOLE SODIUM 40 MG IV SOLR
40.0000 mg | Freq: Once | INTRAVENOUS | Status: AC
  Administered 2018-04-17: 40 mg via INTRAVENOUS
  Filled 2018-04-17: qty 40

## 2018-04-17 MED ORDER — VITAMIN B-1 100 MG PO TABS: 100.0000 mg | ORAL_TABLET | Freq: Every day | ORAL | Status: DC

## 2018-04-17 MED ORDER — SODIUM CHLORIDE 0.9% FLUSH
3.0000 mL | Freq: Once | INTRAVENOUS | Status: AC
  Administered 2018-04-17: 3 mL via INTRAVENOUS

## 2018-04-17 MED ORDER — THIAMINE HCL 100 MG/ML IJ SOLN: 100.0000 mg | Freq: Every day | INTRAMUSCULAR | Status: DC

## 2018-04-17 MED ORDER — ONDANSETRON 4 MG PO TBDP: 4.0000 mg | ORAL_TABLET | Freq: Once | ORAL | Status: DC | PRN

## 2018-04-17 MED ORDER — GABAPENTIN 300 MG PO CAPS: 300.0000 mg | ORAL_CAPSULE | Freq: Three times a day (TID) | ORAL | Status: DC

## 2018-04-17 MED ORDER — LORAZEPAM 2 MG/ML IJ SOLN: 0.0000 mg | Freq: Four times a day (QID) | INTRAMUSCULAR | Status: DC

## 2018-04-17 NOTE — ED Provider Notes (Signed)
[facility]-EMERGENCY DEPT Provider Note   CSN: [id number] Arrival date & time: 04/17/18  2022    History   Chief Complaint Chief Complaint  Patient presents with  . Medical Clearance  . Emesis    HPI Derek [name] is a 58 y.o. male.     58 year old male presents to the emergency department for evaluation of nausea and vomiting.  He states that he "really messed up" as he decided to leave the emergency department this morning and go drink 30 beers.  He has a history of alcohol abuse and is scheduled to enter into a 90 or 120-day program for alcohol rehab on Monday.  He has been experiencing persistent dry heaves.  States that he usually vomits very little given history of gastric bypass.  Has been having some lower abdominal cramping and discomfort.  No medications taken prior to arrival.  Reports some tremors with alcohol withdrawal, but no history of seizures.  No SI/HI expressed.   Emesis    Past Medical History:  Diagnosis Date  . Alcohol abuse   . Anxiety   . Depression   . Essential hypertension 11/08/2015  . Homelessness   . Hypertension   . Obesity (BMI 30.0-34.9) 11/08/2015  . Sinus bradycardia 11/08/2015    Patient Active Problem List   Diagnosis Date Noted  . MDD (major depressive disorder), severe (HCC) 04/09/2018  . Hyponatremia 02/24/2018  . Severe recurrent major depression w/psychotic features, mood-congruent (HCC) 01/21/2018  . 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 Major depressive disorder, recurrent episode (HCC) 12/19/2016  . Alcohol use disorder, severe, dependence (HCC) 12/19/2016  . Essential hypertension 11/08/2015  . Obesity (BMI 30.0-34.9) 11/08/2015  . Sinus bradycardia 11/08/2015    Past Surgical History:  Procedure Laterality Date  . CHOLECYSTECTOMY    . GASTRIC BYPASS     2006 - done at [facility]  . ROTATOR CUFF REPAIR  2017  . ROUX-EN-Y PROCEDURE  2007  . SHOULDER  ARTHROSCOPY WITH SUBACROMIAL DECOMPRESSION Right 04/17/2015   Procedure: SHOULDER ARTHROSCOPY ROTATOR CUFF DEBRIDEMENT WITH SUBACROMIAL DECOMPRESSION;  Surgeon: Jones Broom, MD;  Location: [facility];  Service: Orthopedics;  Laterality: Right;  . TONSILLECTOMY    . wisdom teeth extractions          Home Medications    Prior to Admission medications   Medication Sig Start Date End Date Taking? Authorizing Provider  acamprosate (CAMPRAL) 333 MG tablet Take 2 tablets (666 mg total) by mouth 3 (three) times daily. For alcohol cravings 04/13/18  Yes Aldean Baker, NP  gabapentin (NEURONTIN) 300 MG capsule Take 1 capsule (300 mg total) by mouth 3 (three) times daily. For pain 04/13/18  Yes Aldean Baker, NP  DULoxetine (CYMBALTA) 30 MG capsule Take 1 capsule (30 mg total) by mouth daily. For mood 04/14/18   Aldean Baker, NP  hydrOXYzine (ATARAX/VISTARIL) 25 MG tablet Take 1 tablet (25 mg total) by mouth at bedtime as needed and may repeat dose one time if needed (INSOMNIA). 04/13/18   Aldean Baker, NP  Multiple Vitamin (MULTIVITAMIN WITH MINERALS) TABS tablet Take 1 tablet by mouth daily. Vitamin supplement Patient not taking: Reported on 04/08/2018 01/27/18   Armandina Stammer I, NP  ondansetron (ZOFRAN ODT) 4 MG disintegrating tablet Take 1 tablet (4 mg total) by mouth every 8 (eight) hours as needed for nausea or vomiting. 04/18/18   Antony Madura, PA-C    Family History Family History  Problem Relation  Age of Onset  . Cancer Mother   . Peripheral Artery Disease Father   . Heart attack Maternal Grandfather     Social History Social History   Tobacco Use  . Smoking status: Never Smoker  . Smokeless tobacco: Never Used  Substance Use Topics  . Alcohol use: Yes    Comment: one fifth per day  . Drug use: Not Currently     Allergies   Patient has no known allergies.   Review of Systems Review of Systems  Gastrointestinal: Positive for vomiting.  Ten systems reviewed  and are negative for acute change, except as noted in the HPI.    Physical Exam Updated Vital Signs BP (!) 161/98   Pulse 69   Temp 98 F (36.7 C)   Resp 15   Ht 6\' 1"  (1.854 m)   Wt 104.3 kg   SpO2 98%   BMI 30.34 kg/m   Physical Exam Vitals signs and nursing note reviewed.  Constitutional:      General: He is not in acute distress.    Appearance: He is well-developed. He is not diaphoretic.     Comments: Nontoxic appearing and in NAD. Small amount of phlegm in emesis bag.  HENT:     Head: Normocephalic and atraumatic.  Eyes:     General: No scleral icterus.    Conjunctiva/sclera: Conjunctivae normal.  Neck:     Musculoskeletal: Normal range of motion.  Cardiovascular:     Rate and Rhythm: Normal rate and regular rhythm.     Pulses: Normal pulses.  Pulmonary:     Effort: Pulmonary effort is normal. No respiratory distress.     Comments: Respirations even and unlabored Abdominal:     Tenderness: There is abdominal tenderness.     Comments: Soft, nondistended abdomen. TTP in bilateral lower quadrants. No rigidity or peritoneal signs.  Musculoskeletal: Normal range of motion.  Skin:    General: Skin is warm and dry.     Coloration: Skin is not pale.     Findings: No erythema or rash.  Neurological:     Mental Status: He is alert and oriented to person, place, and time.  Psychiatric:        Behavior: Behavior normal.      ED Treatments / Results  Labs (all labs ordered are listed, but only abnormal results are displayed) Labs Reviewed  COMPREHENSIVE METABOLIC PANEL - Abnormal; Notable for the following components:      Result Value   Potassium 3.4 (*)    CO2 21 (*)    AST 51 (*)    Alkaline Phosphatase 132 (*)    All other components within normal limits  CBC - Abnormal; Notable for the following components:   Hemoglobin 12.9 (*)    RDW 16.5 (*)    All other components within normal limits  URINALYSIS, ROUTINE W REFLEX MICROSCOPIC - Abnormal; Notable for  the following components:   Color, Urine COLORLESS (*)    Specific Gravity, Urine 1.002 (*)    All other components within normal limits  ETHANOL - Abnormal; Notable for the following components:   Alcohol, Ethyl (B) 119 (*)    All other components within normal limits  LIPASE, BLOOD    EKG None  Radiology No results found.  Procedures Procedures (including critical care time)  Medications Ordered in ED Medications  ondansetron (ZOFRAN-ODT) disintegrating tablet 4 mg (has no administration in time range)  sodium chloride flush (NS) 0.9 % injection 3 mL (3 mLs  Intravenous Given 04/17/18 2228)  LORazepam (ATIVAN) injection 1 mg (1 mg Intravenous Given 04/17/18 2228)  sodium chloride 0.9 % bolus 1,000 mL (0 mLs Intravenous Stopped 04/17/18 2333)  pantoprazole (PROTONIX) injection 40 mg (40 mg Intravenous Given 04/17/18 2228)  LORazepam (ATIVAN) tablet 1 mg (1 mg Oral Given 04/18/18 0300)    1:05 AM Patient resting comfortably. No vomiting. Has an empty cup at bedside indicating that he has tolerated some PO fluids. Will give crackers for additional PO challenge. Labs largely unchanged from prior evaluation.  5:00 AM The patient has tolerated fluids and a sandwich without difficulty.  Vitals stable.   Initial Impression / Assessment and Plan / ED Course  I have reviewed the triage vital signs and the nursing notes.  Pertinent labs & imaging results that were available during my care of the patient were reviewed by me and considered in my medical decision making (see chart for details).        58 year old male presents to the ED approximately 12 hours since last evaluation for complaints of nausea and vomiting.  States that he has been drinking excessively since discharge.  Has a longstanding history of alcohol abuse.  Is scheduled to enter into a detox/rehab program on Monday.  No SI/HI.  Complaining of some nausea with dry heaves on arrival.  No emesis noted on initial or  repeat assessment.  His labs today are stable compared to less than 24 hours prior.  Symptoms have improved with IV fluids and Ativan.  He is now tolerating oral fluids as well as a sandwich.  Plan for discharge with Zofran.  Return precautions discussed and provided. Patient discharged in stable condition with no unaddressed concerns.   Final Clinical Impressions(s) / ED Diagnoses   Final diagnoses:  Non-intractable vomiting with nausea, unspecified vomiting type    ED Discharge Orders         Ordered    ondansetron (ZOFRAN ODT) 4 MG disintegrating tablet  Every 8 hours PRN     04/18/18 0522           Antony Madura, PA-C 04/18/18 0549    Benjiman Core, MD 04/18/18 1531

## 2018-04-17 NOTE — ED Notes (Signed)
Bed: [facility]2 Expected date:  Expected time:  Means of arrival:  Comments: 

## 2018-04-17 NOTE — ED Provider Notes (Signed)
[facility]-EMERGENCY DEPT Provider Note   CSN: [id number] Arrival date & time: 04/17/18  0320    History   Chief Complaint Chief Complaint  Patient presents with  . Detox  . Homeless    HPI Derek [name] is a 58 y.o. male.     Patient presents to the ED with chief complaint of alcoholism.  States that he drinks about a case of beer per day.  States that he needs to change and wants to stop drinking.  He would like to be placed in an inpatient rehabilitation center in [place].  He is pleading for our help in getting him there.  Denies having alcohol withdrawal seizures.  Denies any drug use.  Denies any other associated symptoms.  Denies homicidal or suicidal ideations.  The history is provided by the patient. No language interpreter was used.    Past Medical History:  Diagnosis Date  . Alcohol abuse   . Anxiety   . Depression   . Essential hypertension 11/08/2015  . Homelessness   . Hypertension   . Obesity (BMI 30.0-34.9) 11/08/2015  . Sinus bradycardia 11/08/2015    Patient Active Problem List   Diagnosis Date Noted  . MDD (major depressive disorder), severe (HCC) 04/09/2018  . Hyponatremia 02/24/2018  . Severe recurrent major depression w/psychotic features, mood-congruent (HCC) 01/21/2018  . 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 Major depressive disorder, recurrent episode (HCC) 12/19/2016  . Alcohol use disorder, severe, dependence (HCC) 12/19/2016  . Essential hypertension 11/08/2015  . Obesity (BMI 30.0-34.9) 11/08/2015  . Sinus bradycardia 11/08/2015    Past Surgical History:  Procedure Laterality Date  . CHOLECYSTECTOMY    . GASTRIC BYPASS     2006 - done at [facility]  . ROTATOR CUFF REPAIR  2017  . ROUX-EN-Y PROCEDURE  2007  . SHOULDER ARTHROSCOPY WITH SUBACROMIAL DECOMPRESSION Right 04/17/2015   Procedure: SHOULDER ARTHROSCOPY ROTATOR CUFF DEBRIDEMENT WITH SUBACROMIAL DECOMPRESSION;   Surgeon: Jones Broom, MD;  Location: [facility];  Service: Orthopedics;  Laterality: Right;  . TONSILLECTOMY    . wisdom teeth extractions          Home Medications    Prior to Admission medications   Medication Sig Start Date End Date Taking? Authorizing Provider  acamprosate (CAMPRAL) 333 MG tablet Take 2 tablets (666 mg total) by mouth 3 (three) times daily. For alcohol cravings 04/13/18   Aldean Baker, NP  DULoxetine (CYMBALTA) 30 MG capsule Take 1 capsule (30 mg total) by mouth daily. For mood 04/14/18   Aldean Baker, NP  gabapentin (NEURONTIN) 300 MG capsule Take 1 capsule (300 mg total) by mouth 3 (three) times daily. For pain 04/13/18   Aldean Baker, NP  hydrOXYzine (ATARAX/VISTARIL) 25 MG tablet Take 1 tablet (25 mg total) by mouth at bedtime as needed and may repeat dose one time if needed (INSOMNIA). 04/13/18   Aldean Baker, NP  Multiple Vitamin (MULTIVITAMIN WITH MINERALS) TABS tablet Take 1 tablet by mouth daily. Vitamin supplement Patient not taking: Reported on 04/08/2018 01/27/18   Sanjuana Kava, NP    Family History Family History  Problem Relation Age of Onset  . Cancer Mother   . Peripheral Artery Disease Father   . Heart attack Maternal Grandfather     Social History Social History   Tobacco Use  . Smoking status: Never Smoker  . Smokeless tobacco: Never Used  Substance Use Topics  . Alcohol use: Yes  Comment: one fifth per day  . Drug use: Not Currently     Allergies   Patient has no known allergies.   Review of Systems Review of Systems  All other systems reviewed and are negative.    Physical Exam Updated Vital Signs BP (!) 141/94 (BP Location: Right Arm)   Pulse 80   Temp 98.4 F (36.9 C) (Oral)   Resp 18   Ht 6\' 1"  (1.854 m)   Wt 104.3 kg   SpO2 100%   BMI 30.34 kg/m   Physical Exam Vitals signs and nursing note reviewed.  Constitutional:      Appearance: He is well-developed.  HENT:     Head:  Normocephalic and atraumatic.  Eyes:     General: No scleral icterus.       Right eye: No discharge.        Left eye: No discharge.     Conjunctiva/sclera: Conjunctivae normal.     Pupils: Pupils are equal, round, and reactive to light.  Neck:     Musculoskeletal: Normal range of motion and neck supple.     Vascular: No JVD.  Cardiovascular:     Rate and Rhythm: Normal rate and regular rhythm.     Heart sounds: Normal heart sounds. No murmur. No friction rub. No gallop.   Pulmonary:     Effort: Pulmonary effort is normal. No respiratory distress.     Breath sounds: Normal breath sounds. No wheezing or rales.  Chest:     Chest wall: No tenderness.  Abdominal:     General: There is no distension.     Palpations: Abdomen is soft. There is no mass.     Tenderness: There is no abdominal tenderness. There is no guarding or rebound.  Musculoskeletal: Normal range of motion.        General: No tenderness.  Skin:    General: Skin is warm and dry.  Neurological:     Mental Status: He is alert and oriented to person, place, and time.  Psychiatric:        Behavior: Behavior normal.        Thought Content: Thought content normal.        Judgment: Judgment normal.      ED Treatments / Results  Labs (all labs ordered are listed, but only abnormal results are displayed) Labs Reviewed - No data to display  EKG None  Radiology No results found.  Procedures Procedures (including critical care time)  Medications Ordered in ED Medications  LORazepam (ATIVAN) injection 0-4 mg (has no administration in time range)    Or  LORazepam (ATIVAN) tablet 0-4 mg (has no administration in time range)  LORazepam (ATIVAN) injection 0-4 mg (has no administration in time range)    Or  LORazepam (ATIVAN) tablet 0-4 mg (has no administration in time range)  thiamine (VITAMIN B-1) tablet 100 mg (has no administration in time range)    Or  thiamine (B-1) injection 100 mg (has no administration in  time range)     Initial Impression / Assessment and Plan / ED Course  I have reviewed the triage vital signs and the nursing notes.  Pertinent labs & imaging results that were available during my care of the patient were reviewed by me and considered in my medical decision making (see chart for details).        Patient requesting detox in [place]  Pleading for help today.  Will consult TTS to see if anything a can be  done.  Medically clear for TTS evaluation.  Final Clinical Impressions(s) / ED Diagnoses   Final diagnoses:  None    ED Discharge Orders    None       Roxy Horseman, PA-C 04/17/18 0458    Ward, Layla Maw, DO 04/17/18 [phone]

## 2018-04-17 NOTE — ED Notes (Signed)
Bed: [facility] Expected date:  Expected time:  Means of arrival:  Comments: 

## 2018-04-17 NOTE — ED Triage Notes (Signed)
Pt requesting detox from [facility].  Pt stated "I drank a case of beer yesterday."

## 2018-04-17 NOTE — ED Notes (Signed)
Bed: WTR8 Expected date:  Expected time:  Means of arrival:  Comments: 

## 2018-04-17 NOTE — ED Notes (Signed)
Urine culture sent to the lab. 

## 2018-04-17 NOTE — Discharge Instructions (Addendum)
You have been seen today for alcohol abuse. Please read and follow all provided instructions.   1. Medications: usual home medications 2. Treatment: rest, drink plenty of fluids 3. Follow Up: Please follow up with your primary doctor in 2 days for discussion of your diagnoses and further evaluation after today's visit; if you do not have a primary care doctor use the resource guide provided to find one; Please return to the ER for any new or worsening symptoms. Please obtain all of your results from medical records or have your doctors office obtain the results - share them with your doctor - you should be seen at your doctors office. Call today to arrange your follow up.   Take medications as prescribed. Please review all of the medicines and only take them if you do not have an allergy to them. Return to the emergency room for worsening condition or new concerning symptoms. Follow up with your regular doctor. If you don't have a regular doctor use one of the numbers below to establish a primary care doctor.  Please be aware that if you are taking birth control pills, taking other prescriptions, ESPECIALLY ANTIBIOTICS may make the birth control ineffective - if this is the case, either do not engage in sexual activity or use alternative methods of birth control such as condoms until you have finished the medicine and your family doctor says it is OK to restart them. If you are on a blood thinner such as COUMADIN, be aware that any other medicine that you take may cause the coumadin to either work too much, or not enough - you should have your coumadin level rechecked in next 7 days if this is the case.  ?  It is also a possibility that you have an allergic reaction to any of the medicines that you have been prescribed - Everybody reacts differently to medications and while MOST people have no trouble with most medicines, you may have a reaction such as nausea, vomiting, rash, swelling, shortness of  breath. If this is the case, please stop taking the medicine immediately and contact your physician.  ?  You should return to the ER if you develop severe or worsening symptoms.   Emergency Department Resource Guide 1) Find a Doctor and Pay Out of Pocket Although you won't have to find out who is covered by your insurance plan, it is a good idea to ask around and get recommendations. You will then need to call the office and see if the doctor you have chosen will accept you as a new patient and what types of options they offer for patients who are self-pay. Some doctors offer discounts or will set up payment plans for their patients who do not have insurance, but you will need to ask so you aren't surprised when you get to your appointment.  2) Contact Your Local Health Department Not all health departments have doctors that can see patients for sick visits, but many do, so it is worth a call to see if yours does. If you don't know where your local health department is, you can check in your phone book. The CDC also has a tool to help you locate your state's health department, and many state websites also have listings of all of their local health departments.  3) Find a Walk-in Clinic If your illness is not likely to be very severe or complicated, you may want to try a walk in clinic. These are popping up all over  the country in pharmacies, drugstores, and shopping centers. They're usually staffed by nurse practitioners or physician assistants that have been trained to treat common illnesses and complaints. They're usually fairly quick and inexpensive. However, if you have serious medical issues or chronic medical problems, these are probably not your best option.  No Primary Care Doctor: Call Health Connect at  [phone] - they can help you locate a primary care doctor that  accepts your insurance, provides certain services, etc. Physician Referral [phone]  Emergency Department  Resource Guide 1) Find a Doctor and Pay Out of Pocket Although you won't have to find out who is covered by your insurance plan, it is a good idea to ask around and get recommendations. You will then need to call the office and see if the doctor you have chosen will accept you as a new patient and what types of options they offer for patients who are self-pay. Some doctors offer discounts or will set up payment plans for their patients who do not have insurance, but you will need to ask so you aren't surprised when you get to your appointment.  2) Contact Your Local Health Department Not all health departments have doctors that can see patients for sick visits, but many do, so it is worth a call to see if yours does. If you don't know where your local health department is, you can check in your phone book. The CDC also has a tool to help you locate your state's health department, and many state websites also have listings of all of their local health departments.  3) Find a Walk-in Clinic If your illness is not likely to be very severe or complicated, you may want to try a walk in clinic. These are popping up all over the country in pharmacies, drugstores, and shopping centers. They're usually staffed by nurse practitioners or physician assistants that have been trained to treat common illnesses and complaints. They're usually fairly quick and inexpensive. However, if you have serious medical issues or chronic medical problems, these are probably not your best option.  No Primary Care Doctor: Call Health Connect at  [phone] - they can help you locate a primary care doctor that  accepts your insurance, provides certain services, etc. Physician Referral Service- [phone]  Chronic Pain Problems: Organization         Address  Phone   Notes  [facility]  [phone] Patients need to be referred by their primary care doctor.   Medication Assistance: Organization          Address  Phone   Notes  [facility] Med[facility] [address] [place], [place] [address] [phone] --Must be a resident of [facility] -- Must have NO insurance coverage whatsoever (no Medicaid/ Medicare, etc.) -- The pt. MUST have a primary care doctor that directs their care regularly and follows them in the community   [facility]  [phone]   [employer]  [phone]    Agencies that provide inexpensive medical care: Organization         Address  Phone   Notes  [facility]  [phone]   [facility]    [phone]   [facility] [address] [place], [place] [address] [phone]   [facility] [address] [place], [place] [phone]   [facility]    [phone]   [facility]    [phone]   °[facility] ° [address], [place] Phone:  [phone], Fax:  [phone] Hours of Operation:  9 am - 6 pm, M-F.  Also accepts Medicaid/Medicare and self-pay.  °[facility] ° [address], [place] Phone: [phone], Fax: [phone]. Hours of Operation:  8:30 am - 5:30 pm, M-F.  Also accepts Medicaid and self-pay.  °[facility] [place] [address], [place] Phone: [phone]   °[facility] [address], [place], [place] [phone], Ext. [phone] Mondays & Thursdays: 7-9 AM.  First 15 patients are seen on a first come, first serve basis. °  ° °Medicaid-accepting [place] Providers: ° °Organization         Address  Phone   Notes  °[facility] [address], [place] [phone] Also accepts self-pay patients.  °[facility] [address], [place] ° [phone]   °[facility] [address], [place] [phone]   °[facility] [address] [place] [address], [place] [phone]   °[facility] [address], [place]  ° [phone] Only accepts [place] Access Medicaid patients after they have their name applied to their card.  ° °Self-Pay (no insurance) in [place]: ° °Organization         Address  Phone   Notes  °Sickle Cell Patients, [facility] [address], [place] [phone]   °[facility] [address], [place] [phone]   °[facility] ° [address],  [phone]   °[facility]/Dr. Osei-Bonsu ° [address] [place] [address], [place] or [address], [place] [phone] Phone number for both [place] and [place] locations is the same.  °[facility] [address], [place] [phone]   °[facility]  [address] [place] [address],  or [address] [phone] °[phone]   °[facility] [address],  [phone], phone; [phone], fax Sees patients 1st and 3rd Saturday of every month.  Must not qualify for public or private insurance (i.e. Medicaid, Medicare, [place] Health Choice, Veterans' Benefits)  Household income should be no more than 200% of the poverty level The clinic cannot treat you if you are pregnant or think you are pregnant  Sexually transmitted diseases are not treated at the clinic.  °  ° °

## 2018-04-17 NOTE — ED Provider Notes (Addendum)
Care assumed from [employer], [place].  Please see his full H&P.  In short,  Derek [name] is a 58 y.o. male presents for alcohol abuse. Patient is requesting assistance with inpatient rehabilitation center in [place]. Labs are pending. TTS consult placed.  Labs reveal elevated ethanol at 165. No other acute abnormalities noted. Patient is medically cleared for TTS evaluation. TTS evaluated patient and has recommended peer support be consulted. Will consult peer support. Peer support will contact patient as outpatient. Patient is stable and will be discharged. Patient is able to eat, drink fluids, and ambulate without difficulty. Provided resources. Discussed return precautions with patient. Patient states he agrees and understands plan.     Carlyle Basques [place], [place] 04/17/18 6834    Ward, Layla Maw, DO 04/18/18 1316

## 2018-04-17 NOTE — ED Notes (Signed)
Lab called at this time to run specimens.  

## 2018-04-17 NOTE — BHH Counselor (Signed)
Clinician called Hardie Lora, RN to see if there was an available room to complete pt's assessment. Clinician noted from RN there are no available rooms. Clinician expressed pt will be seen during day shift. Clinician attempted to call Rob, PA-C. Sam, NT to follow up with  Rob, PA-C.   Redmond Pulling, MS, [facility], [facility] Triage Specialist [phone]

## 2018-04-17 NOTE — ED Notes (Signed)
Pt went to bathroom, but did not give a sample. Pt was made aware that next time we do need a sample.

## 2018-04-17 NOTE — ED Triage Notes (Signed)
Pt reports he was just seen here for same early this am around 0400.  He reports nausea and vomiting.  He states he applied at [facility] recovery center Monday.  He is actively vomiting in triage.

## 2018-04-18 ENCOUNTER — Emergency Department (HOSPITAL_COMMUNITY)
Admission: EM | Admit: 2018-04-18 | Discharge: 2018-04-19 | Disposition: A | Discharge status: Home / Self Care | Payer: 59 | Attending: Emergency Medicine | Admitting: Emergency Medicine

## 2018-04-18 ENCOUNTER — Encounter (HOSPITAL_COMMUNITY): Payer: Self-pay

## 2018-04-18 ENCOUNTER — Other Ambulatory Visit: Payer: Self-pay

## 2018-04-18 DIAGNOSIS — F1010 Alcohol abuse, uncomplicated: Secondary | ICD-10-CM | POA: Diagnosis not present

## 2018-04-18 DIAGNOSIS — Z79899 Other long term (current) drug therapy: Secondary | ICD-10-CM | POA: Insufficient documentation

## 2018-04-18 DIAGNOSIS — Z9884 Bariatric surgery status: Secondary | ICD-10-CM | POA: Diagnosis not present

## 2018-04-18 DIAGNOSIS — I10 Essential (primary) hypertension: Secondary | ICD-10-CM | POA: Diagnosis not present

## 2018-04-18 DIAGNOSIS — R45851 Suicidal ideations: Secondary | ICD-10-CM | POA: Diagnosis not present

## 2018-04-18 DIAGNOSIS — Z590 Homelessness: Secondary | ICD-10-CM | POA: Diagnosis not present

## 2018-04-18 DIAGNOSIS — F332 Major depressive disorder, recurrent severe without psychotic features: Secondary | ICD-10-CM | POA: Insufficient documentation

## 2018-04-18 DIAGNOSIS — F1024 Alcohol dependence with alcohol-induced mood disorder: Secondary | ICD-10-CM | POA: Diagnosis present

## 2018-04-18 DIAGNOSIS — F3289 Other specified depressive episodes: Secondary | ICD-10-CM

## 2018-04-18 DIAGNOSIS — Z008 Encounter for other general examination: Secondary | ICD-10-CM | POA: Diagnosis present

## 2018-04-18 HISTORY — DX: Suicidal ideations: R45.851

## 2018-04-18 LAB — CBC WITH DIFFERENTIAL/PLATELET
Abs Immature Granulocytes: 0.01 10*3/uL (ref 0.00–0.07)
Basophils Absolute: 0 10*3/uL (ref 0.0–0.1)
Basophils Relative: 1 %
Eosinophils Absolute: 0.1 10*3/uL (ref 0.0–0.5)
Eosinophils Relative: 1 %
HCT: 37.6 % — ABNORMAL LOW (ref 39.0–52.0)
Hemoglobin: 12.2 g/dL — ABNORMAL LOW (ref 13.0–17.0)
Immature Granulocytes: 0 %
Lymphocytes Relative: 39 %
Lymphs Abs: 2.2 10*3/uL (ref 0.7–4.0)
MCH: 27 pg (ref 26.0–34.0)
MCHC: 32.4 g/dL (ref 30.0–36.0)
MCV: 83.2 fL (ref 80.0–100.0)
Monocytes Absolute: 0.7 10*3/uL (ref 0.1–1.0)
Monocytes Relative: 12 %
Neutro Abs: 2.7 10*3/uL (ref 1.7–7.7)
Neutrophils Relative %: 47 %
Platelets: 231 10*3/uL (ref 150–400)
RBC: 4.52 MIL/uL (ref 4.22–5.81)
RDW: 16.2 % — ABNORMAL HIGH (ref 11.5–15.5)
WBC: 5.7 10*3/uL (ref 4.0–10.5)
nRBC: 0 % (ref 0.0–0.2)

## 2018-04-18 LAB — RAPID URINE DRUG SCREEN, HOSP PERFORMED
Amphetamines: NOT DETECTED
Barbiturates: NOT DETECTED
Benzodiazepines: NOT DETECTED
Cocaine: NOT DETECTED
Opiates: NOT DETECTED
Tetrahydrocannabinol: NOT DETECTED

## 2018-04-18 LAB — COMPREHENSIVE METABOLIC PANEL
ALT: 34 U/L (ref 0–44)
AST: 46 U/L — ABNORMAL HIGH (ref 15–41)
Albumin: 4.1 g/dL (ref 3.5–5.0)
Alkaline Phosphatase: 123 U/L (ref 38–126)
Anion gap: 11 (ref 5–15)
BUN: 7 mg/dL (ref 6–20)
CALCIUM: 8.8 mg/dL — AB (ref 8.9–10.3)
CO2: 22 mmol/L (ref 22–32)
Chloride: 105 mmol/L (ref 98–111)
Creatinine, Ser: 0.81 mg/dL (ref 0.61–1.24)
GFR calc Af Amer: >60 mL/min (ref >60)
GFR calc non Af Amer: >60 mL/min (ref >60)
Glucose, Bld: 91 mg/dL (ref 70–99)
Potassium: 3.7 mmol/L (ref 3.5–5.1)
Sodium: 138 mmol/L (ref 135–145)
Total Bilirubin: 0.8 mg/dL (ref 0.3–1.2)
Total Protein: 6.6 g/dL (ref 6.5–8.1)

## 2018-04-18 LAB — ETHANOL: Alcohol, Ethyl (B): 243 mg/dL — ABNORMAL HIGH (ref <10)

## 2018-04-18 MED ORDER — THIAMINE HCL 100 MG/ML IJ SOLN: 100.0000 mg | Freq: Every day | INTRAMUSCULAR | Status: DC

## 2018-04-18 MED ORDER — LORAZEPAM 2 MG/ML IJ SOLN: 0.0000 mg | Freq: Two times a day (BID) | INTRAMUSCULAR | Status: DC

## 2018-04-18 MED ORDER — LORAZEPAM 1 MG PO TABS
0.0000 mg | ORAL_TABLET | Freq: Four times a day (QID) | ORAL | Status: DC
  Administered 2018-04-18 – 2018-04-19 (×3): 1 mg via ORAL
  Administered 2018-04-19: 2 mg via ORAL
  Filled 2018-04-18 (×3): qty 1
  Filled 2018-04-18: qty 2

## 2018-04-18 MED ORDER — LORAZEPAM 1 MG PO TABS: 0.0000 mg | ORAL_TABLET | Freq: Two times a day (BID) | ORAL | Status: DC

## 2018-04-18 MED ORDER — ONDANSETRON 4 MG PO TBDP: 4.0000 mg | ORAL_TABLET | Freq: Three times a day (TID) | ORAL | 0 refills | Status: AC | PRN

## 2018-04-18 MED ORDER — VITAMIN B-1 100 MG PO TABS
100.0000 mg | ORAL_TABLET | Freq: Every day | ORAL | Status: DC
  Administered 2018-04-18 – 2018-04-19 (×2): 100 mg via ORAL
  Filled 2018-04-18 (×2): qty 1

## 2018-04-18 MED ORDER — LORAZEPAM 1 MG PO TABS
1.0000 mg | ORAL_TABLET | Freq: Once | ORAL | Status: AC
  Administered 2018-04-18: 1 mg via ORAL
  Filled 2018-04-18: qty 1

## 2018-04-18 MED ORDER — LORAZEPAM 2 MG/ML IJ SOLN: 0.0000 mg | Freq: Four times a day (QID) | INTRAMUSCULAR | Status: DC

## 2018-04-18 NOTE — BH Assessment (Signed)
BHH Assessment Progress Note  Case was staffed with Parks FNP who recommended patient be observed and monitored for safety.    

## 2018-04-18 NOTE — ED Triage Notes (Signed)
Pt admits to SI and drinking today. Denies HI. I want to die. Jump in a lake. TTS Theodoro Grist and EDP Cur[place] present during triage

## 2018-04-18 NOTE — ED Notes (Signed)
Pt alert and oriented. Pt denies any pain. Pt c/o of si without a plan at this time. Pt denies any hi or avh. Pt resting quietly in the bed. Pt contract to safety. Pt safe will continue to monitor.

## 2018-04-18 NOTE — ED Provider Notes (Addendum)
[facility]-EMERGENCY DEPT Provider Note   CSN: [id number] Arrival date & time: 04/18/18  1638    History   Chief Complaint Chief Complaint  Patient presents with  . Suicidal  . Alcohol Problem    HPI Derek [name] is a 58 y.o. male.     The history is provided by the patient.  Mental Health Problem  Presenting symptoms: suicidal thoughts   Degree of incapacity (severity):  Mild Onset quality:  Gradual Timing:  Intermittent Progression:  Waxing and waning Chronicity:  New Context: alcohol use   Treatment compliance:  Untreated Relieved by:  Nothing Worsened by:  Family interactions and alcohol Associated symptoms: feelings of worthlessness and poor judgment   Associated symptoms: no abdominal pain and no chest pain   Risk factors: hx of mental illness     Past Medical History:  Diagnosis Date  . Alcohol abuse   . Anxiety   . Depression   . Essential hypertension 11/08/2015  . Homelessness   . Hypertension   . Obesity (BMI 30.0-34.9) 11/08/2015  . Sinus bradycardia 11/08/2015  . Suicidal ideation     Patient Active Problem List   Diagnosis Date Noted  . MDD (major depressive disorder), severe (HCC) 04/09/2018  . Hyponatremia 02/24/2018  . Severe recurrent major depression w/psychotic features, mood-congruent (HCC) 01/21/2018  . 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 Major depressive disorder, recurrent episode (HCC) 12/19/2016  . Alcohol use disorder, severe, dependence (HCC) 12/19/2016  . Essential hypertension 11/08/2015  . Obesity (BMI 30.0-34.9) 11/08/2015  . Sinus bradycardia 11/08/2015    Past Surgical History:  Procedure Laterality Date  . CHOLECYSTECTOMY    . GASTRIC BYPASS     2006 - done at [facility]  . ROTATOR CUFF REPAIR  2017  . ROUX-EN-Y PROCEDURE  2007  . SHOULDER ARTHROSCOPY WITH SUBACROMIAL DECOMPRESSION Right 04/17/2015   Procedure: SHOULDER ARTHROSCOPY ROTATOR CUFF DEBRIDEMENT  WITH SUBACROMIAL DECOMPRESSION;  Surgeon: Jones Broom, MD;  Location: [facility];  Service: Orthopedics;  Laterality: Right;  . TONSILLECTOMY    . wisdom teeth extractions          Home Medications    Prior to Admission medications   Medication Sig Start Date End Date Taking? Authorizing Provider  acamprosate (CAMPRAL) 333 MG tablet Take 2 tablets (666 mg total) by mouth 3 (three) times daily. For alcohol cravings 04/13/18   Aldean Baker, NP  DULoxetine (CYMBALTA) 30 MG capsule Take 1 capsule (30 mg total) by mouth daily. For mood 04/14/18   Aldean Baker, NP  gabapentin (NEURONTIN) 300 MG capsule Take 1 capsule (300 mg total) by mouth 3 (three) times daily. For pain 04/13/18   Aldean Baker, NP  hydrOXYzine (ATARAX/VISTARIL) 25 MG tablet Take 1 tablet (25 mg total) by mouth at bedtime as needed and may repeat dose one time if needed (INSOMNIA). 04/13/18   Aldean Baker, NP  Multiple Vitamin (MULTIVITAMIN WITH MINERALS) TABS tablet Take 1 tablet by mouth daily. Vitamin supplement Patient not taking: Reported on 04/08/2018 01/27/18   Armandina Stammer I, NP  ondansetron (ZOFRAN ODT) 4 MG disintegrating tablet Take 1 tablet (4 mg total) by mouth every 8 (eight) hours as needed for nausea or vomiting. 04/18/18   Antony Madura, PA-C    Family History Family History  Problem Relation Age of Onset  . Cancer Mother   . Peripheral Artery Disease Father   . Heart attack Maternal Grandfather  Social History Social History   Tobacco Use  . Smoking status: Never Smoker  . Smokeless tobacco: Never Used  Substance Use Topics  . Alcohol use: Yes    Comment: one fifth per day  . Drug use: Not Currently     Allergies   Patient has no known allergies.   Review of Systems Review of Systems  Constitutional: Negative for chills and fever.  HENT: Negative for ear pain and sore throat.   Eyes: Negative for pain and visual disturbance.  Respiratory: Negative for cough and  shortness of breath.   Cardiovascular: Negative for chest pain and palpitations.  Gastrointestinal: Negative for abdominal pain and vomiting.  Genitourinary: Negative for dysuria and hematuria.  Musculoskeletal: Negative for arthralgias and back pain.  Skin: Negative for color change and rash.  Neurological: Negative for seizures and syncope.  Psychiatric/Behavioral: Positive for suicidal ideas.  All other systems reviewed and are negative.    Physical Exam Updated Vital Signs  ED Triage Vitals [04/18/18 1717]  Enc Vitals Group     BP (!) 177/114     Pulse Rate 66     Resp 20     Temp 98.2 F (36.8 C)     Temp Source Oral     SpO2 97 %     Weight 229 lb 15 oz (104.3 kg)     Height 6\' 1"  (1.854 m)     Head Circumference      Peak Flow      Pain Score 0     Pain Loc      Pain Edu?      Excl. in GC?     Physical Exam Vitals signs and nursing note reviewed.  Constitutional:      Appearance: He is well-developed.  HENT:     Head: Normocephalic and atraumatic.  Eyes:     Extraocular Movements: Extraocular movements intact.     Conjunctiva/sclera: Conjunctivae normal.     Pupils: Pupils are equal, round, and reactive to light.  Neck:     Musculoskeletal: Neck supple.  Cardiovascular:     Rate and Rhythm: Normal rate and regular rhythm.     Pulses: Normal pulses.     Heart sounds: Normal heart sounds. No murmur.  Pulmonary:     Effort: Pulmonary effort is normal. No respiratory distress.     Breath sounds: Normal breath sounds.  Abdominal:     Palpations: Abdomen is soft.     Tenderness: There is no abdominal tenderness.  Skin:    General: Skin is warm and dry.  Neurological:     Mental Status: He is alert.  Psychiatric:        Mood and Affect: Mood is depressed.        Thought Content: Thought content includes suicidal ideation. Thought content does not include homicidal ideation. Thought content does not include homicidal or suicidal plan.        Judgment:  Judgment is impulsive.      ED Treatments / Results  Labs (all labs ordered are listed, but only abnormal results are displayed) Labs Reviewed  COMPREHENSIVE METABOLIC PANEL  ETHANOL  RAPID URINE DRUG SCREEN, HOSP PERFORMED  CBC WITH DIFFERENTIAL/PLATELET    EKG None  Radiology No results found.  Procedures Procedures (including critical care time)  Medications Ordered in ED Medications  LORazepam (ATIVAN) injection 0-4 mg (has no administration in time range)    Or  LORazepam (ATIVAN) tablet 0-4 mg (has no administration in time  range)  LORazepam (ATIVAN) injection 0-4 mg (has no administration in time range)    Or  LORazepam (ATIVAN) tablet 0-4 mg (has no administration in time range)  thiamine (VITAMIN B-1) tablet 100 mg (has no administration in time range)    Or  thiamine (B-1) injection 100 mg (has no administration in time range)     Initial Impression / Assessment and Plan / ED Course  I have reviewed the triage vital signs and the nursing notes.  Pertinent labs & imaging results that were available during my care of the patient were reviewed by me and considered in my medical decision making (see chart for details).     Derek [name] is a 58 year old male with history of alcohol abuse who presents to the ED with suicidal ideation.  Patient states that he wants to jump into a lake and drown himself.  He states that he is supposed to go to rehab on Monday but he states that it will not work.  He states that he is an alcoholic.  He has a wife and 2 kids and he has been living behind a movie theater getting drunk.  He states he is too embarrassed to be at home with his family.  All he wants to do is drink alcohol and he does not think that rehab will ever work for him.  He states that he has not had any alcohol today.  Clinically he appears sober.  He is very tearful on exam.  He states that he just wants help and that right now he needs mental health help and help  with alcohol.  Basic labs overall unremarkable.  Mental health provider recommends that we keep patient for overnight observation have peer support evaluate the patient as well.  They will make final recommendations about possible mental health evaluation after we find out if patient is able to get to rehab sooner or not. CIWA protocol in place. Patient voluntarily here. Mostly it appears patient issues with ETOH.   This chart was dictated using voice recognition software.  Despite best efforts to proofread,  errors can occur which can change the documentation meaning.    Final Clinical Impressions(s) / ED Diagnoses   Final diagnoses:  Alcohol abuse  Suicidal ideation    ED Discharge Orders    None       Virgina Norfolk, DO 04/18/18 1728    Virgina Norfolk, DO 05/28/18 [phone]

## 2018-04-18 NOTE — ED Notes (Signed)
Bed: WBH35 Expected date:  Expected time:  Means of arrival:  Comments: 

## 2018-04-18 NOTE — ED Notes (Signed)
Pt able to tolerate oral fluids and a [place] sandwich without any nausea

## 2018-04-18 NOTE — Discharge Instructions (Addendum)
Continue use of Zofran as needed for persistent nausea and vomiting.  Try to monitor your alcohol intake as this will aggravate your stomach and may cause your symptoms to recur.  Continue follow-up with your detox/rehab facility on Monday as scheduled.

## 2018-04-18 NOTE — BH Assessment (Addendum)
Assessment Note  Derek [name] is an 58 y.o. male that presents this date with S/I. Patient reports a plan to "jump into some water." Patient denies any H/I or AVH. Patient is vague in reference to intent or cannot identify any specific body of water. Patient was [facility] and [facility] the   evaluation of nausea and vomiting. He states that he "really screwed up" as he decided to leave the emergency department yesterday and go drink 30 beers. He has a history of alcohol abuse and is scheduled to enter into a 90 day program for alcohol rehabilitation on Monday 04/20/18. Patient states he is currently homeless and "has no place to go" until then. Patient does not appear to be impaired this date reporting last use was on 04/17/18 when he consumed "over 30 beers." BAL and UDS pending this date. Patient presents with a tearful affect and speaks in a low soft voice. Patient is oriented x4 and makes fair eye contact. Patient states that "even if he goes to a rehabilitation center it won't work." Clinically he appears sober. Patient states that he just wants help and that right now he needs mental health help although cannot identify any specific needs or symptoms. Patient as mentioned above [facility] after being admitted inpatient for 5 days and failed to follow up with OP resources. Patient stated he later relapsed that day due to "feeling useless." Patient reports that he has consumed alcohol "off and on" for over 7 years with daily use for the last year. Patient reports using various amounts with last use yesterday when he reports he consumed "close to 30 beers." He denies a history of delirium tremens, seizures from withdrawal, or confusion. Patient is not currently seeing a therapist, although he had been receiving services from TaylorKaur MD who assisted with medication management for symptoms of depression. It is unclear when the patient last  saw that provider. Patient states he is not currently taking his medication due to financial difficulties. Patient reports ongoing symptoms to include feeling useless, fatigue and isolating. Patient declined having anyone contacted in reference to obtaining collateral information. Case was staffed with Arville CareParks FNP who recommended patient be observed and monitored for safety.   Diagnosis: F33.2 MDD recurrent without psychotic symptoms, severe, Alcohol abuse   Past Medical History:  Past Medical History:  Diagnosis Date  . Alcohol abuse   . Anxiety   . Depression   . Essential hypertension 11/08/2015  . Homelessness   . Hypertension   . Obesity (BMI 30.0-34.9) 11/08/2015  . Sinus bradycardia 11/08/2015  . Suicidal ideation     Past Surgical History:  Procedure Laterality Date  . CHOLECYSTECTOMY    . GA[facility]IC BYPASS     [facility]  . ROTATOR CUFF REPAIR  2017  . ROUX-EN-Y PROCEDURE  2007  . SHOULDER ARTHROSCOPY WITH SUBACROMIAL DECOMPRESSION Right 04/17/2015   Procedure: SHOULDER ARTHROSCOPY ROTATOR CUFF DEBRIDEMENT WITH SUBACROMIAL DECOMPRESSION;  Surgeon: Jones BroomJustin Chandler, MD;  Location: [facility];  Service: Orthopedics;  Laterality: Right;  . TONSILLECTOMY    . wisdom teeth extractions      Family History:  Family History  Problem Relation Age of Onset  . Cancer Mother   . Peripheral Artery Disease Father   . Heart attack Maternal Grandfather     Social History:  reports that he has never smoked. He has never used smokeless tobacco. He reports  current alcohol use. He reports previous drug use.  Additional Social History:  Alcohol / Drug Use Pain Medications: Please see MAR Prescriptions: Please see MAR Over the Counter: Please see MAR History of alcohol / drug use?: Yes Longest period of sobriety (when/how long): Unknown Negative Consequences of Use: Personal relationships Withdrawal Symptoms: (Denies) Substance #1 Name of Substance 1: EtOH 1 - Age  of First Use: Unknown 1 - Amount (size/oz): 1/5 liquor 1 - Frequency: Daily 1 - Duration: Unknown 1 - Last Use / Amount: 04/17/18 Unknown amount  CIWA: CIWA-Ar BP: (!) 177/114 Pulse Rate: 66 COWS:    Allergies: No Known Allergies  Home Medications: (Not in a hospital admission)   OB/GYN Status:  No LMP for male patient.  General Assessment Data Location of Assessment: [facility] TTS Assessment: In system Is this a Tele or Face-to-Face Assessment?: Face-to-Face Is this an Initial Assessment or a Re-assessment for this encounter?: Initial Assessment Patient Accompanied by:: (NA) Language Other than English: No Living Arrangements: Other (Comment)(Homeless) What gender do you identify as?: Male Marital status: Married Jordan name: NA Pregnancy Status: No Living Arrangements: Other (Comment)(Homeless) Can pt return to current living arrangement?: Yes Admission Status: Voluntary Is patient capable of signing voluntary admission?: Yes Referral Source: Self/Family/Friend Insurance type: [place] [employer]  Medical Screening Exam [facility] Walk-in ONLY) Medical Exam completed: Yes  Crisis Care Plan Living Arrangements: Other (Comment)(Homeless) Legal Guardian: (NA) Name of Psychiatrist: Evelene Croon MD Name of Therapist: None  Education Status Is patient currently in school?: No Is the patient employed, unemployed or receiving disability?: Unemployed  Risk to self with the past 6 months Suicidal Ideation: Yes-Currently Present Has patient been a risk to self within the past 6 months prior to admission? : No Suicidal Intent: Yes-Currently Present Has patient had any suicidal intent within the past 6 months prior to admission? : No Is patient at risk for suicide?: Yes Suicidal Plan?: Yes-Currently Present Has patient had any suicidal plan within the past 6 months prior to admission? : No Specify Current Suicidal Plan: Jump into "some water"  Access to Means: No What has been your use  of drugs/alcohol within the last 12 months?: Current use Previous Attempts/Gestures: No How many times?: 0 Other Self Harm Risks: (NA) Triggers for Past Attempts: Unknown Intentional Self Injurious Behavior: None Family Suicide History: No Recent stressful life event(s): Other (Comment)(Excessive SA use) Persecutory voices/beliefs?: No Depression: Yes Depression Symptoms: Fatigue, Guilt, Feeling worthless/self pity Substance abuse history and/or treatment for substance abuse?: Yes Suicide prevention information given to non-admitted patients: Not applicable  Risk to Others within the past 6 months Homicidal Ideation: No Does patient have any lifetime risk of violence toward others beyond the six months prior to admission? : No Thoughts of Harm to Others: No Current Homicidal Intent: No Current Homicidal Plan: No Access to Homicidal Means: No Identified Victim: NA History of harm to others?: No Assessment of Violence: None Noted Violent Behavior Description: NA Does patient have access to weapons?: No Criminal Charges Pending?: No Does patient have a court date: No Is patient on probation?: No  Psychosis Hallucinations: None noted Delusions: None noted  Mental Status Report Appearance/Hygiene: Unremarkable Eye Contact: Fair Motor Activity: Freedom of movement Speech: Logical/coherent Level of Consciousness: Alert Mood: Depressed Affect: Anxious Anxiety Level: Minimal Thought Processes: Coherent, Relevant Judgement: Partial Orientation: Person, Place, Time Obsessive Compulsive Thoughts/Behaviors: None  Cognitive Functioning Concentration: Decreased Memory: Recent Intact, Remote Intact Is patient IDD: No Insight: Fair Impulse Control: Fair Appetite:  Good Have you had any weight changes? : No Change Sleep: No Change Total Hours of Sleep: 8 Vegetative Symptoms: None  ADLScreening [facility] Assessment Services) Patient's cognitive ability adequate to safely complete  daily activities?: Yes Patient able to express need for assistance with ADLs?: Yes Independently performs ADLs?: Yes (appropriate for developmental age)  Prior Inpatient Therapy Prior Inpatient Therapy: Yes Prior Therapy Dates: 2019 Prior Therapy Facilty/Provider(s): [facility], [facility] Reason for Treatment: MH issues  Prior Outpatient Therapy Prior Outpatient Therapy: Yes Prior Therapy Dates: 2017 Prior Therapy Facilty/Provider(s): Unknown Reason for Treatment: SA isues Does patient have an ACCT team?: No Does patient have Intensive In-House Services?  : No Does patient have [facility] services? : No Does patient have P4CC services?: No  ADL Screening (condition at time of admission) Patient's cognitive ability adequate to safely complete daily activities?: Yes Is the patient deaf or have difficulty hearing?: No Does the patient have difficulty seeing, even when wearing glasses/contacts?: No Does the patient have difficulty concentrating, remembering, or making decisions?: No Patient able to express need for assistance with ADLs?: Yes Does the patient have difficulty dressing or bathing?: No Independently performs ADLs?: Yes (appropriate for developmental age) Does the patient have difficulty walking or climbing stairs?: No Weakness of Legs: None Weakness of Arms/Hands: None  Home Assistive Devices/Equipment Home Assistive Devices/Equipment: None  Therapy Consults (therapy consults require a physician order) PT Evaluation Needed: No OT Evalulation Needed: No SLP Evaluation Needed: No Abuse/Neglect Assessment (Assessment to be complete while patient is alone) Physical Abuse: Denies Verbal Abuse: Denies Sexual Abuse: Denies Exploitation of patient/patient's resources: Denies Self-Neglect: Denies Values / Beliefs Cultural Requests During Hospitalization: None Spiritual Requests During Hospitalization: None Consults Spiritual Care Consult Needed: No Social Work Consult Needed:  No Merchant navy officer (For Healthcare) Does Patient Have a Medical Advance Directive?: No Would patient like information on creating a medical advance directive?: No - Patient declined          Disposition: Case was staffed with Arville Care FNP who recommended patient be observed and monitored for safety.   Disposition Initial Assessment Completed for this Encounter: Yes Disposition of Patient: (Observe and monitor) Patient refused recommended treatment: No Mode of transportation if patient is discharged/movement?: (Unk)  On Site Evaluation by:   Reviewed with Physician:    Alfredia Ferguson 04/18/2018 6:07 PM

## 2018-04-19 DIAGNOSIS — F1024 Alcohol dependence with alcohol-induced mood disorder: Secondary | ICD-10-CM | POA: Diagnosis present

## 2018-04-19 DIAGNOSIS — Z590 Homelessness: Secondary | ICD-10-CM

## 2018-04-19 DIAGNOSIS — F3289 Other specified depressive episodes: Secondary | ICD-10-CM

## 2018-04-19 NOTE — ED Notes (Signed)
Patient is alert, oriented and verbal. Patient states he is having SI thoughts and continues to have them but contracts for safety while on unit. Patient commented that "I know when I leave it's going to be the same thing out there." Patient provided support and encouragement. Q 15 minute checks in progress and patient remains safe on unit. Monitoring continues.

## 2018-04-19 NOTE — Consult Note (Addendum)
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 Pt denies suicidal/homicidal ideation, denies auditory/visual hallucinations and does not appear to be responding to internal stimuli. Pt has a history of alcohol abuse and his BAL on admission was 243, UDS negative. He stated he is scared and he knows he needs help. He has an appointment tomorrow for screening to get into [facility], if accepted he can go there Tuesday. He was inpatient at [facility] from 3-5 to 3-9 and went to stay with a friend where there was alcohol. He has been in the emergency room 3 times in the past 2 days. He is homeless. He has a wife and children in [place] but he can not stay there due to his drinking. He has been calm and cooperative in the emergency room, he is not exhibiting any symptoms of alcohol withdrawal. He will be seen by Peer Support and then discharged. Pt is psychiatrically clear.   Total Time spent with patient: 30 minutes  Past Psychiatric History: As above  Past Medical History:  Past Medical History:  Diagnosis Date  . Alcohol abuse   . Anxiety   . Depression   . Essential hypertension 11/08/2015  . Homelessness   . Hypertension   . Obesity (BMI 30.0-34.9) 11/08/2015  . Sinus bradycardia 11/08/2015  . Suicidal ideation     Past Surgical History:  Procedure Laterality Date  . CHOLECYSTECTOMY    . GASTRIC BYPASS     2006 - done at [facility]  . ROTATOR CUFF REPAIR  2017  . ROUX-EN-Y PROCEDURE  2007  . SHOULDER ARTHROSCOPY WITH SUBACROMIAL DECOMPRESSION Right 04/17/2015   Procedure: SHOULDER ARTHROSCOPY ROTATOR CUFF DEBRIDEMENT WITH SUBACROMIAL DECOMPRESSION;  Surgeon: Jones Broom, MD;  Location: [facility];  Service: Orthopedics;  Laterality: Right;  . TONSILLECTOMY    . wisdom teeth extractions     Family History:  Family History  Problem Relation Age of Onset  . Cancer Mother   . Peripheral Artery Disease Father   . Heart attack Maternal Grandfather    Family Psychiatric  History: Pt did not give this information Social History:  Social History   Substance and Sexual Activity  Alcohol Use Yes   Comment: one fifth per day     Social History   Substance and Sexual Activity  Drug Use Not Currently    Social History   Socioeconomic History  . Marital status: Married    Spouse name: Not on file  . Number of children: Not on file  . Years of education: Not on file  . Highest education level: Not on file  Occupational History  . Not on file  Social Needs  . Financial resource strain: Not on file  . Food insecurity:    Worry: Not on file    Inability: Not on file  . Transportation needs:    Medical: Not on file    Non-medical: Not on file  Tobacco Use  . Smoking status: Never Smoker  . Smokeless tobacco: Never Used  Substance and Sexual Activity  . Alcohol use: Yes    Comment: one fifth per day  . Drug use: Not Currently  . Sexual activity: Not Currently  Lifestyle  .  Physical activity:    Days per week: Not on file    Minutes per session: Not on file  . Stress: Not on file  Relationships  . Social connections:    Talks on phone: Not on file    Gets together: Not on file    Attends religious service: Not on file    Active member of club or organization: Not on file    Attends meetings of clubs or organizations: Not on file    Relationship status: Not on file  Other Topics Concern  . Not on file  Social History Narrative  . Not on file    Has this patient used any form of tobacco in the last 30 days? (Cigarettes, Smokeless Tobacco, Cigars, and/or Pipes) Prescription not provided because: Pt soes not use  tobacco  Current Medications: Current Facility-Administered Medications  Medication Dose Route Frequency Provider Last Rate Last Dose  . LORazepam (ATIVAN) injection 0-4 mg  0-4 mg Intravenous Q6H Curatolo, Adam, DO       Or  . LORazepam (ATIVAN) tablet 0-4 mg  0-4 mg Oral Q6H Curatolo, Adam, DO   1 mg at 04/19/18 1133  . [START ON 04/21/2018] LORazepam (ATIVAN) injection 0-4 mg  0-4 mg Intravenous Q12H Curatolo, Adam, DO       Or  . Melene Muller[START ON 04/21/2018] LORazepam (ATIVAN) tablet 0-4 mg  0-4 mg Oral Q12H Curatolo, Adam, DO      . thiamine (VITAMIN B-1) tablet 100 mg  100 mg Oral Daily Curatolo, Adam, DO   100 mg at 04/19/18 [id number]   Or  . thiamine (B-1) injection 100 mg  100 mg Intravenous Daily Curatolo, Adam, DO       Current Outpatient Medications  Medication Sig Dispense Refill  . acamprosate (CAMPRAL) 333 MG tablet Take 2 tablets (666 mg total) by mouth 3 (three) times daily. For alcohol cravings 180 tablet 0  . DULoxetine (CYMBALTA) 30 MG capsule Take 1 capsule (30 mg total) by mouth daily. For mood 30 capsule 0  . gabapentin (NEURONTIN) 300 MG capsule Take 1 capsule (300 mg total) by mouth 3 (three) times daily. For pain 90 capsule 0  . hydrOXYzine (ATARAX/VISTARIL) 25 MG tablet Take 1 tablet (25 mg total) by mouth at bedtime as needed and may repeat dose one time if needed (INSOMNIA). 30 tablet 0  . Multiple Vitamin (MULTIVITAMIN WITH MINERALS) TABS tablet Take 1 tablet by mouth daily. Vitamin supplement (Patient not taking: Reported on 04/08/2018)    . ondansetron (ZOFRAN ODT) 4 MG disintegrating tablet Take 1 tablet (4 mg total) by mouth every 8 (eight) hours as needed for nausea or vomiting. (Patient not taking: Reported on 04/18/2018) 10 tablet 0     Musculoskeletal: Strength & Muscle Tone: within normal limits Gait & Station: normal Patient leans: N/A  Psychiatric Specialty Exam: Physical Exam  Constitutional: He is oriented to person, place, and time. He appears  well-developed and well-nourished.  HENT:  Head: Normocephalic.  Respiratory: Effort normal.  Musculoskeletal: Normal range of motion.  Neurological: He is alert and oriented to person, place, and time.  Psychiatric: His speech is normal and behavior is normal. Judgment and thought content normal. His mood appears anxious. Cognition and memory are normal. He exhibits a depressed mood.    Review of Systems  Psychiatric/Behavioral: Positive for depression and substance abuse. The patient is nervous/anxious.   All other systems reviewed and are negative.   Blood pressure (!) 173/89, pulse 62, temperature 98.6 F (37  C), temperature source Oral, resp. rate 16, height 6\' 1"  (1.854 m), weight 104.3 kg, SpO2 97 %.Body mass index is 30.34 kg/m.  General Appearance: Casual  Eye Contact:  Good  Speech:  Clear and Coherent and Normal Rate  Volume:  Normal  Mood:  Anxious, Depressed and Hopeless  Affect:  Congruent and Depressed  Thought Process:  Coherent, Linear and Descriptions of Associations: Intact  Orientation:  Full (Time, Place, and Person)  Thought Content:  Logical  Suicidal Thoughts:  No  Homicidal Thoughts:  No  Memory:  Immediate;   Good Recent;   Good Remote;   Fair  Judgement:  Fair  Insight:  Fair  Psychomotor Activity:  Normal  Concentration:  Concentration: Good  Recall:  Good  Fund of Knowledge:  Good  Language:  Good  Akathisia:  No  Handed:  Right  AIMS (if indicated):     Assets:  Communication Skills Social Support  ADL's:  Intact  Cognition:  WNL  Sleep:        Demographic Factors:  Male, Caucasian and Unemployed  Loss Factors: Financial problems/change in socioeconomic status  Historical Factors: Family history of mental illness or substance abuse  Risk Reduction Factors:   Sense of responsibility to family  Continued Clinical Symptoms:  Alcohol/Substance Abuse/Dependencies  Cognitive Features That Contribute To Risk:  Closed-mindedness     Suicide Risk:  Minimal: No identifiable suicidal ideation.  Patients presenting with no risk factors but with morbid ruminations; may be classified as minimal risk based on the severity of the depressive symptoms    Plan Of Care/Follow-up recommendations:  Activity:  as tolerated Diet:  Heart healthy  Disposition and Treatment Plan: Take all medications as prescribed by your outpatient provider . Keep all follow-up appointments as scheduled.  Do not consume alcohol or use illegal drugs while on prescription medications. Report any adverse effects from your medications to your primary care provider promptly.  In the event of recurrent symptoms or worsening symptoms, call 911, a crisis hotline, or go to the nearest emergency department for evaluation.   Laveda Abbe, NP 04/19/2018, 12:04 PM  Patient seen face-to-face for psychiatric evaluation, chart reviewed and case discussed with the physician extender and developed treatment plan. Reviewed the information documented and agree with the treatment plan. Thedore Mins, MD

## 2018-04-19 NOTE — Discharge Instructions (Signed)
For your mental health needs, please follow up at:   [facility] [address] [place], [place] [address] Phone: [phone] Fax: [phone] E-mail: [email or web address]

## 2018-04-19 NOTE — ED Notes (Signed)
Patient alert, oriented, verbal and ambulatory at discharge. Patient discharge home with follow-up to [facility]. AVS/Follow-up instructions reviewed with patient and he verbalized understanding and written copy given to patient. All patient belongings returned to him and he verified all  Belongings present. Patient denies SI/HI and A/V hallucinations at discharge. Patient without questions at this time. Patient escorted off unit to lobby by staff. Patient vitals at discharge [id number] 5 room air.

## 2018-04-19 NOTE — Patient Outreach (Signed)
CPSS met with the patient to provide substance use recovery support with lived recovery experience from [place] and help with substance use treatment resources. Patient has a history of alcohol addiction. Patient was recently discharged from [facility] on [facility]18 and was living at [facility] halfway house. Patient plans to follow up with the [facility] and has an interview tomorrow in order to get into their residential substance use treatment program. Patient plans to follow up with with AA meetings today after [facility] the [facility]. CPSS provided an AA meeting list, [employer] bus passes, [employer] application, and [facility]PSS contact information. CPSS strongly encouraged the patient to follow up with [facility]PSS if needed after discharge from the [facility] for further substance use recovery support or help with getting connected to substance use treatment resources.

## 2018-12-23 IMAGING — CT CT HEAD W/O CM
3 of 8 series · 13 of 47 positions shown, 16 images · non-contrast
Comparison: None.

CLINICAL DATA: Status post fall. Hit head, with concern for head or
cervical spine injury. Initial encounter.

EXAM:
CT HEAD WITHOUT CONTRAST
CT CERVICAL SPINE WITHOUT CONTRAST
TECHNIQUE: Multidetector CT imaging of the head and cervical spine was
performed following the standard protocol without intravenous
contrast. Multiplanar CT image reconstructions of the cervical spine
were also generated.

[Series 10: sagittal · sagittal · 0.35mm/px · 2 of 49 slices shown]
[im 17/49  brain]
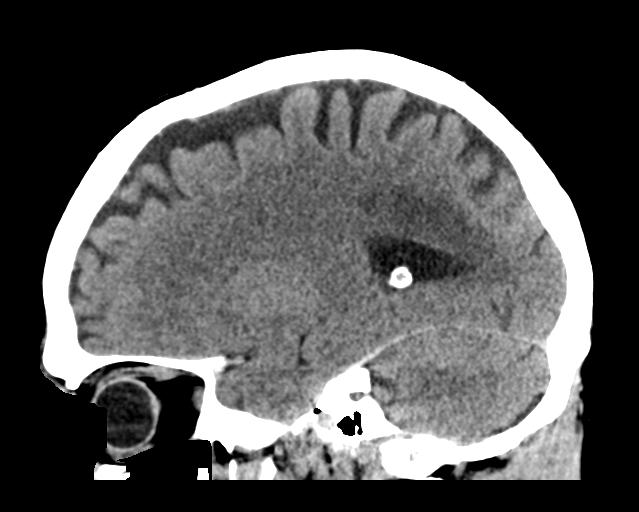
[im 33/49  brain]
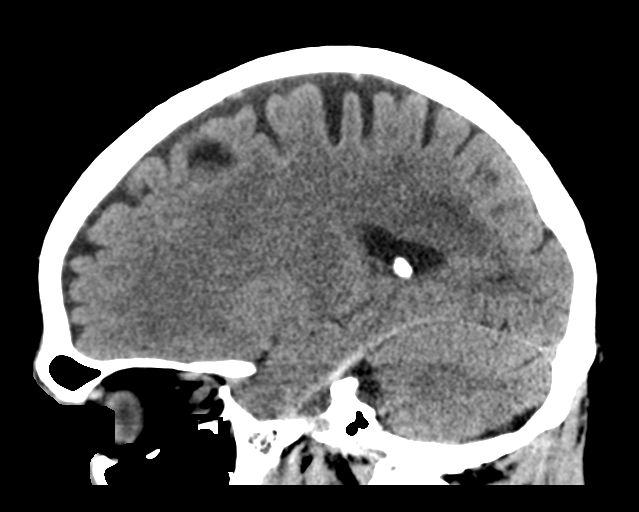

[Series 11: axial recon · axial · 0.23mm/px · z∈[-348,-214]mm · 9 of 101 slices shown, 12 images]
[im 11/101  brain]
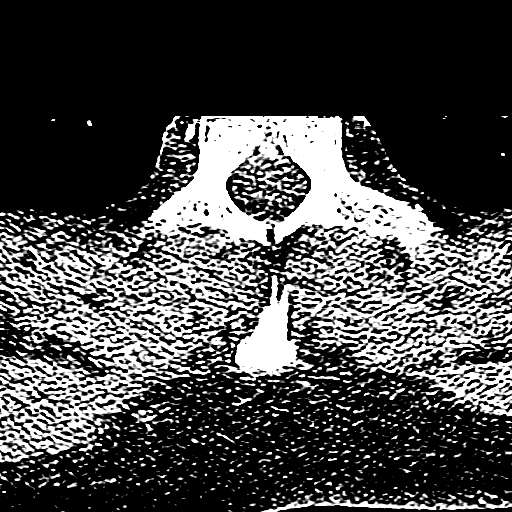
[im 11/101  bone]
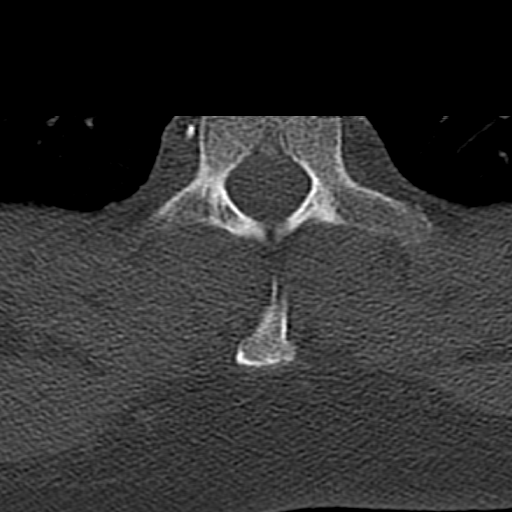
[im 21/101  brain]
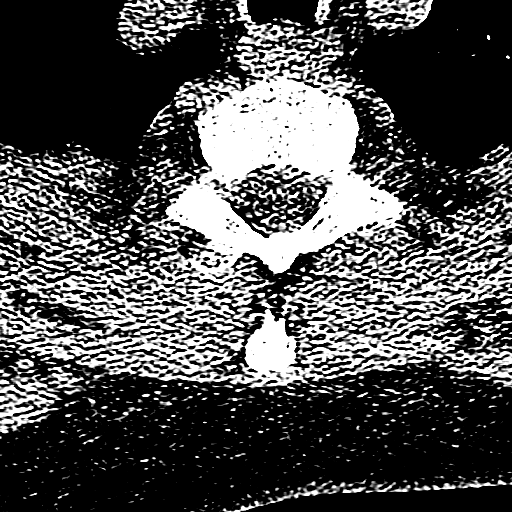
[im 31/101  brain]
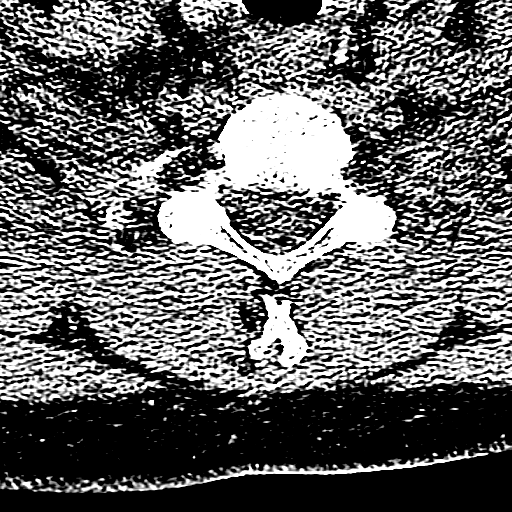
[im 41/101  brain]
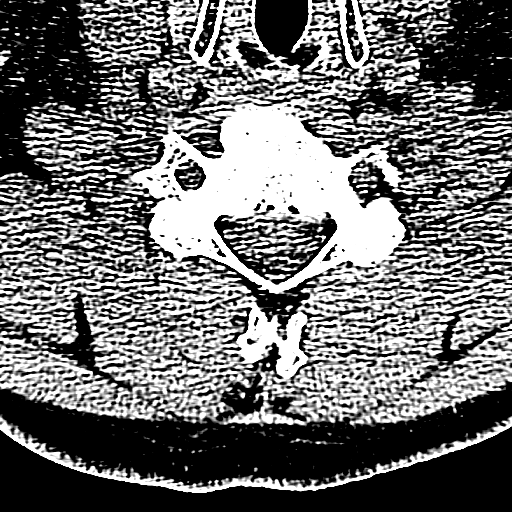
[im 51/101  brain]
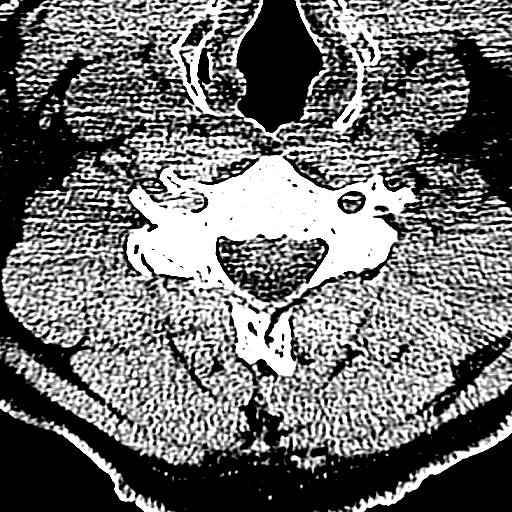
[im 51/101  bone]
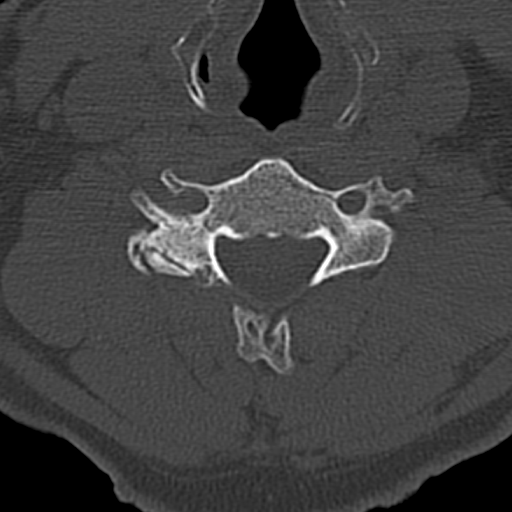
[im 61/101  brain]
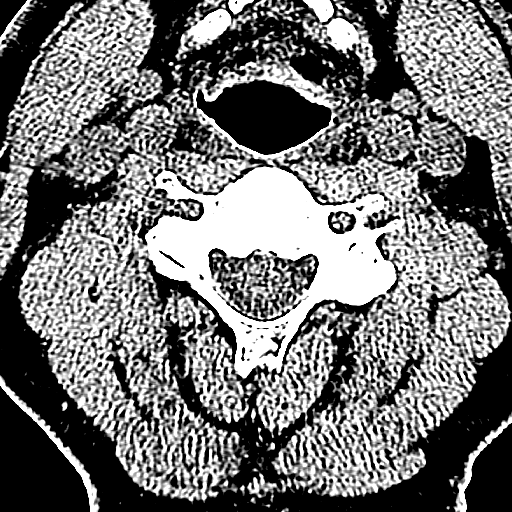
[im 71/101  brain]
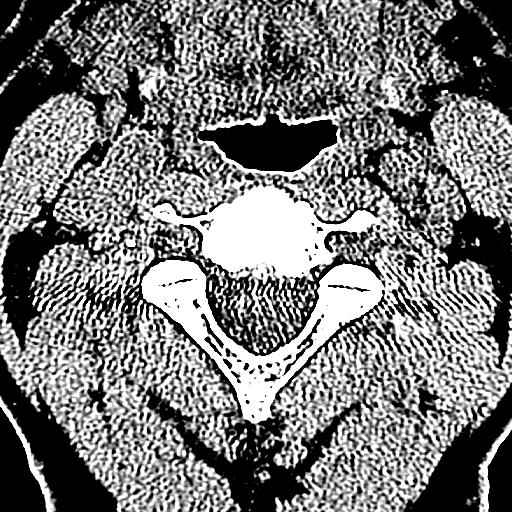
[im 81/101  brain]
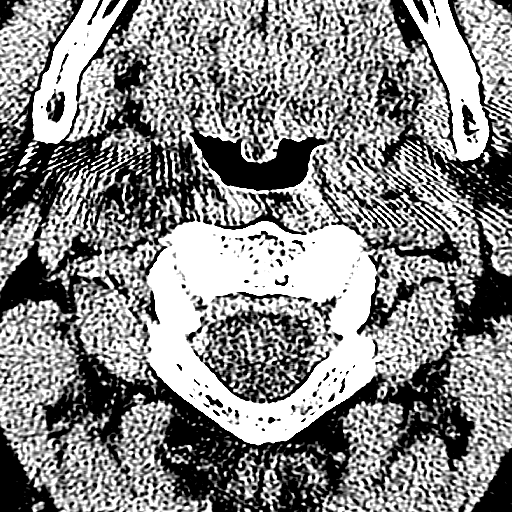
[im 91/101  brain]
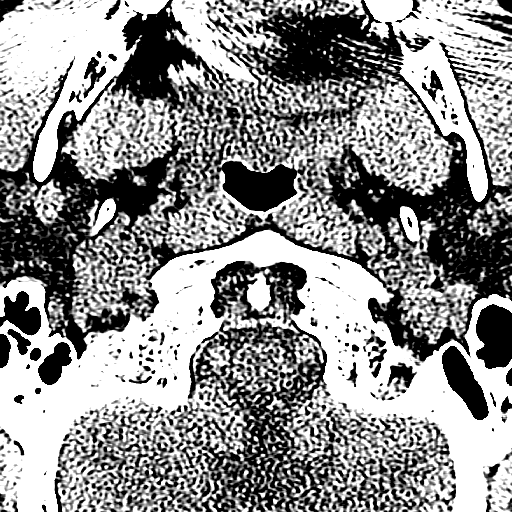
[im 91/101  bone]
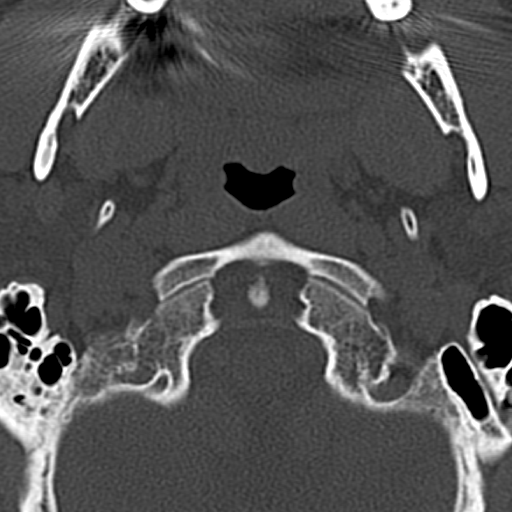

[Series 12: coronal · coronal · 0.27mm/px · 2 of 44 slices shown]
[im 15/44  brain]
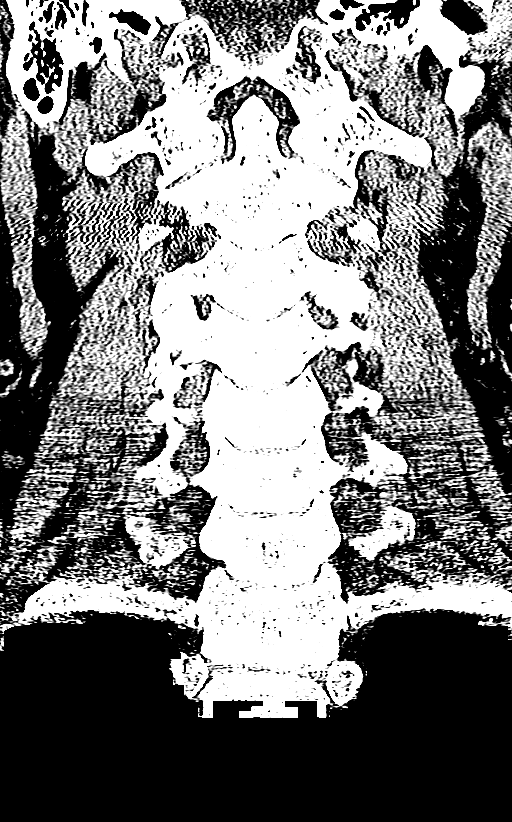
[im 29/44  brain]
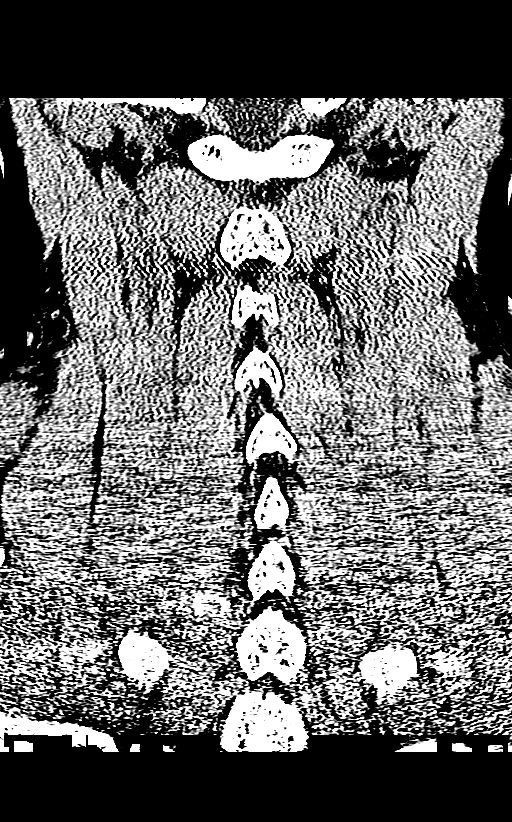

[13 of 47 positions shown; findings below may reference images not displayed]

FINDINGS: CT HEAD FINDINGS

Brain: No evidence of acute infarction, hemorrhage, hydrocephalus,
extra-axial collection or mass lesion/mass effect.

Prominence of the ventricles and sulci reflects mild cortical volume
loss. Mild subcortical white matter change likely reflects small
vessel ischemic microangiopathy.

The posterior fossa, including the cerebellum, brainstem and fourth
ventricle, is within normal limits. The third and lateral
ventricles, and basal ganglia are unremarkable in appearance. The
cerebral hemispheres are symmetric in appearance, with normal
gray-white differentiation. No mass effect or midline shift is seen.

Vascular: No hyperdense vessel or unexpected calcification.

Skull: There is no evidence of fracture; visualized osseous
structures are unremarkable in appearance.

Sinuses/Orbits: The visualized portions of the orbits are within
normal limits. The paranasal sinuses and mastoid air cells are
well-aerated.

Other: No significant soft tissue abnormalities are seen.

CT CERVICAL SPINE FINDINGS

Alignment: Normal.

Skull base and vertebrae: No acute fracture. No primary bone lesion
or focal pathologic process.

Soft tissues and spinal canal: No prevertebral fluid or swelling. No
visible canal hematoma.

Disc levels: Minimal intervertebral disc space narrowing is noted
along the lower cervical spine, with anterior disc osteophyte
complexes. Mild facet disease is noted along the cervical spine,
particularly on the right at C4-C5.

Upper chest: The visualized lung apices are clear. The thyroid gland
is unremarkable.

Other: No additional soft tissue abnormalities are seen.
IMPRESSION: 1. No evidence of traumatic intracranial injury or fracture.
2. No evidence fracture or subluxation along the cervical spine.
3. Mild cortical volume loss and scattered small vessel ischemic
microangiopathy.
4. Minimal degenerative change along the cervical spine.

## 2023-03-25 ENCOUNTER — Other Ambulatory Visit: Payer: Self-pay | Admitting: Cardiovascular Disease
